# Patient Record
Sex: Female | Born: 1995 | Race: White | Hispanic: No | Smoking: Former smoker
Health system: Southern US, Community
[De-identification: ages and names within clinical notes are randomized; demographics above are authoritative.]

## PROBLEM LIST (undated history)

## (undated) ENCOUNTER — Inpatient Hospital Stay: Payer: Self-pay

## (undated) DIAGNOSIS — J309 Allergic rhinitis, unspecified: Secondary | ICD-10-CM

## (undated) DIAGNOSIS — F17210 Nicotine dependence, cigarettes, uncomplicated: Secondary | ICD-10-CM

## (undated) DIAGNOSIS — F329 Major depressive disorder, single episode, unspecified: Secondary | ICD-10-CM

## (undated) DIAGNOSIS — I10 Essential (primary) hypertension: Secondary | ICD-10-CM

## (undated) DIAGNOSIS — G4700 Insomnia, unspecified: Secondary | ICD-10-CM

## (undated) DIAGNOSIS — F419 Anxiety disorder, unspecified: Secondary | ICD-10-CM

## (undated) DIAGNOSIS — N939 Abnormal uterine and vaginal bleeding, unspecified: Secondary | ICD-10-CM

## (undated) DIAGNOSIS — Z720 Tobacco use: Secondary | ICD-10-CM

## (undated) DIAGNOSIS — D649 Anemia, unspecified: Secondary | ICD-10-CM

## (undated) DIAGNOSIS — F32A Depression, unspecified: Secondary | ICD-10-CM

## (undated) DIAGNOSIS — F4322 Adjustment disorder with anxiety: Secondary | ICD-10-CM

## (undated) HISTORY — DX: Insomnia, unspecified: G47.00

## (undated) HISTORY — DX: Adjustment disorder with anxiety: F43.22

## (undated) HISTORY — DX: Nicotine dependence, cigarettes, uncomplicated: F17.210

## (undated) HISTORY — DX: Abnormal uterine and vaginal bleeding, unspecified: N93.9

## (undated) HISTORY — DX: Depression, unspecified: F32.A

## (undated) HISTORY — DX: Major depressive disorder, single episode, unspecified: F32.9

## (undated) HISTORY — PX: NO PAST SURGERIES: SHX2092

## (undated) HISTORY — PX: WISDOM TOOTH EXTRACTION: SHX21

## (undated) HISTORY — DX: Allergic rhinitis, unspecified: J30.9

## (undated) HISTORY — DX: Tobacco use: Z72.0

## (undated) HISTORY — DX: Anxiety disorder, unspecified: F41.9

---
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
## 2015-01-27 ENCOUNTER — Telehealth: Payer: Self-pay | Admitting: Obstetrics and Gynecology

## 2015-01-27 NOTE — Telephone Encounter (Signed)
Please inform pt that she should have refills on her Depo, she just needs to call the pharmacy. I sent RX in on 10/06/2014 with 3 refills. Thanks

## 2015-01-27 NOTE — Telephone Encounter (Signed)
Patient called requesting [facility] dep[facility] [address].Thanks

## 2015-01-28 NOTE — Telephone Encounter (Signed)
Patient notified

## 2015-02-05 ENCOUNTER — Telehealth: Payer: Self-pay | Admitting: Obstetrics and Gynecology

## 2015-02-05 NOTE — Telephone Encounter (Signed)
PT CALLED AND SHE HAD BEEN ON DEPO FOR ABOUT TWO YEARS, AND ABOUT A MONTH AGO SHE STARTED SPOTTING AND IT STOPPED AND SHE STARTED BLEEDING REALLY HEAVY TODAY 02/05/15, AND SHE IS NOT SCHEDULE TO COME IN UNTIL 7/18, SHE TOOK PREGNANCY TEST AND ITS NEGATIVE, AND SHE IS JUST CONCERNED BECAUSE THIS HAS STARTED ALL OF A SUDDEN. PT WOULD LIKE A CALL BACK.

## 2015-02-11 NOTE — Telephone Encounter (Signed)
Called and left several messages. Pt has yet to return my call.

## 2015-02-16 ENCOUNTER — Ambulatory Visit (INDEPENDENT_AMBULATORY_CARE_PROVIDER_SITE_OTHER): Payer: Medicaid Other | Admitting: Obstetrics and Gynecology

## 2015-02-16 VITALS — BP 113/76 | HR 78 | Ht 61.5 in | Wt 124.3 lb

## 2015-02-16 DIAGNOSIS — F418 Other specified anxiety disorders: Secondary | ICD-10-CM | POA: Diagnosis not present

## 2015-02-16 DIAGNOSIS — Z3042 Encounter for surveillance of injectable contraceptive: Secondary | ICD-10-CM

## 2015-02-16 DIAGNOSIS — F329 Major depressive disorder, single episode, unspecified: Secondary | ICD-10-CM

## 2015-02-16 DIAGNOSIS — F32A Depression, unspecified: Secondary | ICD-10-CM | POA: Insufficient documentation

## 2015-02-16 DIAGNOSIS — F419 Anxiety disorder, unspecified: Secondary | ICD-10-CM

## 2015-02-16 MED ORDER — MEDROXYPROGESTERONE ACETATE 150 MG/ML IM SUSP
150.0000 mg | Freq: Once | INTRAMUSCULAR | Status: AC
  Administered 2015-02-16: 150 mg via INTRAMUSCULAR

## 2015-02-16 NOTE — Patient Instructions (Signed)
Pt to f/u in 3 months (October 3-17th)

## 2015-02-16 NOTE — Progress Notes (Cosign Needed)
Last Depo-Provera: 5/282016 Side Effects if any: Pt notes some spotting x 1 day, advised pt this was normal with Depo-Provera Serum HCG indicated? No, pt within allotted window Depo-Provera 150 mg IM given by: Debbe [place] Hildur Bayer, CMA (RT Buttocks) Next appointment due October 3- October 17 Pt tolerated injection well. Pt supplied.

## 2015-03-06 NOTE — Progress Notes (Signed)
I have reviewed the documentation and agree with assessment.

## 2015-03-19 ENCOUNTER — Encounter: Payer: Self-pay | Admitting: Emergency Medicine

## 2015-03-19 ENCOUNTER — Ambulatory Visit
Admission: EM | Admit: 2015-03-19 | Discharge: 2015-03-19 | Disposition: A | Discharge status: Home / Self Care | Payer: Medicaid Other | Attending: Family Medicine | Admitting: Family Medicine

## 2015-03-19 DIAGNOSIS — B084 Enteroviral vesicular stomatitis with exanthem: Secondary | ICD-10-CM | POA: Insufficient documentation

## 2015-03-19 DIAGNOSIS — J029 Acute pharyngitis, unspecified: Secondary | ICD-10-CM

## 2015-03-19 DIAGNOSIS — R21 Rash and other nonspecific skin eruption: Secondary | ICD-10-CM | POA: Diagnosis present

## 2015-03-19 LAB — RAPID STREP SCREEN (MED CTR MEBANE ONLY): STREPTOCOCCUS, GROUP A SCREEN (DIRECT): NEGATIVE

## 2015-03-19 NOTE — Discharge Instructions (Signed)

## 2015-03-19 NOTE — ED Notes (Signed)
Pt with blisters on hands and feet

## 2015-03-19 NOTE — ED Provider Notes (Signed)
CSN: [id number]     Arrival date & time 03/19/15  1116 History   First MD Initiated Contact with Patient 03/19/15 1144     Chief Complaint  Patient presents with  . Blister   Patient states being exposed to her significant other to young nephews who have had foot mouth disease recently. (Consider location/radiation/quality/duration/timing/severity/associated sxs/prior Treatment) Patient is a 19 y.o. female presenting with rash. The history is provided by the patient and a friend. No language interpreter was used.  Rash Location:  Foot, hand and mouth Mouth rash location:  Lower gingiva Hand rash location:  R palm Foot rash location:  Sole of L foot and sole of R foot Quality: blistering, painful, redness and swelling   Quality: not scaling   Pain details:    Quality:  Sharp   Onset quality:  Sudden   Duration:  3 days   Timing:  Constant   Progression:  Worsening Onset quality:  Gradual Duration:  3 days Timing:  Constant Progression:  Worsening Chronicity:  New Context: sick contacts   Context: not hot tub use and not plant contact   Relieved by:  Nothing Worsened by:  Nothing tried Associated symptoms: sore throat and throat swelling   Associated symptoms: no abdominal pain      Past Medical History  Diagnosis Date  . Anxiety   . Depression   . Cigarette smoker    Past Surgical History  Procedure Laterality Date  . Wisdom tooth extraction     Family History  Problem Relation Age of Onset  . Lupus Mother   . Diabetes Father   . Breast cancer Maternal Grandmother   . Diabetes Paternal Grandmother   . Diabetes Paternal Grandfather    Social History  Substance Use Topics  . Smoking status: Current Every Day Smoker -- 0.50 packs/day for 1 years    Types: Cigarettes  . Smokeless tobacco: Never Used  . Alcohol Use: None   OB History    Gravida Para Term Preterm AB TAB SAB Ectopic Multiple Living       Review of Systems  HENT: Positive  for sore throat.   Gastrointestinal: Negative for abdominal pain.  Skin: Positive for rash.  All other systems reviewed and are negative.  Patient denies any medical problems but does smoke and was warned that she needs to stop smoking. Allergies  Review of patient's allergies indicates no known allergies.  Home Medications   Prior to Admission medications   Medication Sig Start Date End Date Taking? Authorizing Provider  escitalopram (LEXAPRO) 20 MG tablet Take 20 mg by mouth daily.   Yes Historical Provider, MD  medroxyPROGESTERone (DEPO-PROVERA) 150 MG/ML injection Inject 150 mg into the muscle every 3 (three) months.    Historical Provider, MD   BP 134/94 mmHg  Pulse 87  Temp(Src) 98.4 F (36.9 C) (Tympanic)  Resp 18  Ht  (1.575 m)  Wt 121 lb (54.885 kg)  BMI 22.13 kg/m2  SpO2 99% Physical Exam  Constitutional: She is oriented to person, place, and time. She appears well-developed and well-nourished.  HENT:  Head: Normocephalic.  Eyes: Pupils are equal, round, and reactive to light.  Neck: Normal range of motion. Neck supple.  Lymphadenopathy:    She has cervical adenopathy.  Neurological: She is alert and oriented to person, place, and time.  Skin: Skin is warm. Rash noted.  Psychiatric: She has a normal mood and affect. Her  behavior is normal.  Vitals reviewed.   ED Course  Procedures (including critical care time) Labs Review Labs Reviewed  RAPID STREP SCREEN (NOT AT [facility])  CULTURE, GROUP A STREP ([facility] ONLY)    Imaging Review No results found.   MDM   1. Hand, foot and mouth disease   2. Pharyngitis      Hand-foot-and-mouth disease Pharyngitis No antibodies will be prescribed unless strep culture is positive. Work note will be given for the next few days as well.    Hassan Rowan, MD 03/20/15 1450

## 2015-03-23 LAB — CULTURE, GROUP A STREP (THRC)

## 2015-05-15 DIAGNOSIS — F4322 Adjustment disorder with anxiety: Secondary | ICD-10-CM | POA: Insufficient documentation

## 2015-05-15 DIAGNOSIS — J309 Allergic rhinitis, unspecified: Secondary | ICD-10-CM | POA: Insufficient documentation

## 2015-05-15 DIAGNOSIS — Z720 Tobacco use: Secondary | ICD-10-CM | POA: Insufficient documentation

## 2015-05-15 DIAGNOSIS — G4700 Insomnia, unspecified: Secondary | ICD-10-CM | POA: Insufficient documentation

## 2015-05-15 DIAGNOSIS — R5383 Other fatigue: Secondary | ICD-10-CM | POA: Insufficient documentation

## 2015-05-19 ENCOUNTER — Ambulatory Visit: Payer: Medicaid Other

## 2015-06-02 ENCOUNTER — Encounter: Payer: Self-pay | Admitting: Family Medicine

## 2015-06-02 ENCOUNTER — Ambulatory Visit (INDEPENDENT_AMBULATORY_CARE_PROVIDER_SITE_OTHER): Payer: Self-pay | Admitting: Obstetrics and Gynecology

## 2015-06-02 ENCOUNTER — Ambulatory Visit (INDEPENDENT_AMBULATORY_CARE_PROVIDER_SITE_OTHER): Payer: Medicaid Other | Admitting: Family Medicine

## 2015-06-02 ENCOUNTER — Encounter: Payer: Self-pay | Admitting: Obstetrics and Gynecology

## 2015-06-02 VITALS — BP 120/83 | HR 98 | Ht 62.0 in | Wt 129.4 lb

## 2015-06-02 VITALS — BP 116/86 | HR 96 | Temp 98.2°F | Ht 62.0 in | Wt 126.0 lb

## 2015-06-02 DIAGNOSIS — Z23 Encounter for immunization: Secondary | ICD-10-CM | POA: Diagnosis not present

## 2015-06-02 DIAGNOSIS — R1013 Epigastric pain: Secondary | ICD-10-CM | POA: Diagnosis not present

## 2015-06-02 DIAGNOSIS — Z0000 Encounter for general adult medical examination without abnormal findings: Secondary | ICD-10-CM

## 2015-06-02 DIAGNOSIS — Z720 Tobacco use: Secondary | ICD-10-CM

## 2015-06-02 DIAGNOSIS — Z840 Family history of diseases of the skin and subcutaneous tissue: Secondary | ICD-10-CM

## 2015-06-02 DIAGNOSIS — Z828 Family history of other disabilities and chronic diseases leading to disablement, not elsewhere classified: Secondary | ICD-10-CM | POA: Diagnosis not present

## 2015-06-02 DIAGNOSIS — Z113 Encounter for screening for infections with a predominantly sexual mode of transmission: Secondary | ICD-10-CM

## 2015-06-02 DIAGNOSIS — Z3042 Encounter for surveillance of injectable contraceptive: Secondary | ICD-10-CM

## 2015-06-02 LAB — POCT URINE PREGNANCY: PREG TEST UR: NEGATIVE

## 2015-06-02 MED ORDER — MEDROXYPROGESTERONE ACETATE 150 MG/ML IM SUSP
150.0000 mg | Freq: Once | INTRAMUSCULAR | Status: AC
  Administered 2015-06-02: 150 mg via INTRAMUSCULAR

## 2015-06-02 MED ORDER — OMEPRAZOLE 20 MG PO CPDR: 20.0000 mg | DELAYED_RELEASE_CAPSULE | Freq: Every day | ORAL | Status: DC

## 2015-06-02 NOTE — Assessment & Plan Note (Signed)
Advised her to work on [employer] diet; avoid triggers; use PPI for just two weeks

## 2015-06-02 NOTE — Patient Instructions (Addendum)
DASH Eating Plan DASH stands for "Dietary Approaches to Stop Hypertension." The DASH eating plan is a healthy eating plan that has been shown to reduce high blood pressure (hypertension). Additional health benefits may include reducing the risk of type 2 diabetes mellitus, heart disease, and stroke. The DASH eating plan may also help with weight loss. WHAT DO I NEED TO KNOW ABOUT THE DASH EATING PLAN? For the DASH eating plan, you will follow these general guidelines:  Choose foods with a percent daily value for sodium of less than 5% (as listed on the food label).  Use salt-free seasonings or herbs instead of table salt or sea salt.  Check with your health care provider or pharmacist before using salt substitutes.  Eat lower-sodium products, often labeled as "lower sodium" or "no salt added."  Eat fresh foods.  Eat more vegetables, fruits, and low-fat dairy products.  Choose whole grains. Look for the word "whole" as the first word in the ingredient list.  Choose fish and skinless chicken or [place] more often than red meat. Limit fish, poultry, and meat to 6 oz (170 g) each day.  Limit sweets, desserts, sugars, and sugary drinks.  Choose heart-healthy fats.  Limit cheese to 1 oz (28 g) per day.  Eat more home-cooked food and less restaurant, buffet, and fast food.  Limit fried foods.  Cook foods using methods other than frying.  Limit canned vegetables. If you do use them, rinse them well to decrease the sodium.  When eating at a restaurant, ask that your food be prepared with less salt, or no salt if possible. WHAT FOODS CAN I EAT? Seek help from a dietitian for individual calorie needs. Grains Whole grain or whole wheat bread. Brown rice. Whole grain or whole wheat pasta. Quinoa, bulgur, and whole grain cereals. Low-sodium cereals. Corn or whole wheat flour tortillas. Whole grain cornbread. Whole grain crackers. Low-sodium crackers. Vegetables Fresh or frozen vegetables  (raw, steamed, roasted, or grilled). Low-sodium or reduced-sodium tomato and vegetable juices. Low-sodium or reduced-sodium tomato sauce and paste. Low-sodium or reduced-sodium canned vegetables.  Fruits All fresh, canned (in natural juice), or frozen fruits. Meat and Other Protein Products Ground beef (85% or leaner), grass-fed beef, or beef trimmed of fat. Skinless chicken or [place]. Ground chicken or [place]. Pork trimmed of fat. All fish and seafood. Eggs. Dried beans, peas, or lentils. Unsalted nuts and seeds. Unsalted canned beans. Dairy Low-fat dairy products, such as skim or 1% milk, 2% or reduced-fat cheeses, low-fat ricotta or cottage cheese, or plain low-fat yogurt. Low-sodium or reduced-sodium cheeses. Fats and Oils Tub margarines without trans fats. Light or reduced-fat mayonnaise and salad dressings (reduced sodium). Avocado. Safflower, olive, or canola oils. Natural peanut or almond butter. Other Unsalted popcorn and pretzels. The items listed above may not be a complete list of recommended foods or beverages. Contact your dietitian for more options. WHAT FOODS ARE NOT RECOMMENDED? Grains White bread. White pasta. White rice. Refined cornbread. Bagels and croissants. Crackers that contain trans fat. Vegetables Creamed or fried vegetables. Vegetables in a cheese sauce. Regular canned vegetables. Regular canned tomato sauce and paste. Regular tomato and vegetable juices. Fruits Dried fruits. Canned fruit in light or heavy syrup. Fruit juice. Meat and Other Protein Products Fatty cuts of meat. Ribs, chicken wings, bacon, sausage, bologna, salami, chitterlings, fatback, hot dogs, bratwurst, and packaged luncheon meats. Salted nuts and seeds. Canned beans with salt. Dairy Whole or 2% milk, cream, half-and-half, and cream cheese. Whole-fat or sweetened yogurt. Full-fat  cheeses or blue cheese. Nondairy creamers and whipped toppings. Processed cheese, cheese spreads, or cheese  curds. Condiments Onion and garlic salt, seasoned salt, table salt, and sea salt. Canned and packaged gravies. Worcestershire sauce. Tartar sauce. Barbecue sauce. Teriyaki sauce. Soy sauce, including reduced sodium. Steak sauce. Fish sauce. Oyster sauce. Cocktail sauce. Horseradish. Ketchup and mustard. Meat flavorings and tenderizers. Bouillon cubes. Hot sauce. Tabasco sauce. Marinades. Taco seasonings. Relishes. Fats and Oils Butter, stick margarine, lard, shortening, ghee, and bacon fat. Coconut, palm kernel, or palm oils. Regular salad dressings. Other Pickles and olives. Salted popcorn and pretzels. The items listed above may not be a complete list of foods and beverages to avoid. Contact your dietitian for more information. WHERE CAN I FIND MORE INFORMATION? National Heart, Lung, and [employer] Institute: www.nhlbi.nih.gov/health/health-topics/topics/dash/   This information is not intended to replace advice given to you by your health care provider. Make sure you discuss any questions you have with your health care provider.   Document Released: 07/07/2011 Document Revised: 08/08/2014 Document Reviewed: 05/22/2013 Elsevier Interactive Patient Education 2016 [employer] Health Maintenance, Female Adopting a healthy lifestyle and getting preventive care can go a long way to promote health and wellness. Talk with your health care provider about what schedule of regular examinations is right for you. This is a good chance for you to check in with your provider about disease prevention and staying healthy. In between checkups, there are plenty of things you can do on your own. Experts have done a lot of research about which lifestyle changes and preventive measures are most likely to keep you healthy. Ask your health care provider for more information. WEIGHT AND DIET  Eat a healthy diet  Be sure to include plenty of vegetables, fruits, low-fat dairy products, and lean protein.  Do not eat a  lot of foods high in solid fats, added sugars, or salt.  Get regular exercise. This is one of the most important things you can do for your health.  Most adults should exercise for at least 150 minutes each week. The exercise should increase your heart rate and make you sweat (moderate-intensity exercise).  Most adults should also do strengthening exercises at least twice a week. This is in addition to the moderate-intensity exercise.  Maintain a healthy weight  Body mass index (BMI) is a measurement that can be used to identify possible weight problems. It estimates body fat based on height and weight. Your health care provider can help determine your BMI and help you achieve or maintain a healthy weight.  For females 20 years of age and older:   A BMI below 18.5 is considered underweight.  A BMI of 18.5 to 24.9 is normal.  A BMI of 25 to 29.9 is considered overweight.  A BMI of 30 and above is considered obese.  Watch levels of cholesterol and blood lipids  You should start having your blood tested for lipids and cholesterol at 20 years of age, then have this test every 5 years.  You may need to have your cholesterol levels checked more often if:  Your lipid or cholesterol levels are high.  You are older than 19 years of age.  You are at high risk for heart disease.  CANCER SCREENING   Lung Cancer  Lung cancer screening is recommended for adults 55-80 years old who are at high risk for lung cancer because of a history of smoking.  A yearly low-dose CT scan of the lungs is recommended for people   who:  Currently smoke.  Have quit within the past 15 years.  Have at least a 30-pack-year history of smoking. A pack year is smoking an average of one pack of cigarettes a day for 1 year.  Yearly screening should continue until it has been 15 years since you quit.  Yearly screening should stop if you develop a health problem that would prevent you from having lung cancer  treatment.  Breast Cancer  Practice breast self-awareness. This means understanding how your breasts normally appear and feel.  It also means doing regular breast self-exams. Let your health care provider know about any changes, no matter how small.  If you are in your 20s or 30s, you should have a clinical breast exam (CBE) by a health care provider every 1-3 years as part of a regular health exam.  If you are 67 or older, have a CBE every year. Also consider having a breast X-ray (mammogram) every year.  If you have a family history of breast cancer, talk to your health care provider about genetic screening.  If you are at high risk for breast cancer, talk to your health care provider about having an MRI and a mammogram every year.  Breast cancer gene (BRCA) assessment is recommended for women who have family members with BRCA-related cancers. BRCA-related cancers include:  Breast.  Ovarian.  Tubal.  Peritoneal cancers.  Results of the assessment will determine the need for genetic counseling and BRCA1 and BRCA2 testing. Cervical Cancer Your health care provider may recommend that you be screened regularly for cancer of the pelvic organs (ovaries, uterus, and vagina). This screening involves a pelvic examination, including checking for microscopic changes to the surface of your cervix (Pap test). You may be encouraged to have this screening done every 3 years, beginning at age 19.  For women ages 19-65, health care providers may recommend pelvic exams and Pap testing every 3 years, or they may recommend the Pap and pelvic exam, combined with testing for human papilloma virus (HPV), every 5 years. Some types of HPV increase your risk of cervical cancer. Testing for HPV may also be done on women of any age with unclear Pap test results.  Other health care providers may not recommend any screening for nonpregnant women who are considered low risk for pelvic cancer and who do not have  symptoms. Ask your health care provider if a screening pelvic exam is right for you.  If you have had past treatment for cervical cancer or a condition that could lead to cancer, you need Pap tests and screening for cancer for at least 20 years after your treatment. If Pap tests have been discontinued, your risk factors (such as having a new sexual partner) need to be reassessed to determine if screening should resume. Some women have medical problems that increase the chance of getting cervical cancer. In these cases, your health care provider may recommend more frequent screening and Pap tests. Colorectal Cancer  This type of cancer can be detected and often prevented.  Routine colorectal cancer screening usually begins at 19 years of age and continues through 19 years of age.  Your health care provider may recommend screening at an earlier age if you have risk factors for colon cancer.  Your health care provider may also recommend using home test kits to check for hidden blood in the stool.  A small camera at the end of a tube can be used to examine your colon directly (sigmoidoscopy or colonoscopy).  This is done to check for the earliest forms of colorectal cancer.  Routine screening usually begins at age 2.  Direct examination of the colon should be repeated every 5-10 years through 19 years of age. However, you may need to be screened more often if early forms of precancerous polyps or small growths are found. Skin Cancer  Check your skin from head to toe regularly.  Tell your health care provider about any new moles or changes in moles, especially if there is a change in a mole's shape or color.  Also tell your health care provider if you have a mole that is larger than the size of a pencil eraser.  Always use sunscreen. Apply sunscreen liberally and repeatedly throughout the day.  Protect yourself by wearing long sleeves, pants, a wide-brimmed hat, and sunglasses whenever you are  outside. HEART DISEASE, DIABETES, AND HIGH BLOOD PRESSURE   High blood pressure causes heart disease and increases the risk of stroke. High blood pressure is more likely to develop in:  People who have blood pressure in the high end of the normal range (130-139/85-89 mm Hg).  People who are overweight or obese.  People who are African American.  If you are 64-51 years of age, have your blood pressure checked every 3-5 years. If you are 76 years of age or older, have your blood pressure checked every year. You should have your blood pressure measured twice--once when you are at a hospital or clinic, and once when you are not at a hospital or clinic. Record the average of the two measurements. To check your blood pressure when you are not at a hospital or clinic, you can use:  An automated blood pressure machine at a pharmacy.  A home blood pressure monitor.  If you are between 42 years and 25 years old, ask your health care provider if you should take aspirin to prevent strokes.  Have regular diabetes screenings. This involves taking a blood sample to check your fasting blood sugar level.  If you are at a normal weight and have a low risk for diabetes, have this test once every three years after 19 years of age.  If you are overweight and have a high risk for diabetes, consider being tested at a younger age or more often. PREVENTING INFECTION  Hepatitis B  If you have a higher risk for hepatitis B, you should be screened for this virus. You are considered at high risk for hepatitis B if:  You were born in a country where hepatitis B is common. Ask your health care provider which countries are considered high risk.  Your parents were born in a high-risk country, and you have not been immunized against hepatitis B (hepatitis B vaccine).  You have HIV or AIDS.  You use needles to inject street drugs.  You live with someone who has hepatitis B.  You have had sex with someone who has  hepatitis B.  You get hemodialysis treatment.  You take certain medicines for conditions, including cancer, organ transplantation, and autoimmune conditions. Hepatitis C  Blood testing is recommended for:  Everyone born from [birth year] through 1965.  Anyone with known risk factors for hepatitis C. Sexually transmitted infections (STIs)  You should be screened for sexually transmitted infections (STIs) including gonorrhea and chlamydia if:  You are sexually active and are younger than 19 years of age.  You are older than 19 years of age and your health care provider tells you that you are  at risk for this type of infection.  Your sexual activity has changed since you were last screened and you are at an increased risk for chlamydia or gonorrhea. Ask your health care provider if you are at risk.  If you do not have HIV, but are at risk, it may be recommended that you take a prescription medicine daily to prevent HIV infection. This is called pre-exposure prophylaxis (PrEP). You are considered at risk if:  You are sexually active and do not regularly use condoms or know the HIV status of your partner(s).  You take drugs by injection.  You are sexually active with a partner who has HIV. Talk with your health care provider about whether you are at high risk of being infected with HIV. If you choose to begin PrEP, you should first be tested for HIV. You should then be tested every 3 months for as long as you are taking PrEP.  PREGNANCY   If you are premenopausal and you may become pregnant, ask your health care provider about preconception counseling.  If you may become pregnant, take 400 to 800 micrograms (mcg) of folic acid every day.  If you want to prevent pregnancy, talk to your health care provider about birth control (contraception). OSTEOPOROSIS AND MENOPAUSE   Osteoporosis is a disease in which the bones lose minerals and strength with aging. This can result in serious bone  fractures. Your risk for osteoporosis can be identified using a bone density scan.  If you are 40 years of age or older, or if you are at risk for osteoporosis and fractures, ask your health care provider if you should be screened.  Ask your health care provider whether you should take a calcium or vitamin D supplement to lower your risk for osteoporosis.  Menopause may have certain physical symptoms and risks.  Hormone replacement therapy may reduce some of these symptoms and risks. Talk to your health care provider about whether hormone replacement therapy is right for you.  HOME CARE INSTRUCTIONS   Schedule regular health, dental, and eye exams.  Stay current with your immunizations.   Do not use any tobacco products including cigarettes, chewing tobacco, or electronic cigarettes.  If you are pregnant, do not drink alcohol.  If you are breastfeeding, limit how much and how often you drink alcohol.  Limit alcohol intake to no more than 1 drink per day for nonpregnant women. One drink equals 12 ounces of beer, 5 ounces of wine, or 1 ounces of hard liquor.  Do not use street drugs.  Do not share needles.  Ask your health care provider for help if you need support or information about quitting drugs.  Tell your health care provider if you often feel depressed.  Tell your health care provider if you have ever been abused or do not feel safe at home.   This information is not intended to replace advice given to you by your health care provider. Make sure you discuss any questions you have with your health care provider.   Document Released: 01/31/2011 Document Revised: 08/08/2014 Document Reviewed: 06/19/2013 Elsevier Interactive Patient Education [employer].  I do recommend Gardisil, the vaccine series to help prevent cervical cancer; you have until age 59 Please do improve your diet You received the flu shot today; it should protect you against the flu virus over the  coming months; it will take about two weeks for antibodies to develop; do try to stay away from hospitals, nursing homes, and  daycares during peak flu season; taking 1000 mg of vitamin C daily during flu season may help you avoid getting sick Return in 12+ months for next physical Use the new medicine for two weeks as you work on improving your diet to help with spasms and dyspepsia Please do STOP smoking   Smoking Cessation, Tips for Success If you are ready to quit smoking, congratulations! You have chosen to help yourself be healthier. Cigarettes bring nicotine, tar, carbon monoxide, and other irritants into your body. Your lungs, heart, and blood vessels will be able to work better without these poisons. There are many different ways to quit smoking. Nicotine gum, nicotine patches, a nicotine inhaler, or nicotine nasal spray can help with physical craving. Hypnosis, support groups, and medicines help break the habit of smoking. WHAT THINGS CAN I DO TO MAKE QUITTING EASIER?  Here are some tips to help you quit for good:  Pick a date when you will quit smoking completely. Tell all of your friends and family about your plan to quit on that date.  Do not try to slowly cut down on the number of cigarettes you are smoking. Pick a quit date and quit smoking completely starting on that day.  Throw away all cigarettes.   Clean and remove all ashtrays from your home, work, and car.  On a card, write down your reasons for quitting. Carry the card with you and read it when you get the urge to smoke.  Cleanse your body of nicotine. Drink enough water and fluids to keep your urine clear or pale yellow. Do this after quitting to flush the nicotine from your body.  Learn to predict your moods. Do not let a bad situation be your excuse to have a cigarette. Some situations in your life might tempt you into wanting a cigarette.  Never have "just one" cigarette. It leads to wanting another and another.  Remind yourself of your decision to quit.  Change habits associated with smoking. If you smoked while driving or when feeling stressed, try other activities to replace smoking. Stand up when drinking your coffee. Brush your teeth after eating. Sit in a different chair when you read the paper. Avoid alcohol while trying to quit, and try to drink fewer caffeinated beverages. Alcohol and caffeine may urge you to smoke.  Avoid foods and drinks that can trigger a desire to smoke, such as sugary or spicy foods and alcohol.  Ask people who smoke not to smoke around you.  Have something planned to do right after eating or having a cup of coffee. For example, plan to take a walk or exercise.  Try a relaxation exercise to calm you down and decrease your stress. Remember, you may be tense and nervous for the first 2 weeks after you quit, but this will pass.  Find new activities to keep your hands busy. Play with a pen, coin, or rubber band. Doodle or draw things on paper.  Brush your teeth right after eating. This will help cut down on the craving for the taste of tobacco after meals. You can also try mouthwash.   Use oral substitutes in place of cigarettes. Try using lemon drops, carrots, cinnamon sticks, or chewing gum. Keep them handy so they are available when you have the urge to smoke.  When you have the urge to smoke, try deep breathing.  Designate your home as a nonsmoking area.  If you are a heavy smoker, ask your health care provider about a  prescription for nicotine chewing gum. It can ease your withdrawal from nicotine.  Reward yourself. Set aside the cigarette money you save and buy yourself something nice.  Look for support from others. Join a support group or smoking cessation program. Ask someone at home or at work to help you with your plan to quit smoking.  Always ask yourself, "Do I need this cigarette or is this just a reflex?" Tell yourself, "Today, I choose not to smoke," or  "I do not want to smoke." You are reminding yourself of your decision to quit.  Do not replace cigarette smoking with electronic cigarettes (commonly called e-cigarettes). The safety of e-cigarettes is unknown, and some may contain harmful chemicals.  If you relapse, do not give up! Plan ahead and think about what you will do the next time you get the urge to smoke. HOW WILL I FEEL WHEN I QUIT SMOKING? You may have symptoms of withdrawal because your body is used to nicotine (the addictive substance in cigarettes). You may crave cigarettes, be irritable, feel very hungry, cough often, get headaches, or have difficulty concentrating. The withdrawal symptoms are only temporary. They are strongest when you first quit but will go away within 10-14 days. When withdrawal symptoms occur, stay in control. Think about your reasons for quitting. Remind yourself that these are signs that your body is healing and getting used to being without cigarettes. Remember that withdrawal symptoms are easier to treat than the major diseases that smoking can cause.  Even after the withdrawal is over, expect periodic urges to smoke. However, these cravings are generally short lived and will go away whether you smoke or not. Do not smoke! WHAT RESOURCES ARE AVAILABLE TO HELP ME QUIT SMOKING? Your health care provider can direct you to community resources or hospitals for support, which may include:  Group support.  Education.  Hypnosis.  Therapy.   This information is not intended to replace advice given to you by your health care provider. Make sure you discuss any questions you have with your health care provider.   Document Released: 04/15/2004 Document Revised: 08/08/2014 Document Reviewed: 01/03/2013 Elsevier Interactive Patient Education 2016 [employer].  Caution: prolonged use of proton pump inhibitors like omeprazole (Prilosec), pantoprazole (Protonix), esomeprazole (Nexium), and others like Dexilant and  Aciphex may increase your risk of pneumonia, Clostridium difficile colitis, osteoporosis, anemia and other health complications Try to limit or avoid triggers like coffee, caffeinated beverages, onions, chocolate, spicy foods, peppermint, acid foods like pizza, spaghetti sauce, and orange juice Lose weight if you are overweight or obese Try elevating the head of your bed by placing a small wedge between your mattress and box springs to keep acid in the stomach at night instead of coming up into your esophagus

## 2015-06-02 NOTE — Assessment & Plan Note (Addendum)
Encouraged her to quit smoking; cited leukemia nad cervica cancer and wrinkles in addition to other health problems like emphysema and heart disease and lung cancer; see AVS

## 2015-06-02 NOTE — Assessment & Plan Note (Signed)
USPSTF grade A and B recommendations reviewed with patient; age-appropriate recommendations, preventive care, screening tests, etc discussed and encouraged; healthy living encouraged; see AVS for patient education given to patient; she gets GYN care through Dr. Oretha Milchherry's office; I did not check STDs today because patient says those are checked through GYN office; encoruaged her to consider HPV vaccine; flu vaccine given today; quit smoking; better diet, activity

## 2015-06-02 NOTE — Progress Notes (Signed)
Patient ID: Tamara Bishop, female   DOB: 1996-03-23, 19 y.o.   MRN: [id number]   Subjective:   Tamara Bishop is a 19 y.o. female here for a complete physical exam  Interim issues since last visit: did have hand foot mouth disease a month ago She has been meaning to call and ask; feels like muscle twitches in the upper abdomen; she has it off and on; going on for a month or so; no twitches in legs or eyelids; she can feel it in her stomach; having some stomach acid and heartburn; just happens all the time; she uses Pepto-Bismol Her mother died of lupus in her 53s (patient was only 10) and her sister now has lupus at age 45; sister lives in [place]  USPSTF grade A and B recommendations Alcohol: no Depression: Depression screen [facility] 2/9 06/02/2015  Decreased Interest 0  Down, Depressed, Hopeless 0  PHQ - 2 Score 0   Hypertension: diastolic 86 today; ate something salty yesterday Obesity: no Tobacco use: smokes; not interested in quitting right now HIV, hep B, hep C: offered, gyn did already STD testing and prevention (chl/gon/syphilis): gyn tested that too Lipids: today Glucose: today Colorectal cancer: no fam hx Breast cancer: sees gyn; no lumps BRCA gene screening: grandmother had breast cancer in her late 41s; no ovarian cancer Intimate partner violence: no Cervical cancer screening: done by gyn Diet: eats "a lot of crap"; eats fast food Exercise: does not sit at all; no regular exercise Skin cancer: used to use tanning beds; does not wear sun screen  Past Medical History  Diagnosis Date  . Anxiety   . Depression   . Cigarette smoker   . Adjustment disorder with anxiety   . Allergic rhinitis   . Insomnia   . Tobacco abuse    Past Surgical History  Procedure Laterality Date  . Wisdom tooth extraction     Family History  Problem Relation Age of Onset  . Lupus Mother   . Diabetes Father   . Breast cancer Maternal Grandmother   . Cancer Maternal Grandmother       breast  . Diabetes Paternal Grandmother   . Diabetes Paternal Grandfather   . Lupus Sister   . Heart disease Maternal Grandfather    Social History  Substance Use Topics  . Smoking status: Current Every Day Smoker -- 0.50 packs/day for 1 years    Types: Cigarettes  . Smokeless tobacco: Never Used  . Alcohol Use: No   Review of Systems  Constitutional: Negative for fever and unexpected weight change.  HENT: Positive for ear pain (behind the left ear 2-3 weeks). Negative for ear discharge, sinus pressure and sore throat.   Eyes: Positive for visual disturbance (she should get glasses but medicaid doesn't cover she thinks, but she will call and ask).  Respiratory: Negative for shortness of breath and wheezing.   Cardiovascular: Negative for chest pain, palpitations and leg swelling.  Gastrointestinal: Negative for abdominal pain.  Endocrine: Negative for polydipsia and polyuria.  Genitourinary: Negative for vaginal bleeding.  Musculoskeletal: Positive for back pain (because she stands all day). Negative for arthralgias.  Skin:       No worrisome moles  Allergic/Immunologic: Negative for food allergies.  Neurological: Negative for tremors.  Hematological: Negative for adenopathy.  Psychiatric/Behavioral: Negative for dysphoric mood.    Objective:   Filed Vitals:   06/02/15 0906  BP: 116/86  Pulse: 96  Temp: 98.2 F (36.8 C)  Height: $Remove'5\' 2"'ufnODtg$  (  1.575 m)  Weight: 126 lb (57.153 kg)  SpO2: 99%   Body mass index is 23.04 kg/(m^2). Wt Readings from Last 3 Encounters:  06/02/15 126 lb (57.153 kg) (48 %*, Z = -0.05)  09/03/14 126 lb (57.153 kg) (52 %*, Z = 0.04)  03/19/15 121 lb (54.885 kg) (39 %*, Z = -0.28)   * Growth percentiles are based on CDC 2-20 Years data.   Physical Exam  Constitutional: She appears well-developed and well-nourished.  HENT:  Head: Normocephalic and atraumatic.  Right Ear: Hearing, tympanic membrane, external ear and ear canal normal.  Left Ear:  Hearing, tympanic membrane, external ear and ear canal normal.  Eyes: Conjunctivae and EOM are normal. Right eye exhibits no hordeolum. Left eye exhibits no hordeolum. No scleral icterus.  Neck: Carotid bruit is not present. No thyromegaly present.  Cardiovascular: Normal rate, regular rhythm, S1 normal, S2 normal and normal heart sounds.   No extrasystoles are present.  Pulmonary/Chest: Effort normal and breath sounds normal. No respiratory distress.  Abdominal: Soft. Normal appearance and bowel sounds are normal. She exhibits no distension, no abdominal bruit, no pulsatile midline mass and no mass. There is no hepatosplenomegaly. There is no tenderness. No hernia.  Musculoskeletal: Normal range of motion. She exhibits no edema.  Lymphadenopathy:       Head (right side): No submandibular adenopathy present.       Head (left side): No submandibular adenopathy present.    She has no cervical adenopathy.    She has no axillary adenopathy.  Neurological: She is alert. She displays no tremor. No cranial nerve deficit. She exhibits normal muscle tone. Gait normal.  Reflex Scores:      Patellar reflexes are 2+ on the right side and 2+ on the left side. Skin: Skin is warm and dry. No bruising and no ecchymosis noted. No cyanosis. No pallor.  Psychiatric: Her speech is normal and behavior is normal. Thought content normal. Her mood appears not anxious. She does not exhibit a depressed mood.   Assessment/Plan:   Problem List Items Addressed This Visit      Other   Tobacco abuse    Encouraged her to quit smoking; cited leukemia nad cervica cancer and wrinkles in addition to other health problems like emphysema and heart disease and lung cancer; see AVS      Preventative health care - Primary    USPSTF grade A and B recommendations reviewed with patient; age-appropriate recommendations, preventive care, screening tests, etc discussed and encouraged; healthy living encouraged; see AVS for patient  education given to patient; she gets GYN care through Dr. Andreas Blower office; I did not check STDs today because patient says those are checked through GYN office; encoruaged her to consider HPV vaccine; flu vaccine given today; quit smoking; better diet, activity      Relevant Orders   CBC with Differential/Platelet   Lipid Panel w/o Chol/HDL Ratio   Comprehensive metabolic panel   Dyspepsia    Advised her to work on healthier diet; avoid triggers; use PPI for just two weeks      Family history of lupus erythematosus    Patient very worried about lupus; wants blood test; will check ANA today      Relevant Orders   ANA w/Reflex if Positive    Other Visit Diagnoses    Needs flu shot        flu vaccine given today    Encounter for immunization  Meds ordered this encounter  Medications  . omeprazole (PRILOSEC) 20 MG capsule    Sig: Take 1 capsule (20 mg total) by mouth daily.    Dispense:  14 capsule    Refill:  0   Orders Placed This Encounter  Procedures  . Flu Vaccine QUAD 36+ mos IM  . CBC with Differential/Platelet  . Lipid Panel w/o Chol/HDL Ratio    Order Specific Question:  Has the patient fasted?    Answer:  Yes  . Comprehensive metabolic panel    Order Specific Question:  Has the patient fasted?    Answer:  Yes  . ANA w/Reflex if Positive    Follow up plan: Return in about 1 year (around 06/01/2016) for complete physical.  An after-visit summary was printed and given to the patient at [facility].  Please see the patient instructions which may contain other information and recommendations beyond what is mentioned above in the assessment and plan.  Orders Placed This Encounter  Procedures  . Flu Vaccine QUAD 36+ mos IM  . CBC with Differential/Platelet  . Lipid Panel w/o Chol/HDL Ratio  . Comprehensive metabolic panel  . ANA w/Reflex if Positive   Meds ordered this encounter  Medications  . omeprazole (PRILOSEC) 20 MG capsule    Sig: Take 1  capsule (20 mg total) by mouth daily.    Dispense:  14 capsule    Refill:  0

## 2015-06-02 NOTE — Assessment & Plan Note (Signed)
Patient very worried about lupus; wants blood test; will check ANA today

## 2015-06-03 LAB — CBC WITH DIFFERENTIAL/PLATELET
BASOS ABS: 0 10*3/uL (ref 0.0–0.2)
Basos: 0 %
EOS (ABSOLUTE): 0.2 10*3/uL (ref 0.0–0.4)
EOS: 3 %
HEMATOCRIT: 40 % (ref 34.0–46.6)
HEMOGLOBIN: 13.8 g/dL (ref 11.1–15.9)
IMMATURE GRANS (ABS): 0 10*3/uL (ref 0.0–0.1)
Immature Granulocytes: 0 %
LYMPHS: 26 %
Lymphocytes Absolute: 1.9 10*3/uL (ref 0.7–3.1)
MCH: 29.4 pg (ref 26.6–33.0)
MCHC: 34.5 g/dL (ref 31.5–35.7)
MCV: 85 fL (ref 79–97)
MONOCYTES: 8 %
Monocytes Absolute: 0.6 10*3/uL (ref 0.1–0.9)
NEUTROS ABS: 4.6 10*3/uL (ref 1.4–7.0)
Neutrophils: 63 %
Platelets: 237 10*3/uL (ref 150–379)
RBC: 4.69 x10E6/uL (ref 3.77–5.28)
RDW: 12.1 % — ABNORMAL LOW (ref 12.3–15.4)
WBC: 7.4 10*3/uL (ref 3.4–10.8)

## 2015-06-03 LAB — COMPREHENSIVE METABOLIC PANEL
A/G RATIO: 1.8 (ref 1.1–2.5)
ALT: 32 IU/L (ref 0–32)
AST: 24 IU/L (ref 0–40)
Albumin: 4.3 g/dL (ref 3.5–5.5)
Alkaline Phosphatase: 61 IU/L (ref 39–117)
BILIRUBIN TOTAL: 0.5 mg/dL (ref 0.0–1.2)
BUN/Creatinine Ratio: 15 (ref 8–20)
BUN: 15 mg/dL (ref 6–20)
CHLORIDE: 103 mmol/L (ref 97–106)
CO2: 20 mmol/L (ref 18–29)
Calcium: 9.5 mg/dL (ref 8.7–10.2)
Creatinine, Ser: 0.97 mg/dL (ref 0.57–1.00)
GFR calc Af Amer: 98 mL/min/{1.73_m2} (ref >59)
GFR calc non Af Amer: 85 mL/min/{1.73_m2} (ref >59)
GLUCOSE: 84 mg/dL (ref 65–99)
Globulin, Total: 2.4 g/dL (ref 1.5–4.5)
POTASSIUM: 4.6 mmol/L (ref 3.5–5.2)
Sodium: 140 mmol/L (ref 136–144)
Total Protein: 6.7 g/dL (ref 6.0–8.5)

## 2015-06-03 LAB — LIPID PANEL W/O CHOL/HDL RATIO
CHOLESTEROL TOTAL: 137 mg/dL (ref 100–169)
HDL: 46 mg/dL (ref >39)
LDL CALC: 78 mg/dL (ref 0–109)
TRIGLYCERIDES: 65 mg/dL (ref 0–89)
VLDL CHOLESTEROL CAL: 13 mg/dL (ref 5–40)

## 2015-06-03 LAB — ANA W/REFLEX IF POSITIVE: ANA: NEGATIVE

## 2015-06-04 LAB — STD SCREEN (6)
HCV Ab: <0.1 s/co ratio (ref 0.0–0.9)
HIV SCREEN 4TH GENERATION: NONREACTIVE
HSV 1 GLYCOPROTEIN G AB, IGG: 45.5 {index} — AB (ref 0.00–0.90)
Hepatitis B Surface Ag: NEGATIVE
RPR: NONREACTIVE

## 2015-06-04 LAB — HCV COMMENT:

## 2015-06-04 NOTE — Progress Notes (Signed)
GYNECOLOGY PROGRESS NOTE  Subjective:    Patient ID: Dorinda HillClarissa J Alvillar, female    DOB: [birth year], 19 y.o.   MRN: [id number]  HPI  Patient is a 19 y.o. G0P0 female who initially presented for annual exam.  However, on further discussion, patient has had an annual exam with PCP earlier today (except for pelvic exam), and desires to have STI testing, discuss Gardisil injection, and receive Depo Provera injection.  Patient missed last injection (due 05/19/2015).  Had unprotected intercourse ~ 2 weeks ago.   The following portions of the patient's history were reviewed and updated as appropriate: allergies, current medications, past family history, past medical history, past social history, past surgical history and problem list.  Review of Systems A comprehensive review of systems was negative except for: Eyes: positive for visual disturbance (blurred vision...needs glasses but has not looked into it) Ears, nose, mouth, throat, and face: positive for ear pain (left x 2 weeks) Musculoskeletal: positive for back pain   Objective:   Blood pressure 120/83, pulse 98, height 5\' 2"  (1.575 m), weight 129 lb 6.4 oz (58.695 kg). LMP unknown (was receiving injections)  General appearance: alert and no distress Abdomen: soft, non-tender; bowel sounds normal; no masses,  no organomegaly Pelvic:    External female genitalia: normal, without lesions    Bladder no bladder distension noted    Urethra: normal appearing urethra with no masses, tenderness or lesions    Vulva: normal appearing vulva with no masses, tenderness or lesions    Vagina: normal appearing vagina with normal color and discharge, no lesions    Cervix: normal appearing cervix without discharge or lesions    Uterus: uterus is normal size, shape, consistency and nontender    Adnexa: normal adnexa in size, nontender and no masses    Rectal: not indicated and normal external sphincter, no lesions.  Internal exam not performed.  Extremities:  extremities normal, atraumatic, no cyanosis or edema Neurologic: Grossly normal    Labs:  Results for orders placed or performed in visit on 06/02/15  POCT urine pregnancy  Result Value Ref Range   Preg Test, Ur Negative Negative    Assessment:   Healthy female Contraception management Preventative care   Plan:   UPT negative today. LMP unknown as patient has been previously receiving Depo Provera injections.  Will administer today.  Advised on small possibility of undetected pregnancy due to recent h/o unprotected intercourse.  Counseled on safe sex practices.  STD screening performed at patient's request.  Desires to have Gardasil injection.  Discussion had regarding patient's type of insurance, would not cover, patient not willing to pay out of pocket.  [facility]    Hildred LaserAnika Keyonni Percival, MD [facility]

## 2015-06-06 LAB — GC/CHLAMYDIA PROBE AMP
Chlamydia trachomatis, NAA: POSITIVE — AB
NEISSERIA GONORRHOEAE BY PCR: NEGATIVE

## 2015-06-07 ENCOUNTER — Encounter: Payer: Self-pay | Admitting: Family Medicine

## 2015-06-12 ENCOUNTER — Telehealth: Payer: Self-pay

## 2015-06-12 DIAGNOSIS — A749 Chlamydial infection, unspecified: Secondary | ICD-10-CM

## 2015-06-12 MED ORDER — AZITHROMYCIN 500 MG PO TABS: ORAL_TABLET | ORAL | Status: DC

## 2015-06-12 NOTE — Telephone Encounter (Signed)
-----   Message from Hildred LaserAnika Cherry, MD sent at 06/08/2015 10:11 AM EST ----- Please inform patient that Chlamydia test is positive.  Recommend treatment with Azithromycin 1 gram (PO x 1 dose), partner testing/treatment, and avoid intercourse until 1 week after both partners treated.

## 2015-06-12 NOTE — Telephone Encounter (Signed)
Pt informed of + Chlamydia testing and the need for treatment. RX sent in. Pt advised to have partner treated and the need to refrain from sex.

## 2015-06-13 ENCOUNTER — Encounter: Payer: Self-pay | Admitting: Emergency Medicine

## 2015-06-13 ENCOUNTER — Emergency Department
Admission: EM | Admit: 2015-06-13 | Discharge: 2015-06-13 | Disposition: A | Discharge status: Home / Self Care | Payer: Medicaid Other | Attending: Emergency Medicine | Admitting: Emergency Medicine

## 2015-06-13 DIAGNOSIS — R109 Unspecified abdominal pain: Secondary | ICD-10-CM | POA: Insufficient documentation

## 2015-06-13 DIAGNOSIS — Z792 Long term (current) use of antibiotics: Secondary | ICD-10-CM | POA: Insufficient documentation

## 2015-06-13 DIAGNOSIS — Z79899 Other long term (current) drug therapy: Secondary | ICD-10-CM | POA: Insufficient documentation

## 2015-06-13 DIAGNOSIS — Z3202 Encounter for pregnancy test, result negative: Secondary | ICD-10-CM | POA: Insufficient documentation

## 2015-06-13 DIAGNOSIS — Z720 Tobacco use: Secondary | ICD-10-CM | POA: Insufficient documentation

## 2015-06-13 LAB — POCT PREGNANCY, URINE: Preg Test, Ur: NEGATIVE

## 2015-06-13 LAB — URINALYSIS COMPLETE WITH MICROSCOPIC (ARMC ONLY)
BILIRUBIN URINE: NEGATIVE
Bacteria, UA: NONE SEEN
Glucose, UA: NEGATIVE mg/dL
Hgb urine dipstick: NEGATIVE
KETONES UR: NEGATIVE mg/dL
NITRITE: NEGATIVE
PH: 6 (ref 5.0–8.0)
Protein, ur: NEGATIVE mg/dL
SPECIFIC GRAVITY, URINE: 1.019 (ref 1.005–1.030)

## 2015-06-13 MED ORDER — DICYCLOMINE HCL 10 MG PO CAPS: 10.0000 mg | ORAL_CAPSULE | Freq: Four times a day (QID) | ORAL | Status: DC

## 2015-06-13 NOTE — Discharge Instructions (Signed)
Bentyl as needed for abdominal cramping            this is from the medication that he took and should subside without any difficulty.

## 2015-06-13 NOTE — ED Notes (Signed)
Pregnancy test complete - negative and documented

## 2015-06-13 NOTE — ED Notes (Addendum)
Took 1 gram zithro with food and after 45 mins started with abd cramps. Pain is mid epigastric cramping

## 2015-06-13 NOTE — ED Provider Notes (Signed)
[facility] Emergency Department Provider Note  ____________________________________________  Time seen: Approximately 2:15 PM  I have reviewed the triage vital signs and the nursing notes.   HISTORY  Chief Complaint Abdominal Pain  HPI Tamara Bishop is a 19 y.o. female is here with complaint of abdominal cramps after taking 1 g of Zithromax today. Patient states she was treated for chlamydia and was told to take all tablets at one time adding up to 1 g. She states that she has had abdominal cramping since taking the medication. She is not taking any over-the-counter medication for this. She denies any worsening of her vaginal symptoms. There is been no nausea vomiting and she was able to retain the medication.She rates her pain as a 2 out of 10.   Past Medical History  Diagnosis Date  . Anxiety   . Depression   . Cigarette smoker   . Adjustment disorder with anxiety   . Allergic rhinitis   . Insomnia   . Tobacco abuse     Patient Active Problem List   Diagnosis Date Noted  . Preventative health care 06/02/2015  . Dyspepsia 06/02/2015  . Family history of lupus erythematosus 06/02/2015  . Adjustment disorder with anxiety   . Allergic rhinitis   . Insomnia   . Tobacco abuse   . Anxiety and depression 02/16/2015    Past Surgical History  Procedure Laterality Date  . Wisdom tooth extraction      Current Outpatient Rx  Name  Route  Sig  Dispense  Refill  . azithromycin (ZITHROMAX) 500 MG tablet      Take 2 tablets by mouth as a one time dose   2 tablet   0   . dicyclomine (BENTYL) 10 MG capsule   Oral   Take 1 capsule (10 mg total) by mouth 4 (four) times daily.   10 capsule   0   . medroxyPROGESTERone (DEPO-PROVERA) 150 MG/ML injection   Intramuscular   Inject 150 mg into the muscle every 3 (three) months.         Marland Kitchen. omeprazole (PRILOSEC) 20 MG capsule   Oral   Take 1 capsule (20 mg total) by mouth daily.   14 capsule   0     Allergies Review of patient's allergies indicates no known allergies.  Family History  Problem Relation Age of Onset  . Lupus Mother   . Diabetes Father   . Breast cancer Maternal Grandmother   . Cancer Maternal Grandmother      breast  . Diabetes Paternal Grandmother   . Diabetes Paternal Grandfather   . Lupus Sister   . Heart disease Maternal Grandfather     Social History Social History  Substance Use Topics  . Smoking status: Current Every Day Smoker -- 0.50 packs/day for 1 years    Types: Cigarettes  . Smokeless tobacco: Never Used  . Alcohol Use: No    Review of Systems Constitutional: No fever/chills ENT: No sore throat. Cardiovascular: Denies chest pain. Respiratory: Denies shortness of breath. Gastrointestinal: Positive abdominal pain.  No nausea, no vomiting.  No diarrhea.   Genitourinary: Negative for dysuria. Musculoskeletal: Negative for back pain. Skin: Negative for rash. Neurological: Negative for headaches  10-point ROS otherwise negative.  ____________________________________________   PHYSICAL EXAM:  VITAL SIGNS: ED Triage Vitals  Enc Vitals Group     BP 06/13/15 1318 146/92 mmHg     Pulse Rate 06/13/15 1318 118     Resp 06/13/15 1318 16  Temp 06/13/15 1318 99 F (37.2 C)     Temp Source 06/13/15 1318 Oral     SpO2 06/13/15 1318 100 %     Weight 06/13/15 1318 136 lb (61.689 kg)     Height 06/13/15 1318  (1.549 m)     Head Cir --      Peak Flow --      Pain Score 06/13/15 1320 2     Pain Loc --      Pain Edu? --      Excl. in GC? --     Constitutional: Alert and oriented. Well appearing and in no acute distress. Eyes: Conjunctivae are normal. PERRL. EOMI. Head: Atraumatic. Nose: No congestion/rhinnorhea. Neck: No stridor.   Cardiovascular: Normal rate, regular rhythm. Grossly normal heart sounds.  Good peripheral circulation. Respiratory: Normal respiratory effort.  No retractions. Lungs CTAB. Gastrointestinal: Soft  and nontender. No distention. Bowel sounds 4 quadrants within normal limits. Musculoskeletal: No lower extremity tenderness nor edema.  No joint effusions. Neurologic:  Normal speech and language. No gross focal neurologic deficits are appreciated. No gait instability. Skin:  Skin is warm, dry and intact. No rash noted. Psychiatric: Mood and affect are normal. Speech and behavior are normal.  ____________________________________________   LABS (all labs ordered are listed, but only abnormal results are displayed)  Labs Reviewed  URINALYSIS COMPLETEWITH MICROSCOPIC ([facility] ONLY) - Abnormal; Notable for the following:    Color, Urine YELLOW (*)    APPearance CLOUDY (*)    Leukocytes, UA 3+ (*)    Squamous Epithelial / LPF TOO NUMEROUS TO COUNT (*)    All other components within normal limits  POC URINE PREG, ED  POCT PREGNANCY, URINE    PROCEDURES  Procedure(s) performed: None  Critical Care performed: No  ____________________________________________   INITIAL IMPRESSION / ASSESSMENT AND PLAN / ED COURSE  Pertinent labs & imaging results that were available during my care of the patient were reviewed by me and considered in my medical decision making (see chart for details).  Patient was reassured that most likely is the Zithromax is causing her abdominal cramping. She is given a prescription for Bentyl 10 mg one every 6 hours as needed for cramping #10 no refill. Patient is to return to the emergency room if any severe worsening of her symptoms. ____________________________________________   FINAL CLINICAL IMPRESSION(S) / ED DIAGNOSES  Final diagnoses:  Abdominal cramps  due to medication    Tommi Rumps, PA-C 06/13/15 1536  Darien Ramus, MD 06/14/15 [phone]

## 2015-07-13 ENCOUNTER — Ambulatory Visit (INDEPENDENT_AMBULATORY_CARE_PROVIDER_SITE_OTHER): Payer: Self-pay | Admitting: Family Medicine

## 2015-07-13 ENCOUNTER — Encounter: Payer: Self-pay | Admitting: Family Medicine

## 2015-07-13 VITALS — BP 129/89 | HR 90 | Temp 98.6°F

## 2015-07-13 DIAGNOSIS — Z3042 Encounter for surveillance of injectable contraceptive: Secondary | ICD-10-CM

## 2015-07-13 DIAGNOSIS — J029 Acute pharyngitis, unspecified: Secondary | ICD-10-CM | POA: Insufficient documentation

## 2015-07-13 DIAGNOSIS — R52 Pain, unspecified: Secondary | ICD-10-CM

## 2015-07-13 DIAGNOSIS — B349 Viral infection, unspecified: Secondary | ICD-10-CM

## 2015-07-13 LAB — PLEASE NOTE:

## 2015-07-13 LAB — INFLUENZA A AND B
INFLUENZA A AG, EIA: NEGATIVE
Influenza B Ag, EIA: NEGATIVE

## 2015-07-13 NOTE — Progress Notes (Signed)
BP 129/89 mmHg  Pulse 90  Temp(Src) 98.6 F (37 C)  SpO2 100%   Subjective:    Patient ID: Tamara Bishop, female    DOB: 11/06/1995, 19 y.o.   MRN: [id number]  HPI: Tamara HillClarissa J Mayse is a 19 y.o. female  Chief Complaint  Patient presents with  . URI    Sore throat, face feels swollen, headache, ear ache, runny nose, chills, body aches since Friday. She also needs a work note for yesterday and today.   She got sick on Friday; runny nose, headache, body aches; chills; has not checked her temperature; broke a sweat in her sleep, just drenched She has been taking dayquil and nyquil, drinking theraflu; they nyquil did not make her tired; took acetaminophen pm several hours later and it still didn't help her sleep No travel Somebody at work called out Saturday morning with similar symptoms  She gets her Depo shots from Dr. Valentino Saxonherry at [facility]  Relevant past medical, surgical, family and social history reviewed and updated as indicated. Interim medical history since our last visit reviewed. Allergies and medications reviewed and updated.  Review of Systems  Per HPI unless specifically indicated above     Objective:    BP 129/89 mmHg  Pulse 90  Temp(Src) 98.6 F (37 C)  SpO2 100%  Wt Readings from Last 3 Encounters:  06/13/15 136 lb (61.689 kg) (65 %*, Z = 0.39)  06/02/15 129 lb 6.4 oz (58.695 kg) (55 %*, Z = 0.11)  06/02/15 126 lb (57.153 kg) (48 %*, Z = -0.05)   * Growth percentiles are based on CDC 2-20 Years data.    Physical Exam  Constitutional:  Non-toxic appearance. She appears ill. No distress.  HENT:  Head: Normocephalic and atraumatic.  Right Ear: Hearing, tympanic membrane, external ear and ear canal normal. Tympanic membrane is not erythematous.  Left Ear: Hearing, tympanic membrane, external ear and ear canal normal. Tympanic membrane is not erythematous.  Nose: Rhinorrhea present.  Mouth/Throat: Mucous membranes are normal. Mucous membranes are not  dry. Posterior oropharyngeal erythema present. No oropharyngeal exudate or posterior oropharyngeal edema.  Eyes: Right eye exhibits no discharge. Left eye exhibits no discharge. Right conjunctiva is injected. Left conjunctiva is injected.  Neck: Neck supple.  Cardiovascular: Normal rate and regular rhythm.   Pulmonary/Chest: Effort normal and breath sounds normal. She has no decreased breath sounds. She has no wheezes. She has no rhonchi. She has no rales.  Neurological: She is alert. She displays no tremor.  Skin: Skin is warm and dry. No rash noted. She is not diaphoretic. No pallor.  Psychiatric:  Cooperative, though not feeling well    Results for orders placed or performed in visit on 07/13/15  Rapid strep screen (not at [facility])  Result Value Ref Range   Strep A Culture Comment (A)   Influenza a and b  Result Value Ref Range   Influenza A Ag, EIA Negative Negative   Influenza B Ag, EIA Negative Negative   Influenza Comment See note   Please note:  Result Value Ref Range   Please note: Comment       Assessment & Plan:   Problem List Items Addressed This Visit      Respiratory   Acute pharyngitis    Throat culture pending, rapid strep negative; supportive, symptomatic care      Relevant Orders   Rapid strep screen (not at [facility]) (Completed)    Other Visit Diagnoses    Acute viral syndrome    -  Primary    flu-like illness; influenza A and B negative; more than 48 hours out since onset of symptoms; no s/s of pneumonia; reasons to call reviewed; see AVS    Body aches        likely viral syndrome; flu tests negative; hydration, rest, supportive care    Relevant Orders    Influenza A+B Ag, EIA       Follow up plan: No Follow-up on file.  An after-visit summary was printed and given to the patient at check-out.  Please see the patient instructions which may contain other information and recommendations beyond what is mentioned above in the assessment and plan.  Orders  Placed This Encounter  Procedures  . Rapid strep screen (not at [facility])  . Influenza a and b  . Influenza A+B Ag, EIA  . Please note:

## 2015-07-13 NOTE — Patient Instructions (Addendum)
Be careful to not mix over-the-counter medicines Try vitamin C (orange juice if not diabetic or vitamin C tablets) and drink green tea to help your immune system during your illness Get plenty of rest and hydration GINA--> please write patient TWO work notes, out of work yesterday, today, and Doctor, hospitaltomorrow Watch for signs/symptoms of secondary bacterial infection and call or go to urgent care if getting worse You are contagious so stay away from others until symptoms have resolved  Upper Respiratory Infection, Adult Most upper respiratory infections (URIs) are a viral infection of the air passages leading to the lungs. A URI affects the nose, throat, and upper air passages. The most common type of URI is nasopharyngitis and is typically referred to as "the common cold." URIs run their course and usually go away on their own. Most of the time, a URI does not require medical attention, but sometimes a bacterial infection in the upper airways can follow a viral infection. This is called a secondary infection. Sinus and middle ear infections are common types of secondary upper respiratory infections. Bacterial pneumonia can also complicate a URI. A URI can worsen asthma and chronic obstructive pulmonary disease (COPD). Sometimes, these complications can require emergency medical care and may be life threatening.  CAUSES Almost all URIs are caused by viruses. A virus is a type of germ and can spread from one person to another.  RISKS FACTORS You may be at risk for a URI if:   You smoke.   You have chronic heart or lung disease.  You have a weakened defense (immune) system.   You are very young or very old.   You have nasal allergies or asthma.  You work in crowded or poorly ventilated areas.  You work in health care facilities or schools. SIGNS AND SYMPTOMS  Symptoms typically develop 2-3 days after you come in contact with a cold virus. Most viral URIs last 7-10 days. However, viral URIs from  the influenza virus (flu virus) can last 14-18 days and are typically more severe. Symptoms may include:   Runny or stuffy (congested) nose.   Sneezing.   Cough.   Sore throat.   Headache.   Fatigue.   Fever.   Loss of appetite.   Pain in your forehead, behind your eyes, and over your cheekbones (sinus pain).  Muscle aches.  DIAGNOSIS  Your health care provider may diagnose a URI by:  Physical exam.  Tests to check that your symptoms are not due to another condition such as:  Strep throat.  Sinusitis.  Pneumonia.  Asthma. TREATMENT  A URI goes away on its own with time. It cannot be cured with medicines, but medicines may be prescribed or recommended to relieve symptoms. Medicines may help:  Reduce your fever.  Reduce your cough.  Relieve nasal congestion. HOME CARE INSTRUCTIONS   Take medicines only as directed by your health care provider.   Gargle warm saltwater or take cough drops to comfort your throat as directed by your health care provider.  Use a warm mist humidifier or inhale steam from a shower to increase air moisture. This may make it easier to breathe.  Drink enough fluid to keep your urine clear or pale yellow.   Eat soups and other clear broths and maintain good nutrition.   Rest as needed.   Return to work when your temperature has returned to normal or as your health care provider advises. You may need to stay home longer to avoid infecting others.  You can also use a face mask and careful hand washing to prevent spread of the virus.  Increase the usage of your inhaler if you have asthma.   Do not use any tobacco products, including cigarettes, chewing tobacco, or electronic cigarettes. If you need help quitting, ask your health care provider. PREVENTION  The best way to protect yourself from getting a cold is to practice good hygiene.   Avoid oral or hand contact with people with cold symptoms.   Wash your hands often  if contact occurs.  There is no clear evidence that vitamin C, vitamin E, echinacea, or exercise reduces the chance of developing a cold. However, it is always recommended to get plenty of rest, exercise, and practice good nutrition.  SEEK MEDICAL CARE IF:   You are getting worse rather than better.   Your symptoms are not controlled by medicine.   You have chills.  You have worsening shortness of breath.  You have brown or red mucus.  You have yellow or brown nasal discharge.  You have pain in your face, especially when you bend forward.  You have a fever.  You have swollen neck glands.  You have pain while swallowing.  You have white areas in the back of your throat. SEEK IMMEDIATE MEDICAL CARE IF:   You have severe or persistent:  Headache.  Ear pain.  Sinus pain.  Chest pain.  You have chronic lung disease and any of the following:  Wheezing.  Prolonged cough.  Coughing up blood.  A change in your usual mucus.  You have a stiff neck.  You have changes in your:  Vision.  Hearing.  Thinking.  Mood. MAKE SURE YOU:   Understand these instructions.  Will watch your condition.  Will get help right away if you are not doing well or get worse.   This information is not intended to replace advice given to you by your health care provider. Make sure you discuss any questions you have with your health care provider.   Document Released: 01/11/2001 Document Revised: 12/02/2014 Document Reviewed: 10/23/2013 Elsevier Interactive Patient Education [employer]

## 2015-07-15 ENCOUNTER — Ambulatory Visit: Payer: Medicaid Other | Admitting: Family Medicine

## 2015-07-15 LAB — RAPID STREP SCREEN (MED CTR MEBANE ONLY)

## 2015-07-18 DIAGNOSIS — Z3042 Encounter for surveillance of injectable contraceptive: Secondary | ICD-10-CM | POA: Insufficient documentation

## 2015-07-18 NOTE — Assessment & Plan Note (Signed)
Throat culture pending, rapid strep negative; supportive, symptomatic care

## 2015-08-18 ENCOUNTER — Ambulatory Visit: Payer: Medicaid Other

## 2015-08-18 ENCOUNTER — Telehealth: Payer: Self-pay | Admitting: Obstetrics and Gynecology

## 2015-08-18 DIAGNOSIS — Z3041 Encounter for surveillance of contraceptive pills: Secondary | ICD-10-CM

## 2015-08-18 NOTE — Telephone Encounter (Signed)
Ok.  She can begin something like Junel .  Please inform patient that she may notice an irregularity (delayed menses, irregular bleeding) in  her next cycle as she is changing to a different hormonal method.

## 2015-08-18 NOTE — Telephone Encounter (Signed)
Pt calls and states that she no longer wants to do Depo-Provera for contraception. Pt states she is interested in OCPs, pt just recently had annual exam. Pt is a smoker, please advise on what if any OCP I can send in for pt.

## 2015-08-18 NOTE — Telephone Encounter (Signed)
Pt is currently on depo inj for [facility]. She is due to have an in j this Thursday but wants to see if she can change to [facility] pilss. She asked for you to call her.

## 2015-08-19 MED ORDER — NORETHIN ACE-ETH ESTRAD-FE 1-20 MG-MCG PO TABS: 1.0000 | ORAL_TABLET | Freq: Every day | ORAL | Status: DC

## 2015-08-19 NOTE — Telephone Encounter (Signed)
RX for [employer] [facility] sent in to pt's pharmacy.

## 2015-08-20 ENCOUNTER — Ambulatory Visit: Payer: Medicaid Other

## 2015-10-14 ENCOUNTER — Emergency Department
Admission: EM | Admit: 2015-10-14 | Discharge: 2015-10-14 | Disposition: A | Discharge status: Home / Self Care | Payer: Medicaid Other | Attending: Emergency Medicine | Admitting: Emergency Medicine

## 2015-10-14 ENCOUNTER — Other Ambulatory Visit: Payer: Self-pay | Admitting: Family Medicine

## 2015-10-14 ENCOUNTER — Encounter: Payer: Self-pay | Admitting: *Deleted

## 2015-10-14 ENCOUNTER — Ambulatory Visit: Payer: Self-pay | Admitting: Family Medicine

## 2015-10-14 DIAGNOSIS — H6692 Otitis media, unspecified, left ear: Secondary | ICD-10-CM | POA: Insufficient documentation

## 2015-10-14 DIAGNOSIS — Z881 Allergy status to other antibiotic agents status: Secondary | ICD-10-CM | POA: Insufficient documentation

## 2015-10-14 DIAGNOSIS — R52 Pain, unspecified: Secondary | ICD-10-CM

## 2015-10-14 DIAGNOSIS — J019 Acute sinusitis, unspecified: Secondary | ICD-10-CM | POA: Insufficient documentation

## 2015-10-14 DIAGNOSIS — Z79899 Other long term (current) drug therapy: Secondary | ICD-10-CM | POA: Insufficient documentation

## 2015-10-14 DIAGNOSIS — F17210 Nicotine dependence, cigarettes, uncomplicated: Secondary | ICD-10-CM | POA: Insufficient documentation

## 2015-10-14 DIAGNOSIS — F329 Major depressive disorder, single episode, unspecified: Secondary | ICD-10-CM | POA: Insufficient documentation

## 2015-10-14 LAB — POCT RAPID STREP A: Streptococcus, Group A Screen (Direct): NEGATIVE

## 2015-10-14 MED ORDER — PSEUDOEPH-BROMPHEN-DM 30-2-10 MG/5ML PO SYRP: 10.0000 mL | ORAL_SOLUTION | Freq: Four times a day (QID) | ORAL | Status: DC | PRN

## 2015-10-14 MED ORDER — FLUTICASONE PROPIONATE 50 MCG/ACT NA SUSP: 2.0000 | Freq: Every day | NASAL | Status: DC

## 2015-10-14 MED ORDER — AMOXICILLIN 875 MG PO TABS: 875.0000 mg | ORAL_TABLET | Freq: Two times a day (BID) | ORAL | Status: DC

## 2015-10-14 NOTE — ED Notes (Signed)
Throat red with white patches, sore. sinus congestion. Lung sounds clear.

## 2015-10-14 NOTE — Discharge Instructions (Signed)
Otitis Media, Adult Otitis media is redness, soreness, and puffiness (swelling) in the space just behind your eardrum (middle ear). It may be caused by allergies or infection. It often happens along with a cold. HOME CARE  Take your medicine as told. Finish it even if you start to feel better.  Only take over-the-counter or prescription medicines for pain, discomfort, or fever as told by your doctor.  Follow up with your doctor as told. GET HELP IF:  You have otitis media only in one ear, or bleeding from your nose, or both.  You notice a lump on your neck.  You are not getting better in 3-5 days.  You feel worse instead of better. GET HELP RIGHT AWAY IF:   You have pain that is not helped with medicine.  You have puffiness, redness, or pain around your ear.  You get a stiff neck.  You cannot move part of your face (paralysis).  You notice that the bone behind your ear hurts when you touch it. MAKE SURE YOU:   Understand these instructions.  Will watch your condition.  Will get help right away if you are not doing well or get worse.   This information is not intended to replace advice given to you by your health care provider. Make sure you discuss any questions you have with your health care provider.   Document Released: 01/04/2008 Document Revised: 08/08/2014 Document Reviewed: 02/12/2013 Elsevier Interactive Patient Education 2016 Else[employer] Inc[employer]  Sinusitis, Adult Sinusitis is redness, soreness, and inflammation of the paranasal sinuses. Paranasal sinuses are air pockets within the bones of your face. They are located beneath your eyes, in the middle of your forehead, and above your eyes. In healthy paranasal sinuses, mucus is able to drain out, and air is able to circulate through them by way of your nose. However, when your paranasal sinuses are inflamed, mucus and air can become trapped. This can allow bacteria and other germs to grow and cause infection. Sinusitis  can develop quickly and last only a short time (acute) or continue over a long period (chronic). Sinusitis that lasts for more than 12 weeks is considered chronic. CAUSES Causes of sinusitis include:  Allergies.  Structural abnormalities, such as displacement of the cartilage that separates your nostrils (deviated septum), which can decrease the air flow through your nose and sinuses and affect sinus drainage.  Functional abnormalities, such as when the small hairs (cilia) that line your sinuses and help remove mucus do not work properly or are not present. SIGNS AND SYMPTOMS Symptoms of acute and chronic sinusitis are the same. The primary symptoms are pain and pressure around the affected sinuses. Other symptoms include:  Upper toothache.  Earache.  Headache.  Bad breath.  Decreased sense of smell and taste.  A cough, which worsens when you are lying flat.  Fatigue.  Fever.  Thick drainage from your nose, which often is green and may contain pus (purulent).  Swelling and warmth over the affected sinuses. DIAGNOSIS Your health care provider will perform a physical exam. During your exam, your health care provider may perform any of the following to help determine if you have acute sinusitis or chronic sinusitis:  Look in your nose for signs of abnormal growths in your nostrils (nasal polyps).  Tap over the affected sinus to check for signs of infection.  View the inside of your sinuses using an imaging device that has a light attached (endoscope). If your health care provider suspects that you have  chronic sinusitis, one or more of the following tests may be recommended:  Allergy tests.  Nasal culture. A sample of mucus is taken from your nose, sent to a lab, and screened for bacteria.  Nasal cytology. A sample of mucus is taken from your nose and examined by your health care provider to determine if your sinusitis is related to an allergy. TREATMENT Most cases of  acute sinusitis are related to a viral infection and will resolve on their own within 10 days. Sometimes, medicines are prescribed to help relieve symptoms of both acute and chronic sinusitis. These may include pain medicines, decongestants, nasal steroid sprays, or saline sprays. However, for sinusitis related to a bacterial infection, your health care provider will prescribe antibiotic medicines. These are medicines that will help kill the bacteria causing the infection. Rarely, sinusitis is caused by a fungal infection. In these cases, your health care provider will prescribe antifungal medicine. For some cases of chronic sinusitis, surgery is needed. Generally, these are cases in which sinusitis recurs more than 3 times per year, despite other treatments. HOME CARE INSTRUCTIONS  Drink plenty of water. Water helps thin the mucus so your sinuses can drain more easily.  Use a humidifier.  Inhale steam 3-4 times a day (for example, sit in the bathroom with the shower running).  Apply a warm, moist washcloth to your face 3-4 times a day, or as directed by your health care provider.  Use saline nasal sprays to help moisten and clean your sinuses.  Take medicines only as directed by your health care provider.  If you were prescribed either an antibiotic or antifungal medicine, finish it all even if you start to feel better. SEEK IMMEDIATE MEDICAL CARE IF:  You have increasing pain or severe headaches.  You have nausea, vomiting, or drowsiness.  You have swelling around your face.  You have vision problems.  You have a stiff neck.  You have difficulty breathing.   This information is not intended to replace advice given to you by your health care provider. Make sure you discuss any questions you have with your health care provider.   Document Released: 07/18/2005 Document Revised: 08/08/2014 Document Reviewed: 08/02/2011 Elsevier Interactive Patient Education 2016 Elsevier  Inc[employer]  Sinus Rinse WHAT IS A SINUS RINSE? A sinus rinse is a home treatment. It rinses your sinuses with a mixture of salt and water (saline solution). Sinuses are air-filled spaces in your skull behind the bones of your face and forehead. They open into your nasal cavity. To do a sinus rinse, you will need:  Saline solution.  Neti pot or spray bottle. This releases the saline solution into your nose and through your sinuses. You can buy neti pots and spray bottles at:  Your local pharmacy.  A health food store.  Online. WHEN WOULD I DO A SINUS RINSE?  A sinus rinse can help to clear your nasal cavity. It can clear:   Mucus.  Dirt.  Dust.  Pollen. You may do a sinus rinse when you have:  A cold.  A virus.  Allergies.  A sinus infection.  A stuffy nose. If you are considering a sinus rinse:  Ask your child's doctor before doing a sinus rinse on your child.  Do not do a sinus rinse if you have had:  Ear or nasal surgery.  An ear infection.  Blocked ears. HOW DO I DO A SINUS RINSE?   Wash your hands.  Disinfect your device using the directions that  came with the device.  Dry your device.  Use the solution that comes with your device or one that is sold separately in stores. Follow the mixing directions on the package.  Fill your device with the amount of saline solution as stated in the device instructions.  Stand over a sink and tilt your head sideways over the sink.  Place the spout of the device in your upper nostril (the one closer to the ceiling).  Gently pour or squeeze the saline solution into the nasal cavity. The liquid should drain to the lower nostril if you are not too congested.  Gently blow your nose. Blowing too hard may cause ear pain.  Repeat in the other nostril.  Clean and rinse your device with clean water.  Air-dry your device. ARE THERE RISKS OF A SINUS RINSE?  Sinus rinse is normally very safe and helpful. However, there are  a few risks, which include:   A burning feeling in the sinuses. This may happen if you do not make the saline solution as instructed. Make sure to follow all directions when making the saline solution.  Infection from unclean water. This is rare, but possible.  Nasal irritation.   This information is not intended to replace advice given to you by your health care provider. Make sure you discuss any questions you have with your health care provider.   Document Released: 02/12/2014 Document Reviewed: 02/12/2014 Elsevier Interactive Patient Education [employer]

## 2015-10-14 NOTE — ED Notes (Signed)
Pt complains of fever, chills and congestion

## 2015-10-14 NOTE — ED Notes (Signed)
Unable to obtain e-signature.  Topaz not working.  Paperwork given to patient.

## 2015-10-14 NOTE — ED Provider Notes (Signed)
[facility] Emergency Department Provider Note  ____________________________________________  Time seen: Approximately 1:55 PM  I have reviewed the triage vital signs and the nursing notes.   HISTORY  Chief Complaint Fever and Chills    HPI Tamara Bishop is a 20 y.o. female , NAD, presents to the emergency department with 2 day history of sore throat, sinus congestion, left ear pain, cough and chest congestion. Has had fever, chills off and on over the last 2 days. Has a history of strep pharyngitis but has not been sick with such in the last 6 months. Has taken TheraFlu over-the-counter with no significant relief in her symptoms. Denies abdominal pain, nausea, vomiting, diarrhea. Has had no headaches, chest pain, back pain.   Past Medical History  Diagnosis Date  . Anxiety   . Depression   . Cigarette smoker   . Adjustment disorder with anxiety   . Allergic rhinitis   . Insomnia   . Tobacco abuse     Patient Active Problem List   Diagnosis Date Noted  . Depo-Provera contraceptive status 07/18/2015  . Acute pharyngitis 07/13/2015  . Preventative health care 06/02/2015  . Dyspepsia 06/02/2015  . Family history of lupus erythematosus 06/02/2015  . Adjustment disorder with anxiety   . Allergic rhinitis   . Insomnia   . Tobacco abuse   . Anxiety and depression 02/16/2015    Past Surgical History  Procedure Laterality Date  . Wisdom tooth extraction      Current Outpatient Rx  Name  Route  Sig  Dispense  Refill  . amoxicillin (AMOXIL) 875 MG tablet   Oral   Take 1 tablet (875 mg total) by mouth 2 (two) times daily.   20 tablet   0   . brompheniramine-pseudoephedrine-DM 30-2-10 MG/5ML syrup   Oral   Take 10 mLs by mouth 4 (four) times daily as needed.   200 mL   0   . fluticasone (FLONASE) 50 MCG/ACT nasal spray   Each Nare   Place 2 sprays into both nostrils daily.   16 g   0   . medroxyPROGESTERone (DEPO-PROVERA) 150 MG/ML  injection   Intramuscular   Inject 150 mg into the muscle every 3 (three) months.         . norethindrone-ethinyl estradiol (JUNEL FE 1/20) 1-20 MG-MCG tablet   Oral   Take 1 tablet by mouth daily.   1 Package   11     Allergies Azithromycin  Family History  Problem Relation Age of Onset  . Lupus Mother   . Diabetes Father   . Breast cancer Maternal Grandmother   . Cancer Maternal Grandmother      breast  . Diabetes Paternal Grandmother   . Diabetes Paternal Grandfather   . Lupus Sister   . Heart disease Maternal Grandfather     Social History Social History  Substance Use Topics  . Smoking status: Current Every Day Smoker -- 0.50 packs/day for 1 years    Types: Cigarettes  . Smokeless tobacco: Never Used  . Alcohol Use: No     Review of Systems  Constitutional: Positive fever/chills Eyes: No visual changes. No discharge ENT: Positive nasal congestion, runny nose, left ear pain, sore throat. Cardiovascular: No chest pain. Respiratory: Positive chest congestion, productive cough. No shortness of breath. No wheezing.  Gastrointestinal: No abdominal pain.  No nausea, vomiting.  No diarrhea.   Musculoskeletal: Positive for general myalgias.  Skin: Negative for rash. Neurological: Negative for headaches, focal  weakness or numbness. 10-point ROS otherwise negative.  ____________________________________________   PHYSICAL EXAM:  VITAL SIGNS: ED Triage Vitals  Enc Vitals Group     BP 10/14/15 1305 127/89 mmHg     Pulse Rate 10/14/15 1305 113     Resp 10/14/15 1305 20     Temp 10/14/15 1305 99.4 F (37.4 C)     Temp Source 10/14/15 1305 Oral     SpO2 10/14/15 1305 100 %     Weight 10/14/15 1305 133 lb (60.328 kg)     Height 10/14/15 1305  (1.549 m)     Head Cir --      Peak Flow --      Pain Score --      Pain Loc --      Pain Edu? --      Excl. in GC? --     Constitutional: Alert and oriented. Well appearing and in no acute distress. Eyes:  Conjunctivae are normal. PERRL. EOMI without pain.  Head: Atraumatic. ENT:      Ears: Right TM visualized with mild serous effusion but no erythema, bulging, perforation. Left TM visualized with mild erythema, moderate serous effusion and mild bulging but no perforation.      Nose: Moderate congestion with profuse clear rhinnorhea.      Mouth/Throat: Mucous membranes are moist. Bilateral tonsils with mild swelling and injection but no overt erythema. Some white exudate noted. Neck: Supple with full range of motion. Hematological/Lymphatic/Immunilogical: Positive bilateral anterior shotty cervical lymphadenopathy without tenderness to palpation and all are mobile. Cardiovascular: Normal rate, regular rhythm. Normal S1 and S2.  Good peripheral circulation. Respiratory: Normal respiratory effort without tachypnea or retractions. Lungs CTAB. Neurologic:  Normal speech and language. No gross focal neurologic deficits are appreciated.  Skin:  Skin is warm, dry and intact. No rash noted. Psychiatric: Mood and affect are normal. Speech and behavior are normal. Patient exhibits appropriate insight and judgement.   ____________________________________________   LABS (all labs ordered are listed, but only abnormal results are displayed)  Labs Reviewed  POCT RAPID STREP A   ____________________________________________  EKG  None ____________________________________________  RADIOLOGY  None ____________________________________________    PROCEDURES  Procedure(s) performed: None    Medications - No data to display   ____________________________________________   INITIAL IMPRESSION / ASSESSMENT AND PLAN / ED COURSE  Pertinent lab results that were available during my care of the patient were reviewed by me and considered in my medical decision making (see chart for details).  Patient's diagnosis is consistent with left otitis media and acute sinusitis. Patient will be  discharged home with prescriptions for amoxicillin, Flonase, Bromfed-DM syrup to take as directed. May continue Tylenol or ibuprofen as needed for fever or aches. Patient given a work note to excuse today and tomorrow. Patient is to follow up with primary care provider or [facility] if symptoms persist past this treatment course. Patient is given ED precautions to return to the ED for any worsening or new symptoms.    ____________________________________________  FINAL CLINICAL IMPRESSION(S) / ED DIAGNOSES  Final diagnoses:  Acute left otitis media, recurrence not specified, unspecified otitis media type  Acute sinusitis, recurrence not specified, unspecified location      NEW MEDICATIONS STARTED DURING THIS VISIT:  New Prescriptions   AMOXICILLIN (AMOXIL) 875 MG TABLET    Take 1 tablet (875 mg total) by mouth 2 (two) times daily.   BROMPHENIRAMINE-PSEUDOEPHEDRINE-DM 30-2-10 MG/5ML SYRUP    Take 10 mLs by mouth  4 (four) times daily as needed.   FLUTICASONE (FLONASE) 50 MCG/ACT NASAL SPRAY    Place 2 sprays into both nostrils daily.         Hope Pigeon, PA-C 10/14/15 1400  Jennye Moccasin, MD 10/14/15 [phone]

## 2016-01-03 IMAGING — CT CT ABD-PELV W/ CM
2 of 4 series · 16 of 46 positions shown, 18 images · IV contrast (isovue)
Comparison: None.

CLINICAL DATA: Acute onset vomiting 1 hour ago, possible fever,
possibly pre syncopal. Recent headache.

EXAM:
CT ABDOMEN AND PELVIS WITH CONTRAST
TECHNIQUE: Multidetector CT imaging of the abdomen and pelvis was performed
using the standard protocol following bolus administration of
intravenous contrast.
CONTRAST:  80 cc Isovue 300

[Series 2: routine abd pel with · axial · 0.70mm/px · z∈[-466,-26]mm · 13 of 96 slices shown, 15 images]
[im 4/96  soft-tissue]
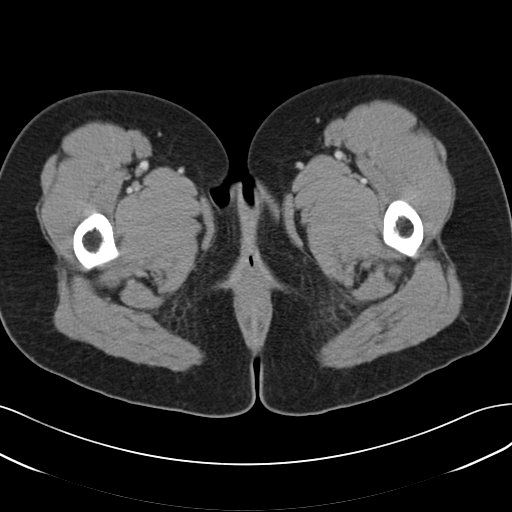
[im 4/96  bone]
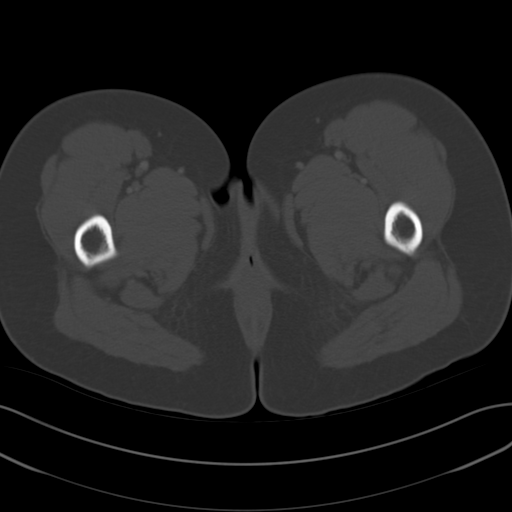
[im 12/96  soft-tissue]
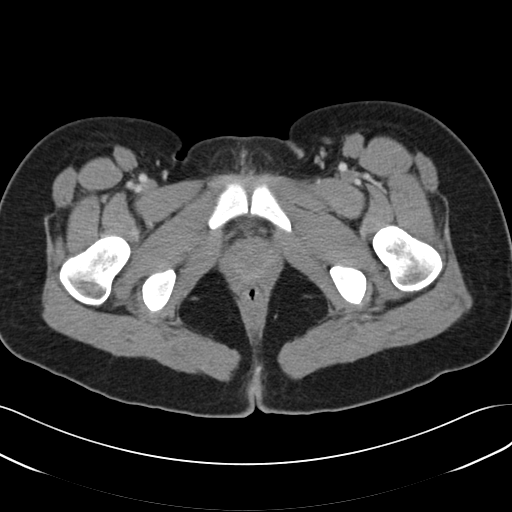
[im 20/96  soft-tissue]
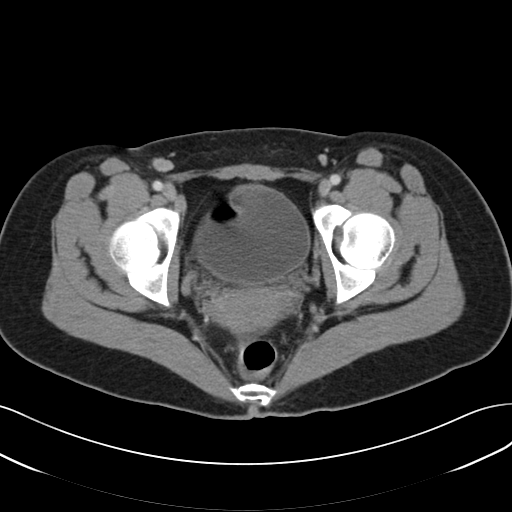
[im 27/96  soft-tissue]
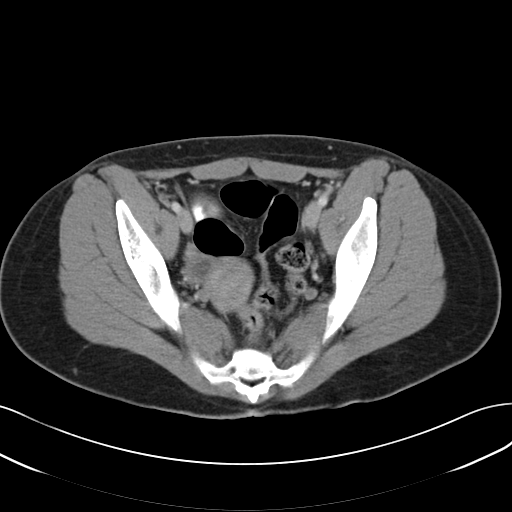
[im 35/96  soft-tissue]
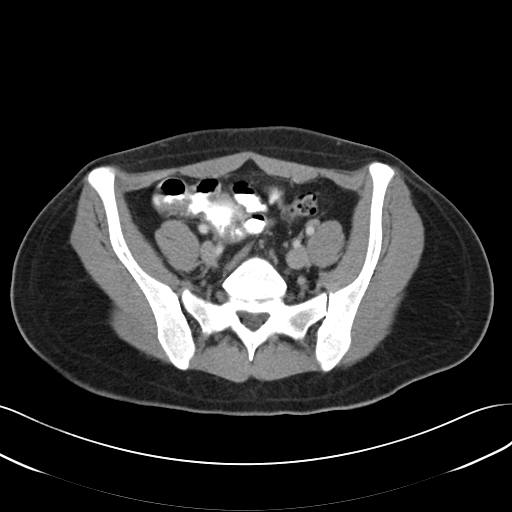
[im 42/96  soft-tissue]
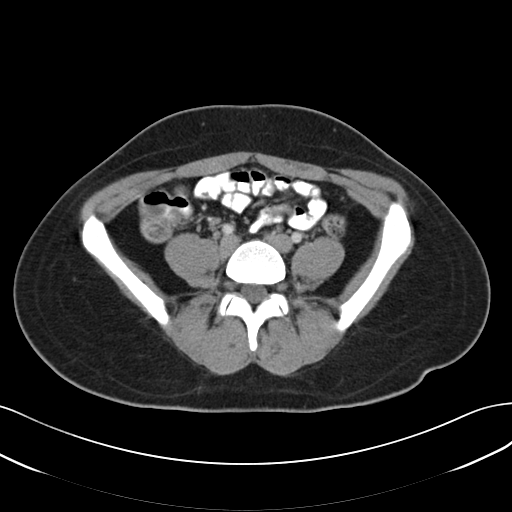
[im 50/96  soft-tissue]
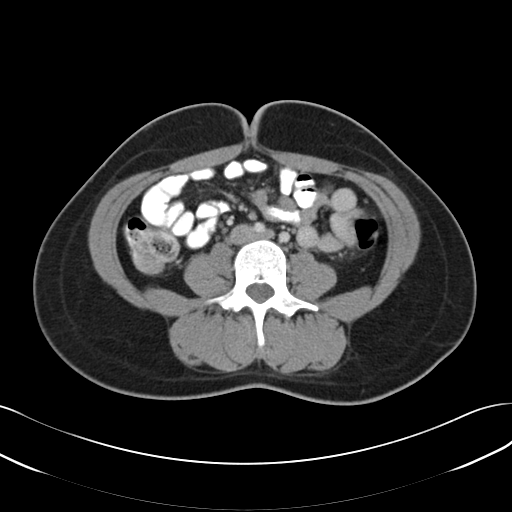
[im 54/96  soft-tissue]
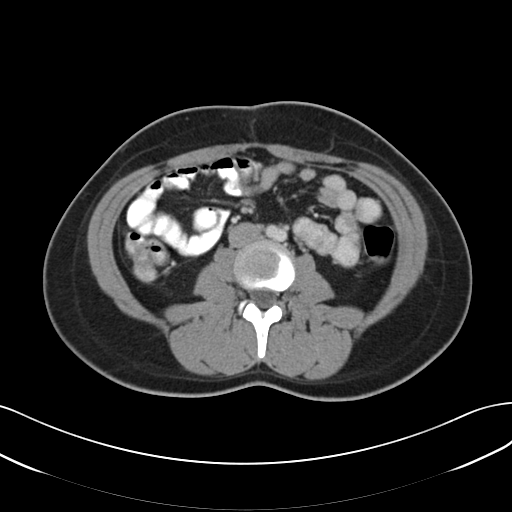
[im 61/96  soft-tissue]
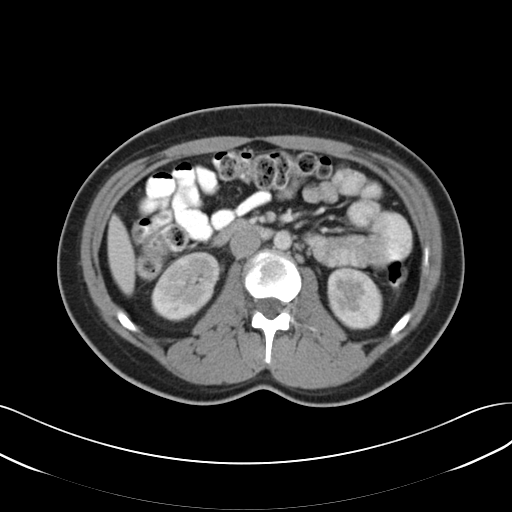
[im 61/96  bone]
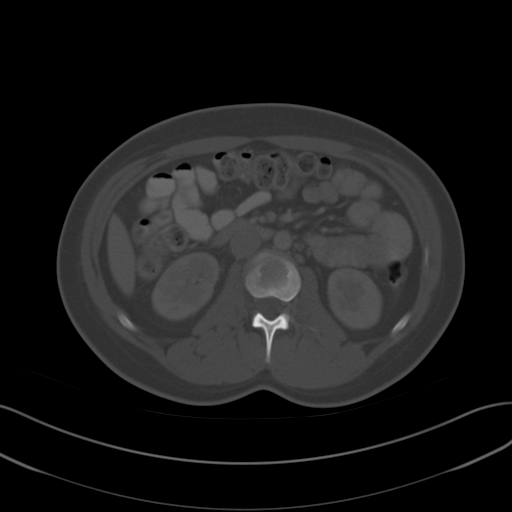
[im 69/96  soft-tissue]
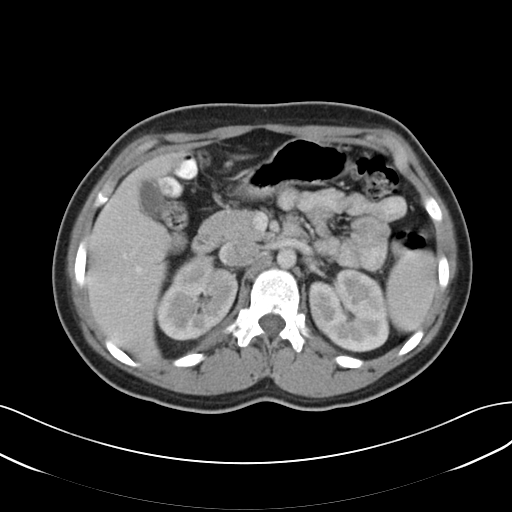
[im 77/96  soft-tissue]
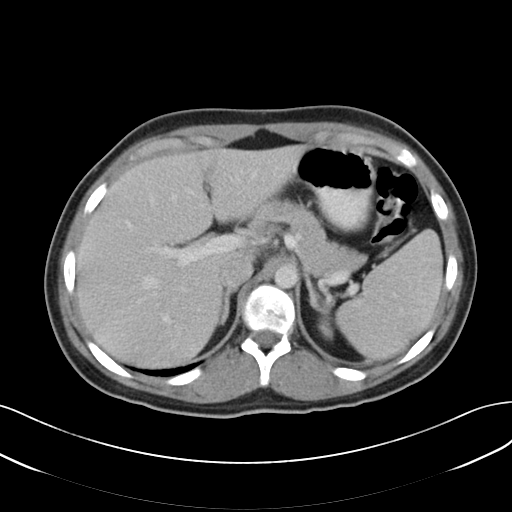
[im 84/96  soft-tissue]
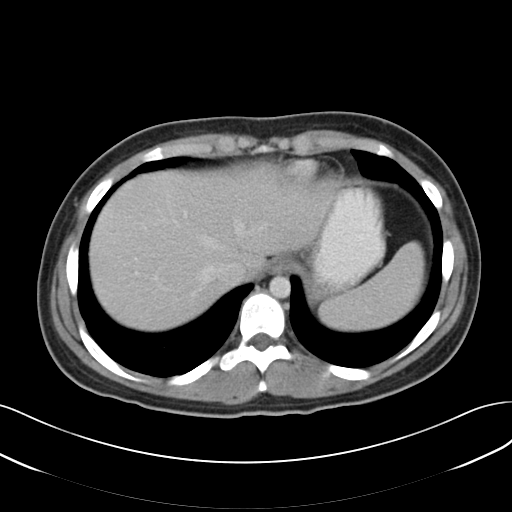
[im 92/96  soft-tissue]
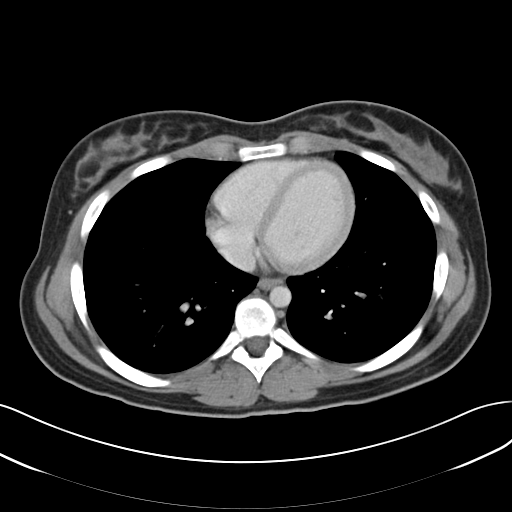

[Series 5: cor routine abd pel with · coronal · 0.67mm/px · 3 of 101 slices shown]
[im 34/101  soft-tissue]
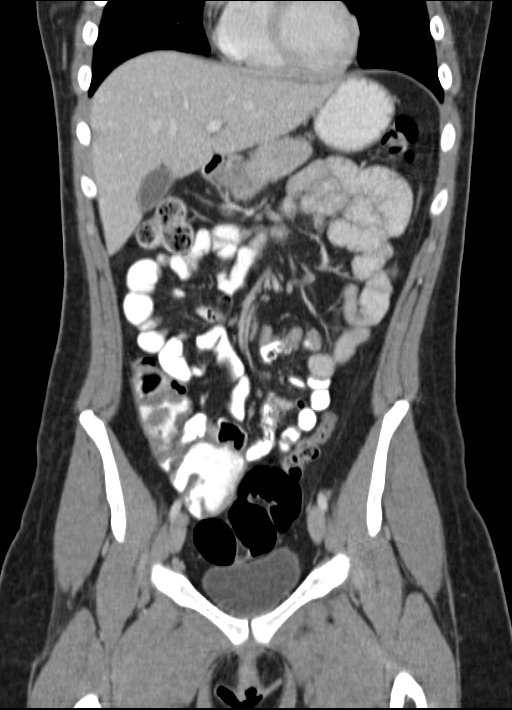
[im 45/101  soft-tissue]
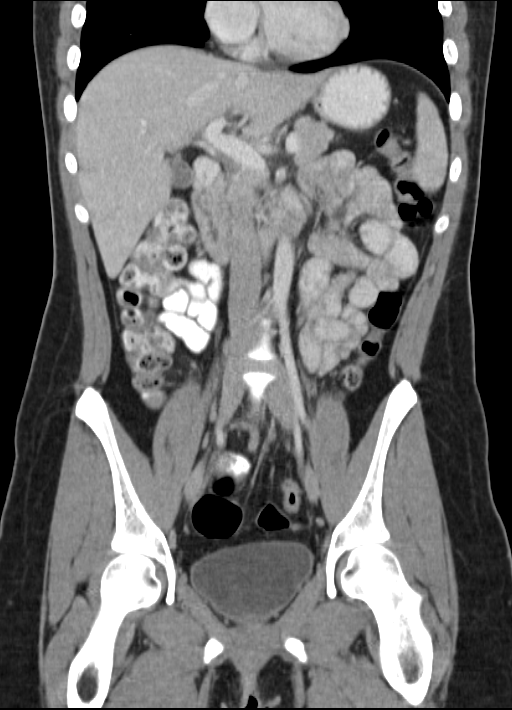
[im 56/101  soft-tissue]
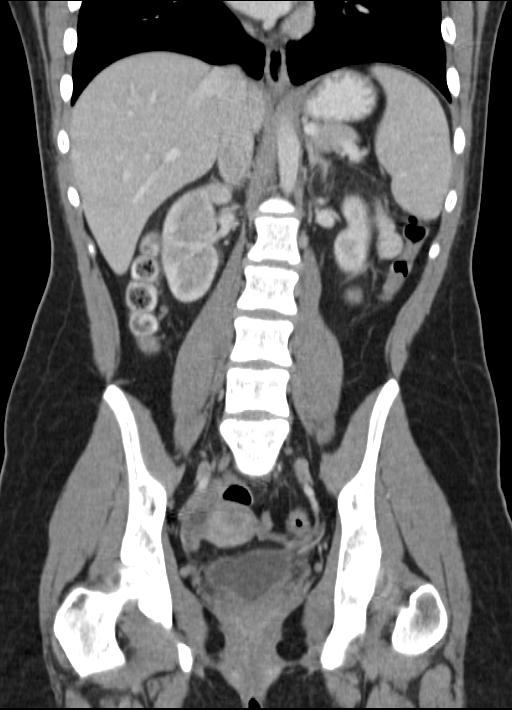

[16 of 46 positions shown; findings below may reference images not displayed]

FINDINGS: LUNG BASES: Included view of the lung bases are clear. Visualized
heart and pericardium are unremarkable.

SOLID ORGANS: The liver, spleen, gallbladder, pancreas and adrenal
glands are unremarkable.

GASTROINTESTINAL TRACT: The stomach, small and large bowel are
normal in course and caliber without inflammatory changes. The
appendix is not discretely identified, however there are no
inflammatory changes in the right lower quadrant.

KIDNEYS/ URINARY TRACT: Kidneys are orthotopic, demonstrating
symmetric mildly striated nephrograms. 2 mm RIGHT interpolar renal
calculus. No hydronephrosis or solid renal masses. The unopacified
ureters are normal in course and caliber. Urinary bladder is
partially distended and unremarkable.

PERITONEUM/RETROPERITONEUM: Small amount of free fluid in the pelvis
is likely physiologic. Aortoiliac vessels are normal in course and
caliber. No lymphadenopathy by CT size criteria. Internal
reproductive organs are unremarkable.

SOFT TISSUE/OSSEOUS STRUCTURES: Nonsuspicious.
IMPRESSION: 2 mm nonobstructing RIGHT interpolar nephrolithiasis.

Mildly striated nephrograms bilaterally could reflect early
pyelonephritis.

  By: Brigitte Philistin

## 2016-06-02 ENCOUNTER — Encounter: Payer: Medicaid Other | Admitting: Obstetrics and Gynecology

## 2017-06-24 ENCOUNTER — Encounter: Payer: Self-pay | Admitting: Emergency Medicine

## 2017-06-24 ENCOUNTER — Emergency Department
Admission: EM | Admit: 2017-06-24 | Discharge: 2017-06-24 | Disposition: A | Discharge status: Home / Self Care | Payer: Self-pay | Attending: Emergency Medicine | Admitting: Emergency Medicine

## 2017-06-24 ENCOUNTER — Other Ambulatory Visit: Payer: Self-pay

## 2017-06-24 DIAGNOSIS — Z79899 Other long term (current) drug therapy: Secondary | ICD-10-CM | POA: Insufficient documentation

## 2017-06-24 DIAGNOSIS — J039 Acute tonsillitis, unspecified: Secondary | ICD-10-CM | POA: Insufficient documentation

## 2017-06-24 DIAGNOSIS — F17210 Nicotine dependence, cigarettes, uncomplicated: Secondary | ICD-10-CM | POA: Insufficient documentation

## 2017-06-24 LAB — POCT RAPID STREP A: STREPTOCOCCUS, GROUP A SCREEN (DIRECT): NEGATIVE

## 2017-06-24 MED ORDER — AMOXICILLIN 500 MG PO CAPS: 500.0000 mg | ORAL_CAPSULE | Freq: Two times a day (BID) | ORAL | 0 refills | Status: AC

## 2017-06-24 MED ORDER — DEXAMETHASONE SODIUM PHOSPHATE 10 MG/ML IJ SOLN
10.0000 mg | Freq: Once | INTRAMUSCULAR | Status: AC
  Administered 2017-06-24: 10 mg via INTRAMUSCULAR
  Filled 2017-06-24: qty 1

## 2017-06-24 NOTE — ED Notes (Signed)
Pt discharged to home.  Family member driving.  Discharge instructions reviewed.  Verbalized understanding.  No questions or concerns at this time.  Teach back verified.  Pt in NAD.  No items left in ED.   

## 2017-06-24 NOTE — ED Provider Notes (Signed)
[facility] Emergency Department Provider Note  ____________________________________________  Time seen: Approximately 12:32 PM  I have reviewed the triage vital signs and the nursing notes.   HISTORY  Chief Complaint Sore Throat    HPI Tamara Bishop is a 21 y.o. female that presents to the emergency department for evaluation of sore throat for 3 days.  She states that it is painful to swallow.  She can see white spots on her tonsils.  She is having some minimal nasal congestion and nonproductive cough.  Her best friend is also sick.  She is unsure of fever.  No chills, shortness of breath, chest pain, nausea, vomiting, abdominal pain.  Past Medical History:  Diagnosis Date  . Adjustment disorder with anxiety   . Allergic rhinitis   . Anxiety   . Cigarette smoker   . Depression   . Insomnia   . Tobacco abuse     Patient Active Problem List   Diagnosis Date Noted  . Depo-Provera contraceptive status 07/18/2015  . Acute pharyngitis 07/13/2015  . Preventative health care 06/02/2015  . Dyspepsia 06/02/2015  . Family history of lupus erythematosus 06/02/2015  . Adjustment disorder with anxiety   . Allergic rhinitis   . Insomnia   . Tobacco abuse   . Anxiety and depression 02/16/2015    Past Surgical History:  Procedure Laterality Date  . WISDOM TOOTH EXTRACTION      Prior to Admission medications   Medication Sig Start Date End Date Taking? Authorizing Provider  amoxicillin (AMOXIL) 500 MG capsule Take 1 capsule (500 mg total) by mouth 2 (two) times daily for 10 days. 06/24/17 07/04/17  Enid DerryWagner, Ronnell Makarewicz, PA-C  brompheniramine-pseudoephedrine-DM 30-2-10 MG/5ML syrup Take 10 mLs by mouth 4 (four) times daily as needed. 10/14/15   Hagler, Jami L, PA-C  fluticasone (FLONASE) 50 MCG/ACT nasal spray Place 2 sprays into both nostrils daily. 10/14/15   Hagler, Jami L, PA-C  medroxyPROGESTERone (DEPO-PROVERA) 150 MG/ML injection Inject 150 mg into the  muscle every 3 (three) months.    [provider]  norethindrone-ethinyl estradiol (JUNEL FE 1/20) 1-20 MG-MCG tablet Take 1 tablet by mouth daily. 08/19/15   Hildred Laserherry, Anika, MD    Allergies Azithromycin  Family History  Problem Relation Age of Onset  . Lupus Mother   . Diabetes Father   . Breast cancer Maternal Grandmother   . Cancer Maternal Grandmother         breast  . Diabetes Paternal Grandmother   . Diabetes Paternal Grandfather   . Lupus Sister   . Heart disease Maternal Grandfather     Social History Social History   Tobacco Use  . Smoking status: Current Every Day Smoker    Packs/day: 0.50    Years: 1.00    Pack years: 0.50    Types: Cigarettes  . Smokeless tobacco: Never Used  Substance Use Topics  . Alcohol use: No  . Drug use: No     Review of Systems  Constitutional: No chills Eyes: No visual changes. No discharge. Cardiovascular: No chest pain. Respiratory:  No SOB. Gastrointestinal: No abdominal pain.  No nausea, no vomiting.  No diarrhea.  No constipation. Musculoskeletal: Negative for musculoskeletal pain. Skin: Negative for rash, abrasions, lacerations, ecchymosis. Neurological: Negative for headaches.   ____________________________________________   PHYSICAL EXAM:  VITAL SIGNS: ED Triage Vitals  Enc Vitals Group     BP 06/24/17 1141 (!) 95/57     Pulse Rate 06/24/17 1141 72     Resp  06/24/17 1141 18     Temp 06/24/17 1141 98.3 F (36.8 C)     Temp Source 06/24/17 1141 Oral     SpO2 06/24/17 1141 100 %     Weight 06/24/17 1142 115 lb (52.2 kg)     Height 06/24/17 1142 5' 1.5" (1.562 m)     Head Circumference --      Peak Flow --      Pain Score 06/24/17 1140 5     Pain Loc --      Pain Edu? --      Excl. in GC? --      Constitutional: Alert and oriented. Well appearing and in no acute distress. Eyes: Conjunctivae are normal. PERRL. EOMI. No discharge. Head: Atraumatic. ENT: No frontal and maxillary sinus  tenderness.      Ears: Tympanic membranes pearly gray with good landmarks. No discharge.      Nose: No congestion/rhinnorhea.      Mouth/Throat: Mucous membranes are moist. Oropharynx erythematous. Tonsils enlarged 1+ bilaterally with exudates. Uvula midline. Neck: No stridor.   Hematological/Lymphatic/Immunilogical: No cervical lymphadenopathy. Cardiovascular: Normal rate, regular rhythm.  Good peripheral circulation. Respiratory: Normal respiratory effort without tachypnea or retractions. Lungs CTAB. Good air entry to the bases with no decreased or absent breath sounds. Gastrointestinal: Bowel sounds 4 quadrants. Soft and nontender to palpation. No guarding or rigidity. No palpable masses. No distention. Musculoskeletal: Full range of motion to all extremities. No gross deformities appreciated. Neurologic:  Normal speech and language. No gross focal neurologic deficits are appreciated.  Skin:  Skin is warm, dry and intact. No rash noted.    ____________________________________________   LABS (all labs ordered are listed, but only abnormal results are displayed)  Labs Reviewed  POCT RAPID STREP A   ____________________________________________  EKG   ____________________________________________  RADIOLOGY   No results found.  ____________________________________________    PROCEDURES  Procedure(s) performed:    Procedures    Medications  dexamethasone (DECADRON) injection 10 mg (10 mg Intramuscular Given 06/24/17 1243)     ____________________________________________   INITIAL IMPRESSION / ASSESSMENT AND PLAN / ED COURSE  Pertinent labs & imaging results that were available during my care of the patient were reviewed by me and considered in my medical decision making (see chart for details).  Review of the [place] CSRS was performed in accordance of the [facility] prior to dispensing any controlled drugs.  Patient's diagnosis is consistent with tonsillitis.  Vital  signs and exam are reassuring.  She was given IM Decadron.  Patient feels comfortable going home. Patient will be discharged home with prescriptions for amoxicillin, viscous lidocaine. Patient is to follow up with PCP as needed or otherwise directed. Patient is given ED precautions to return to the ED for any worsening or new symptoms.     ____________________________________________  FINAL CLINICAL IMPRESSION(S) / ED DIAGNOSES  Final diagnoses:  Tonsillitis        This chart was dictated using voice recognition software/Dragon. Despite best efforts to proofread, errors can occur which can change the meaning. Any change was purely unintentional.    Enid DerryWagner, Roselind Klus, PA-C 06/24/17 1330    Emily FilbertWilliams, Jonathan E, MD 06/24/17 [phone]

## 2017-06-24 NOTE — ED Triage Notes (Signed)
Sore throat x 3 days

## 2018-03-14 ENCOUNTER — Telehealth: Payer: Self-pay | Admitting: Certified Nurse Midwife

## 2018-03-14 NOTE — Telephone Encounter (Signed)
The patient called and stated that she would like to speak with a nurse as soon as possible in regards to her throwing up 10 times, and not being able to work today. The patient is experiencing extreme nausea. Please advise.

## 2018-03-15 ENCOUNTER — Other Ambulatory Visit: Payer: Self-pay | Admitting: Certified Nurse Midwife

## 2018-03-15 ENCOUNTER — Ambulatory Visit: Payer: Medicaid Other | Admitting: Certified Nurse Midwife

## 2018-03-15 ENCOUNTER — Other Ambulatory Visit (INDEPENDENT_AMBULATORY_CARE_PROVIDER_SITE_OTHER): Payer: Self-pay

## 2018-03-15 VITALS — BP 111/77 | HR 67 | Ht 61.5 in | Wt 134.2 lb

## 2018-03-15 DIAGNOSIS — N8311 Corpus luteum cyst of right ovary: Secondary | ICD-10-CM

## 2018-03-15 DIAGNOSIS — Z3A01 Less than 8 weeks gestation of pregnancy: Secondary | ICD-10-CM

## 2018-03-15 DIAGNOSIS — O3411 Maternal care for benign tumor of corpus uteri, first trimester: Secondary | ICD-10-CM

## 2018-03-15 DIAGNOSIS — N926 Irregular menstruation, unspecified: Secondary | ICD-10-CM

## 2018-03-15 DIAGNOSIS — Z3201 Encounter for pregnancy test, result positive: Secondary | ICD-10-CM

## 2018-03-15 DIAGNOSIS — O219 Vomiting of pregnancy, unspecified: Secondary | ICD-10-CM

## 2018-03-15 MED ORDER — DOXYLAMINE-PYRIDOXINE 10-10 MG PO TBEC: 2.0000 | DELAYED_RELEASE_TABLET | Freq: Every day | ORAL | 5 refills | Status: DC

## 2018-03-15 MED ORDER — ONDANSETRON HCL 4 MG/2ML IJ SOLN
4.0000 mg | Freq: Once | INTRAMUSCULAR | Status: AC
  Administered 2018-03-15: 4 mg via INTRAMUSCULAR

## 2018-03-15 NOTE — Telephone Encounter (Signed)
Error

## 2018-03-15 NOTE — Progress Notes (Signed)
Pt states she had a confirmation of pregnancy done 03/08/18 at [facility]. Pt is unsure of LMP.

## 2018-03-15 NOTE — Progress Notes (Signed)
GYN ENCOUNTER NOTE  Subjective:       Tamara HillClarissa J Bishop is a 22 y.o. [id number] female here for pregnancy confirmation.   Presents with confirmation from [facility]. Uncertain LMP. Reports nausea with multiple episodes of vomiting each day, breast tenderness, and intermittent lower abdominal cramping.   Denies difficulty breathing or respiratory distress, chest pain, dysuria, and leg pain or swelling.   Gynecologic History  Patient's last menstrual period was 01/22/2018 (approximate).  Contraception: none  Last Pap: due.   Obstetric History  OB History  Gravida Para Term Preterm AB Living  1 0 0 0 0 0  SAB TAB Ectopic Multiple Live Births  0 0 0 0      Past Medical History:  Diagnosis Date  . Adjustment disorder with anxiety   . Allergic rhinitis   . Anxiety   . Cigarette smoker   . Depression   . Insomnia   . Tobacco abuse     Past Surgical History:  Procedure Laterality Date  . WISDOM TOOTH EXTRACTION     Allergies  Allergen Reactions  . Azithromycin Other (See Comments)    Felt like her stomach was going to come out of her body    Social History   Socioeconomic History  . Marital status: Single    Spouse name: Not on file  . Number of children: Not on file  . Years of education: Not on file  . Highest education level: Not on file  Occupational History  . Not on file  Social Needs  . Financial resource strain: Not on file  . Food insecurity:    Worry: Not on file    Inability: Not on file  . Transportation needs:    Medical: Not on file    Non-medical: Not on file  Tobacco Use  . Smoking status: Current Every Day Smoker    Packs/day: 0.50    Years: 1.00    Pack years: 0.50    Types: Cigarettes  . Smokeless tobacco: Never Used  Substance and Sexual Activity  . Alcohol use: No  . Drug use: No  . Sexual activity: Yes    Birth control/protection: Injection  Lifestyle  . Physical activity:    Days per week: Not on file    Minutes per session: Not  on file  . Stress: Not on file  Relationships  . Social connections:    Talks on phone: Not on file    Gets together: Not on file    Attends religious service: Not on file    Active member of club or organization: Not on file    Attends meetings of clubs or organizations: Not on file    Relationship status: Not on file  . Intimate partner violence:    Fear of current or ex partner: Not on file    Emotionally abused: Not on file    Physically abused: Not on file    Forced sexual activity: Not on file  Other Topics Concern  . Not on file  Social History Narrative  . Not on file    Family History  Problem Relation Age of Onset  . Lupus Mother   . Diabetes Father   . Breast cancer Maternal Grandmother   . Cancer Maternal Grandmother         breast  . Diabetes Paternal Grandmother   . Diabetes Paternal Grandfather   . Lupus Sister   . Heart disease Maternal Grandfather     The following portions of the  patient's history were reviewed and updated as appropriate: allergies, current medications, past family history, past medical history, past social history, past surgical history and problem list.  Review of Systems  ROS negative except as noted above. Information obtained from patient.   Objective:   BP 111/77   Pulse 67   Ht 5' 1.5" (1.562 m)   Wt 134 lb 3 oz (60.9 kg)   LMP 01/22/2018 (Approximate)   BMI 24.94 kg/m    CONSTITUTIONAL: Well-developed, well-nourished female in no acute distress.   ULTRASOUND REPORT  Location: [facility] Date of Service:  03/15/2018  Indications: Dating/Viability Findings:  Mason JimSingleton intrauterine pregnancy is visualized with a CRL consistent with 6 2/[redacted] weeks gestation, giving an (U/S) EDD of 11/06/18. A clinical EDD has not been established, however, today's scan does not correlate with patient stated LMP of 01/22/18.  FHR: 126 BPM CRL measurement: 5.1 mm Yolk sac and early anatomy is normal.  Right Ovary measures 3.0  x 2.4 x 2.8 cm. It is normal in appearance. Left Ovary measures 2.3 x 2.0 x 1.3 cm. It is normal appearance. There is evidence of a corpus luteal cyst in the right ovary. Survey of the adnexa demonstrates no adnexal masses. There is no free peritoneal fluid in the cul de sac.  Impression: 1. 6 2/7 week Viable Singleton Intrauterine pregnancy by U/S. 2. A clinical EDD has not been established, so today's ultrasound should be used.  Recommendations: 1.Clinical correlation with the patient's History and Physical Exam. 2. Today's ultrasound should be used to establish EDD of 11/06/18.   Assessment:   1. Missed menses - POCT urine pregnancy  Plan:   First trimester education; see AVS.   IM Zofran given in office for nausea and vomiting, see orders.   Rx: Diclegis, see orders.   Ultrasound findings reviewed with patient and significant other.   Reviewed red flag symptoms and when to call.   RTC x 2-3 weeks for Nurse Intake or sooner if needed.

## 2018-03-15 NOTE — Patient Instructions (Addendum)
Morning Sickness Morning sickness is when you feel sick to your stomach (nauseous) during pregnancy. This nauseous feeling may or may not come with vomiting. It often occurs in the morning but can be a problem any time of day. Morning sickness is most common during the first trimester, but it may continue throughout pregnancy. While morning sickness is unpleasant, it is usually harmless unless you develop severe and continual vomiting (hyperemesis gravidarum). This condition requires more intense treatment. What are the causes? The cause of morning sickness is not completely known but seems to be related to normal hormonal changes that occur in pregnancy. What increases the risk? You are at greater risk if you:  Experienced nausea or vomiting before your pregnancy.  Had morning sickness during a previous pregnancy.  Are pregnant with more than one baby, such as twins.  How is this treated? Do not use any medicines (prescription, over-the-counter, or herbal) for morning sickness without first talking to your health care provider. Your health care provider may prescribe or recommend:  Vitamin B6 supplements.  Anti-nausea medicines.  The herbal medicine ginger.  Follow these instructions at home:  Only take over-the-counter or prescription medicines as directed by your health care provider.  Taking multivitamins before getting pregnant can prevent or decrease the severity of morning sickness in most women.  Eat a piece of dry toast or unsalted crackers before getting out of bed in the morning.  Eat five or six small meals a day.  Eat dry and bland foods (rice, baked potato). Foods high in carbohydrates are often helpful.  Do not drink liquids with your meals. Drink liquids between meals.  Avoid greasy, fatty, and spicy foods.  Get someone to cook for you if the smell of any food causes nausea and vomiting.  If you feel nauseous after taking prenatal vitamins, take the vitamins  at night or with a snack.  Snack on protein foods (nuts, yogurt, cheese) between meals if you are hungry.  Eat unsweetened gelatins for desserts.  Wearing an acupressure wristband (worn for sea sickness) may be helpful.  Acupuncture may be helpful.  Do not smoke.  Get a humidifier to keep the air in your house free of odors.  Get plenty of fresh air. Contact a health care provider if:  Your home remedies are not working, and you need medicine.  You feel dizzy or lightheaded.  You are losing weight. Get help right away if:  You have persistent and uncontrolled nausea and vomiting.  You pass out (faint). This information is not intended to replace advice given to you by your health care provider. Make sure you discuss any questions you have with your health care provider. Document Released: 09/08/2006 Document Revised: 12/24/2015 Document Reviewed: 01/02/2013 Elsevier Interactive Patient Education  2017 [employer] for Pregnant Women While you are pregnant, your body will require additional nutrition to help support your growing baby. It is recommended that you consume:  150 additional calories each day during your first trimester.  300 additional calories each day during your second trimester.  300 additional calories each day during your third trimester.  Eating a healthy, well-balanced diet is very important for your health and for your baby's health. You also have a higher need for some vitamins and minerals, such as folic acid, calcium, iron, and vitamin D. What do I need to know about eating during pregnancy?  Do not try to lose weight or go on a diet during pregnancy.  Choose healthy, nutritious  foods. Choose  of a sandwich with a glass of milk instead of a candy bar or a high-calorie sugar-sweetened beverage.  Limit your overall intake of foods that have "empty calories." These are foods that have little nutritional value, such as sweets, desserts,  candies, sugar-sweetened beverages, and fried foods.  Eat a variety of foods, especially fruits and vegetables.  Take a prenatal vitamin to help meet the additional needs during pregnancy, specifically for folic acid, iron, calcium, and vitamin D.  Remember to stay active. Ask your health care provider for exercise recommendations that are specific to you.  Practice good food safety and cleanliness, such as washing your hands before you eat and after you prepare raw meat. This helps to prevent foodborne illnesses, such as listeriosis, that can be very dangerous for your baby. Ask your health care provider for more information about listeriosis. What does 150 extra calories look like? Healthy options for an additional 150 calories each day could be any of the following:  Plain low-fat yogurt (6-8 oz) with  cup of berries.  1 apple with 2 teaspoons of peanut butter.  Cut-up vegetables with  cup of hummus.  Low-fat chocolate milk (8 oz or 1 cup).  1 string cheese with 1 medium orange.   of a peanut butter and jelly sandwich on whole-wheat bread (1 tsp of peanut butter).  For 300 calories, you could eat two of those healthy options each day. What is a healthy amount of weight to gain? The recommended amount of weight for you to gain is based on your pre-pregnancy BMI. If your pre-pregnancy BMI was:  Less than 18 (underweight), you should gain 28-40 lb.  18-24.9 (normal), you should gain 25-35 lb.  25-29.9 (overweight), you should gain 15-25 lb.  Greater than 30 (obese), you should gain 11-20 lb.  What if I am having twins or multiples? Generally, pregnant women who will be having twins or multiples may need to increase their daily calories by 300-600 calories each day. The recommended range for total weight gain is 25-54 lb, depending on your pre-pregnancy BMI. Talk with your health care provider for specific guidance about additional nutritional needs, weight gain, and exercise  during your pregnancy. What foods can I eat? Grains Any grains. Try to choose whole grains, such as whole-wheat bread, oatmeal, or brown rice. Vegetables Any vegetables. Try to eat a variety of colors and types of vegetables to get a full range of vitamins and minerals. Remember to wash your vegetables well before eating. Fruits Any fruits. Try to eat a variety of colors and types of fruit to get a full range of vitamins and minerals. Remember to wash your fruits well before eating. Meats and Other Protein Sources Lean meats, including chicken, [place], fish, and lean cuts of beef, veal, or pork. Make sure that all meats are cooked to "well done." Tofu. Tempeh. Beans. Eggs. Peanut butter and other nut butters. Seafood, such as shrimp, crab, and lobster. If you choose fish, select types that are higher in omega-3 fatty acids, including salmon, herring, mussels, trout, sardines, and pollock. Make sure that all meats are cooked to food-safe temperatures. Dairy Pasteurized milk and milk alternatives. Pasteurized yogurt and pasteurized cheese. Cottage cheese. Sour cream. Beverages Water. Juices that contain 100% fruit juice or vegetable juice. Caffeine-free teas and decaffeinated coffee. Drinks that contain caffeine are okay to drink, but it is better to avoid caffeine. Keep your total caffeine intake to less than 200 mg each day (12 oz of  coffee, tea, or soda) or as directed by your health care provider. Condiments Any pasteurized condiments. Sweets and Desserts Any sweets and desserts. Fats and Oils Any fats and oils. The items listed above may not be a complete list of recommended foods or beverages. Contact your dietitian for more options. What foods are not recommended? Vegetables Unpasteurized (raw) vegetable juices. Fruits Unpasteurized (raw) fruit juices. Meats and Other Protein Sources Cured meats that have nitrates, such as bacon, salami, and hotdogs. Luncheon meats, bologna, or other  deli meats (unless they are reheated until they are steaming hot). Refrigerated pate, meat spreads from a meat counter, smoked seafood that is found in the refrigerated section of a store. Raw fish, such as sushi or sashimi. High mercury content fish, such as tilefish, shark, swordfish, and king mackerel. Raw meats, such as tuna or beef tartare. Undercooked meats and poultry. Make sure that all meats are cooked to food-safe temperatures. Dairy Unpasteurized (raw) milk and any foods that have raw milk in them. Soft cheeses, such as feta, queso blanco, queso fresco, Brie, Camembert cheeses, blue-veined cheeses, and Panela cheese (unless it is made with pasteurized milk, which must be stated on the label). Beverages Alcohol. Sugar-sweetened beverages, such as sodas, teas, or energy drinks. Condiments Homemade fermented foods and drinks, such as pickles, sauerkraut, or kombucha drinks. (Store-bought pasteurized versions of these are okay.) Other Salads that are made in the store, such as ham salad, chicken salad, egg salad, tuna salad, and seafood salad. The items listed above may not be a complete list of foods and beverages to avoid. Contact your dietitian for more information. This information is not intended to replace advice given to you by your health care provider. Make sure you discuss any questions you have with your health care provider. Document Released: 05/02/2014 Document Revised: 12/24/2015 Document Reviewed: 12/31/2013 Elsevier Interactive Patient Education  2018 [employer]. Common Medications Safe in Pregnancy  Acne:      Constipation:  Benzoyl Peroxide     Colace  Clindamycin      Dulcolax Suppository  Topica Erythromycin     Fibercon  Salicylic Acid      Metamucil         Miralax AVOID:        Senakot   Accutane    Cough:  Retin-A       Cough Drops  Tetracycline      Phenergan w/ Codeine if Rx  Minocycline      Robitussin (Plain &  DM)  Antibiotics:     Crabs/Lice:  Ceclor       RID  Cephalosporins    AVOID:  E-Mycins      Kwell  Keflex  Macrobid/Macrodantin   Diarrhea:  Penicillin      Kao-Pectate  Zithromax      Imodium AD         PUSH FLUIDS AVOID:       Cipro     Fever:  Tetracycline      Tylenol (Regular or Extra  Minocycline       Strength)  Levaquin      Extra Strength-Do not          Exceed 8 tabs/24 hrs Caffeine:        <261m/day (equiv. To 1 cup of coffee or  approx. 3 12 oz sodas)         Gas: Cold/Hayfever:       Gas-X  Benadryl      Mylicon  Claritin  Phazyme  **Claritin-D        Chlor-Trimeton    Headaches:  Dimetapp      ASA-Free Excedrin  Drixoral-Non-Drowsy     Cold Compress  Mucinex (Guaifenasin)     Tylenol (Regular or Extra  Sudafed/Sudafed-12 Hour     Strength)  **Sudafed PE Pseudoephedrine   Tylenol Cold & Sinus     Vicks Vapor Rub  Zyrtec  **AVOID if Problems With Blood Pressure         Heartburn: Avoid lying down for at least 1 hour after meals  Aciphex      Maalox     Rash:  Milk of Magnesia     Benadryl    Mylanta       1% Hydrocortisone Cream  Pepcid  Pepcid Complete   Sleep Aids:  Prevacid      Ambien   Prilosec       Benadryl  Rolaids       Chamomile Tea  Tums (Limit 4/day)     Unisom  Zantac       Tylenol PM         Warm milk-add vanilla or  Hemorrhoids:       Sugar for taste  Anusol/Anusol H.C.  (RX: Analapram 2.5%)  Sugar Substitutes:  Hydrocortisone OTC     Ok in moderation  Preparation H      Tucks        Vaseline lotion applied to tissue with wiping    Herpes:     Throat:  Acyclovir      Oragel  Famvir  Valtrex     Vaccines:         Flu Shot Leg Cramps:       *Gardasil  Benadryl      Hepatitis A         Hepatitis B Nasal Spray:       Pneumovax  Saline Nasal Spray     Pol[place] Booster         Tetanus Nausea:       Tuberculosis test or PPD  Vitamin B6 25 mg TID   AVOID:    Dramamine      *Gardasil  Emetrol       Live  Poliovirus  Ginger Root 250 mg QID    MMR (measles, mumps &  High Complex Carbs @ Bedtime    rebella)  Sea Bands-Accupressure    Varicella (Chickenpox)  Unisom 1/2 tab TID     *No known complications           If received before Pain:         Known pregnancy;   Darvocet       Resume series after  Lortab        Delivery  Percocet    Yeast:   Tramadol      Femstat  Tylenol 3      Gyne-lotrimin  Ultram       Monistat  Vicodin           MISC:         All Sunscreens           Hair Coloring/highlights          Insect Repellant's          (Including DEET)         Mystic Tans First Trimester of Pregnancy The first trimester of pregnancy is from week 1 until the end of week 13 (months 1 through 3). During this  time, your baby will begin to develop inside you. At 6-8 weeks, the eyes and face are formed, and the heartbeat can be seen on ultrasound. At the end of 12 weeks, all the baby's organs are formed. Prenatal care is all the medical care you receive before the birth of your baby. Make sure you get good prenatal care and follow all of your doctor's instructions. Follow these instructions at home: Medicines  Take over-the-counter and prescription medicines only as told by your doctor. Some medicines are safe and some medicines are not safe during pregnancy.  Take a prenatal vitamin that contains at least 600 micrograms (mcg) of folic acid.  If you have trouble pooping (constipation), take medicine that will make your stool soft (stool softener) if your doctor approves. Eating and drinking  Eat regular, healthy meals.  Your doctor will tell you the amount of weight gain that is right for you.  Avoid raw meat and uncooked cheese.  If you feel sick to your stomach (nauseous) or throw up (vomit): ? Eat 4 or 5 small meals a day instead of 3 large meals. ? Try eating a few soda crackers. ? Drink liquids between meals instead of during meals.  To prevent constipation: ? Eat foods that  are high in fiber, like fresh fruits and vegetables, whole grains, and beans. ? Drink enough fluids to keep your pee (urine) clear or pale yellow. Activity  Exercise only as told by your doctor. Stop exercising if you have cramps or pain in your lower belly (abdomen) or low back.  Do not exercise if it is too hot, too humid, or if you are in a place of great height (high altitude).  Try to avoid standing for long periods of time. Move your legs often if you must stand in one place for a long time.  Avoid heavy lifting.  Wear low-heeled shoes. Sit and stand up straight.  You can have sex unless your doctor tells you not to. Relieving pain and discomfort  Wear a good support bra if your breasts are sore.  Take warm water baths (sitz baths) to soothe pain or discomfort caused by hemorrhoids. Use hemorrhoid cream if your doctor says it is okay.  Rest with your legs raised if you have leg cramps or low back pain.  If you have puffy, bulging veins (varicose veins) in your legs: ? Wear support hose or compression stockings as told by your doctor. ? Raise (elevate) your feet for 15 minutes, 3-4 times a day. ? Limit salt in your food. Prenatal care  Schedule your prenatal visits by the twelfth week of pregnancy.  Write down your questions. Take them to your prenatal visits.  Keep all your prenatal visits as told by your doctor. This is important. Safety  Wear your seat belt at all times when driving.  Make a list of emergency phone numbers. The list should include numbers for family, friends, the hospital, and police and fire departments. General instructions  Ask your doctor for a referral to a local prenatal class. Begin classes no later than at the start of month 6 of your pregnancy.  Ask for help if you need counseling or if you need help with nutrition. Your doctor can give you advice or tell you where to go for help.  Do not use hot tubs, steam rooms, or saunas.  Do not  douche or use tampons or scented sanitary pads.  Do not cross your legs for long periods of time.  Avoid all herbs and alcohol. Avoid drugs that are not approved by your doctor.  Do not use any tobacco products, including cigarettes, chewing tobacco, and electronic cigarettes. If you need help quitting, ask your doctor. You may get counseling or other support to help you quit.  Avoid cat litter boxes and soil used by cats. These carry germs that can cause birth defects in the baby and can cause a loss of your baby (miscarriage) or stillbirth.  Visit your dentist. At home, brush your teeth with a soft toothbrush. Be gentle when you floss. Contact a doctor if:  You are dizzy.  You have mild cramps or pressure in your lower belly.  You have a nagging pain in your belly area.  You continue to feel sick to your stomach, you throw up, or you have watery poop (diarrhea).  You have a bad smelling fluid coming from your vagina.  You have pain when you pee (urinate).  You have increased puffiness (swelling) in your face, hands, legs, or ankles. Get help right away if:  You have a fever.  You are leaking fluid from your vagina.  You have spotting or bleeding from your vagina.  You have very bad belly cramping or pain.  You gain or lose weight rapidly.  You throw up blood. It may look like coffee grounds.  You are around people who have [place] measles, fifth disease, or chickenpox.  You have a very bad headache.  You have shortness of breath.  You have any kind of trauma, such as from a fall or a car accident. Summary  The first trimester of pregnancy is from week 1 until the end of week 13 (months 1 through 3).  To take care of yourself and your unborn baby, you will need to eat healthy meals, take medicines only if your doctor tells you to do so, and do activities that are safe for you and your baby.  Keep all follow-up visits as told by your doctor. This is important as  your doctor will have to ensure that your baby is healthy and growing well. This information is not intended to replace advice given to you by your health care provider. Make sure you discuss any questions you have with your health care provider. Document Released: 01/04/2008 Document Revised: 07/26/2016 Document Reviewed: 07/26/2016 Elsevier Interactive Patient Education  2017 [employer].

## 2018-03-26 ENCOUNTER — Encounter: Payer: Self-pay | Admitting: Certified Nurse Midwife

## 2018-04-03 ENCOUNTER — Telehealth: Payer: Self-pay

## 2018-04-03 ENCOUNTER — Emergency Department
Admission: EM | Admit: 2018-04-03 | Discharge: 2018-04-03 | Disposition: A | Discharge status: Home / Self Care | Payer: Medicaid Other | Attending: Emergency Medicine | Admitting: Emergency Medicine

## 2018-04-03 ENCOUNTER — Telehealth: Payer: Self-pay | Admitting: Certified Nurse Midwife

## 2018-04-03 ENCOUNTER — Encounter: Payer: Self-pay | Admitting: Certified Nurse Midwife

## 2018-04-03 ENCOUNTER — Other Ambulatory Visit: Payer: Self-pay

## 2018-04-03 DIAGNOSIS — Z3A09 9 weeks gestation of pregnancy: Secondary | ICD-10-CM | POA: Diagnosis not present

## 2018-04-03 DIAGNOSIS — O2341 Unspecified infection of urinary tract in pregnancy, first trimester: Secondary | ICD-10-CM | POA: Diagnosis not present

## 2018-04-03 DIAGNOSIS — O218 Other vomiting complicating pregnancy: Secondary | ICD-10-CM | POA: Diagnosis not present

## 2018-04-03 DIAGNOSIS — O219 Vomiting of pregnancy, unspecified: Secondary | ICD-10-CM | POA: Diagnosis not present

## 2018-04-03 DIAGNOSIS — O9989 Other specified diseases and conditions complicating pregnancy, childbirth and the puerperium: Secondary | ICD-10-CM

## 2018-04-03 DIAGNOSIS — F17210 Nicotine dependence, cigarettes, uncomplicated: Secondary | ICD-10-CM | POA: Insufficient documentation

## 2018-04-03 DIAGNOSIS — O99331 Smoking (tobacco) complicating pregnancy, first trimester: Secondary | ICD-10-CM | POA: Diagnosis not present

## 2018-04-03 DIAGNOSIS — O210 Mild hyperemesis gravidarum: Secondary | ICD-10-CM

## 2018-04-03 DIAGNOSIS — O99891 Other specified diseases and conditions complicating pregnancy: Secondary | ICD-10-CM

## 2018-04-03 DIAGNOSIS — R8271 Bacteriuria: Secondary | ICD-10-CM | POA: Diagnosis not present

## 2018-04-03 DIAGNOSIS — Z79899 Other long term (current) drug therapy: Secondary | ICD-10-CM | POA: Insufficient documentation

## 2018-04-03 DIAGNOSIS — O2391 Unspecified genitourinary tract infection in pregnancy, first trimester: Secondary | ICD-10-CM | POA: Diagnosis not present

## 2018-04-03 LAB — COMPREHENSIVE METABOLIC PANEL
ALBUMIN: 4 g/dL (ref 3.5–5.0)
ALT: 10 U/L (ref 0–44)
ANION GAP: 5 (ref 5–15)
AST: 18 U/L (ref 15–41)
Alkaline Phosphatase: 33 U/L — ABNORMAL LOW (ref 38–126)
BUN: 11 mg/dL (ref 6–20)
CALCIUM: 9.1 mg/dL (ref 8.9–10.3)
CHLORIDE: 108 mmol/L (ref 98–111)
CO2: 22 mmol/L (ref 22–32)
Creatinine, Ser: 0.42 mg/dL — ABNORMAL LOW (ref 0.44–1.00)
GFR calc Af Amer: >60 mL/min (ref >60)
GFR calc non Af Amer: >60 mL/min (ref >60)
GLUCOSE: 93 mg/dL (ref 70–99)
POTASSIUM: 3.9 mmol/L (ref 3.5–5.1)
SODIUM: 135 mmol/L (ref 135–145)
Total Bilirubin: 0.5 mg/dL (ref 0.3–1.2)
Total Protein: 7.2 g/dL (ref 6.5–8.1)

## 2018-04-03 LAB — HCG, QUANTITATIVE, PREGNANCY: HCG, BETA CHAIN, QUANT, S: 139652 m[IU]/mL — AB (ref <5)

## 2018-04-03 LAB — URINALYSIS, COMPLETE (UACMP) WITH MICROSCOPIC
Bilirubin Urine: NEGATIVE
Glucose, UA: NEGATIVE mg/dL
Hgb urine dipstick: NEGATIVE
Ketones, ur: NEGATIVE mg/dL
Nitrite: NEGATIVE
PROTEIN: NEGATIVE mg/dL
Specific Gravity, Urine: 1.023 (ref 1.005–1.030)
pH: 6 (ref 5.0–8.0)

## 2018-04-03 LAB — LIPASE, BLOOD: LIPASE: 33 U/L (ref 11–51)

## 2018-04-03 LAB — CBC
HEMATOCRIT: 37.4 % (ref 35.0–47.0)
HEMOGLOBIN: 13 g/dL (ref 12.0–16.0)
MCH: 29.8 pg (ref 26.0–34.0)
MCHC: 34.8 g/dL (ref 32.0–36.0)
MCV: 85.4 fL (ref 80.0–100.0)
Platelets: 191 10*3/uL (ref 150–440)
RBC: 4.37 MIL/uL (ref 3.80–5.20)
RDW: 13.5 % (ref 11.5–14.5)
WBC: 15.5 10*3/uL — ABNORMAL HIGH (ref 3.6–11.0)

## 2018-04-03 LAB — BETA-HYDROXYBUTYRIC ACID: Beta-Hydroxybutyric Acid: 0.09 mmol/L (ref 0.05–0.27)

## 2018-04-03 MED ORDER — DEXTROSE 50 % IV SOLN
1.0000 | Freq: Once | INTRAVENOUS | Status: AC
  Administered 2018-04-03: 50 mL via INTRAVENOUS
  Filled 2018-04-03: qty 50

## 2018-04-03 MED ORDER — METOCLOPRAMIDE HCL 5 MG/ML IJ SOLN
10.0000 mg | Freq: Once | INTRAMUSCULAR | Status: AC
  Administered 2018-04-03: 10 mg via INTRAVENOUS
  Filled 2018-04-03: qty 2

## 2018-04-03 MED ORDER — NITROFURANTOIN MONOHYD MACRO 100 MG PO CAPS: 100.0000 mg | ORAL_CAPSULE | Freq: Two times a day (BID) | ORAL | 0 refills | Status: AC

## 2018-04-03 MED ORDER — SODIUM CHLORIDE 0.9 % IV BOLUS
1000.0000 mL | Freq: Once | INTRAVENOUS | Status: AC
  Administered 2018-04-03: 1000 mL via INTRAVENOUS

## 2018-04-03 MED ORDER — METOCLOPRAMIDE HCL 10 MG PO TABS: 10.0000 mg | ORAL_TABLET | Freq: Three times a day (TID) | ORAL | 0 refills | Status: DC

## 2018-04-03 NOTE — ED Provider Notes (Signed)
[facility] Emergency Department Provider Note  ____________________________________________   First MD Initiated Contact with Patient 04/03/18 1516     (approximate)  I have reviewed the triage vital signs and the nursing notes.   HISTORY  Chief Complaint Emesis During Pregnancy   HPI Tamara Bishop is a 22 y.o. female self presents to the emergency department with 1 day of nausea vomiting and inability to keep food down.  She is roughly [redacted] weeks pregnant with her first pregnancy.  Followed at [facility].  She had previously taken Bojenta for nausea and morning sickness although it did not particularly work so she was recently prescribed likely just which she cannot afford.  She became concerned that she could be dehydrated as she has not been able to keep anything down in the last 9 hours or so.  Her vomiting is associated with moderate severity cramping upper abdominal pain nonradiating.  The pain does improve after vomiting.   Past Medical History:  Diagnosis Date  . Adjustment disorder with anxiety   . Allergic rhinitis   . Anxiety   . Cigarette smoker   . Depression   . Insomnia   . Tobacco abuse     Patient Active Problem List   Diagnosis Date Noted  . Acute pharyngitis 07/13/2015  . Dyspepsia 06/02/2015  . Family history of lupus erythematosus 06/02/2015  . Adjustment disorder with anxiety   . Allergic rhinitis   . Insomnia   . Tobacco abuse   . Anxiety and depression 02/16/2015    Past Surgical History:  Procedure Laterality Date  . WISDOM TOOTH EXTRACTION      Prior to Admission medications   Medication Sig Start Date End Date Taking? Authorizing Provider  Doxylamine-Pyridoxine (DICLEGIS) 10-10 MG TBEC Take 2 tablets by mouth at bedtime. If symptoms persist, add one tablet in the morning and one in the afternoon 03/15/18   Lawhorn, Vanessa [place], CNM  metoCLOPramide (REGLAN) 10 MG tablet Take 1 tablet (10 mg total) by mouth 3  (three) times daily with meals. 04/03/18 04/03/19  Merrily Brittle, MD  nitrofurantoin, macrocrystal-monohydrate, (MACROBID) 100 MG capsule Take 1 capsule (100 mg total) by mouth 2 (two) times daily for 3 days. 04/03/18 04/06/18  Merrily Brittle, MD  promethazine (PHENERGAN) 25 MG tablet Take 0.5 tablets (12.5 mg total) by mouth every 6 (six) hours as needed for nausea or vomiting. 04/04/18   [name], CNM    Allergies Azithromycin  Family History  Problem Relation Age of Onset  . Lupus Mother   . Diabetes Father   . Breast cancer Maternal Grandmother   . Cancer Maternal Grandmother         breast  . Diabetes Paternal Grandmother   . Diabetes Paternal Grandfather   . Lupus Sister   . Heart disease Maternal Grandfather     Social History Social History   Tobacco Use  . Smoking status: Current Every Day Smoker    Packs/day: 0.50    Years: 1.00    Pack years: 0.50    Types: Cigarettes  . Smokeless tobacco: Never Used  Substance Use Topics  . Alcohol use: No  . Drug use: No    Review of Systems Constitutional: No fever/chills Eyes: No visual changes. ENT: No sore throat. Cardiovascular: Denies chest pain. Respiratory: Denies shortness of breath. Gastrointestinal: Positive for abdominal pain.  Positive for nausea, positive for vomiting.  No diarrhea.  No constipation. Genitourinary: Negative for dysuria. Musculoskeletal: Negative for back pain. Skin:  Negative for rash. Neurological: Negative for headaches, focal weakness or numbness.   ____________________________________________   PHYSICAL EXAM:  VITAL SIGNS: ED Triage Vitals  Enc Vitals Group     BP 04/03/18 1455 117/75     Pulse Rate 04/03/18 1455 88     Resp --      Temp 04/03/18 1455 98.6 F (37 C)     Temp Source 04/03/18 1455 Oral     SpO2 04/03/18 1455 99 %     Weight --      Height --      Head Circumference --      Peak Flow --      Pain Score 04/03/18 1458 4     Pain Loc --      Pain Edu? --        Excl. in GC? --     Constitutional: Alert and oriented x4 appears obviously nauseated and uncomfortable holding an emesis bag Eyes: PERRL EOMI. Head: Atraumatic. Nose: No congestion/rhinnorhea. Mouth/Throat: No trismus Neck: No stridor.   Cardiovascular: Normal rate, regular rhythm. Grossly normal heart sounds.  Good peripheral circulation. Respiratory: Normal respiratory effort.  No retractions. Lungs CTAB and moving good air Gastrointestinal: Soft nondistended nontender no rebound no guarding no peritonitis Musculoskeletal: No lower extremity edema   Neurologic:  Normal speech and language. No gross focal neurologic deficits are appreciated. Skin:  Skin is warm, dry and intact. No rash noted. Psychiatric: Mood and affect are normal. Speech and behavior are normal.    ____________________________________________   DIFFERENTIAL includes but not limited to  Morning sickness, dehydration, hyperemesis gravidarum ____________________________________________   LABS (all labs ordered are listed, but only abnormal results are displayed)  Labs Reviewed  COMPREHENSIVE METABOLIC PANEL - Abnormal; Notable for the following components:      Result Value   Creatinine, Ser 0.42 (*)    Alkaline Phosphatase 33 (*)    All other components within normal limits  CBC - Abnormal; Notable for the following components:   WBC 15.5 (*)    All other components within normal limits  URINALYSIS, COMPLETE (UACMP) WITH MICROSCOPIC - Abnormal; Notable for the following components:   Color, Urine YELLOW (*)    APPearance CLOUDY (*)    Leukocytes, UA LARGE (*)    Bacteria, UA FEW (*)    All other components within normal limits  HCG, QUANTITATIVE, PREGNANCY - Abnormal; Notable for the following components:   hCG, Beta Chain, [place] Longest 161,096 (*)    All other components within normal limits  LIPASE, BLOOD  BETA-HYDROXYBUTYRIC ACID    Lab work reviewed by me shows elevated white count likely  secondary to pain and vomiting.  She does have asymptomatic bacteriuria __________________________________________  EKG   ____________________________________________  RADIOLOGY   ____________________________________________   PROCEDURES  Procedure(s) performed: no  Procedures  Critical Care performed: no  ____________________________________________   INITIAL IMPRESSION / ASSESSMENT AND PLAN / ED COURSE  Pertinent labs & imaging results that were available during my care of the patient were reviewed by me and considered in my medical decision making (see chart for details).   As part of my medical decision making, I reviewed the following data within the electronic MEDICAL RECORD NUMBER History obtained from family if available, nursing notes, old chart and ekg, as well as notes from prior ED visits.  The patient has nausea and vomiting in the setting of first trimester pregnancy.  Given IV fluids with dextrose and 20 mg of Reglan intravenously with  near complete resolution of her symptoms.  We discussed treatment for asymptomatic bacteriuria.  I will prescribe her Reglan for home as it seemed to help and refer her back to [facility] gynecology.      ____________________________________________   FINAL CLINICAL IMPRESSION(S) / ED DIAGNOSES  Final diagnoses:  Morning sickness  Asymptomatic bacteriuria during pregnancy      NEW MEDICATIONS STARTED DURING THIS VISIT:  Discharge Medication List as of 04/03/2018  4:17 PM    START taking these medications   Details  metoCLOPramide (REGLAN) 10 MG tablet Take 1 tablet (10 mg total) by mouth 3 (three) times daily with meals., Starting Tue 04/03/2018, Until Wed 04/03/2019, Print    nitrofurantoin, macrocrystal-monohydrate, (MACROBID) 100 MG capsule Take 1 capsule (100 mg total) by mouth 2 (two) times daily for 3 days., Starting Tue 04/03/2018, Until Fri 04/06/2018, Print         Note:  This document was prepared using Dragon voice  recognition software and may include unintentional dictation errors.     Merrily Brittle, MD 04/05/18 [phone]

## 2018-04-03 NOTE — Discharge Instructions (Addendum)
Instead of diclygis you can take over the counter vitamin B6 up to 3 times a day and take unisom (dicyclomine) at night.  Make sure you remain well-hydrated with lots of small sips of fluids and return to the emergency department for any concerns.  It was a pleasure to take care of you today, and thank you for coming to our emergency department.  If you have any questions or concerns before leaving please ask the nurse to grab me and I'm more than happy to go through your aftercare instructions again.  If you were prescribed any opioid pain medication today such as Norco, Vicodin, Percocet, morphine, hydrocodone, or oxycodone please make sure you do not drive when you are taking this medication as it can alter your ability to drive safely.  If you have any concerns once you are home that you are not improving or are in fact getting worse before you can make it to your follow-up appointment, please do not hesitate to call 911 and come back for further evaluation.  Merrily Brittle, MD  Results for orders placed or performed during the hospital encounter of 04/03/18  Lipase, blood  Result Value Ref Range   Lipase 33 11 - 51 U/L  Comprehensive metabolic panel  Result Value Ref Range   Sodium 135 135 - 145 mmol/L   Potassium 3.9 3.5 - 5.1 mmol/L   Chloride 108 98 - 111 mmol/L   CO2 22 22 - 32 mmol/L   Glucose, Bld 93 70 - 99 mg/dL   BUN 11 6 - 20 mg/dL   Creatinine, Ser 9.81 (L) 0.44 - 1.00 mg/dL   Calcium 9.1 8.9 - 19.1 mg/dL   Total Protein 7.2 6.5 - 8.1 g/dL   Albumin 4.0 3.5 - 5.0 g/dL   AST 18 15 - 41 U/L   ALT 10 0 - 44 U/L   Alkaline Phosphatase 33 (L) 38 - 126 U/L   Total Bilirubin 0.5 0.3 - 1.2 mg/dL   GFR calc non Af Amer >60 >60 mL/min   GFR calc Af Amer >60 >60 mL/min   Anion gap 5 5 - 15  CBC  Result Value Ref Range   WBC 15.5 (H) 3.6 - 11.0 K/uL   RBC 4.37 3.80 - 5.20 MIL/uL   Hemoglobin 13.0 12.0 - 16.0 g/dL   HCT 47.8 29.5 - 62.1 %   MCV 85.4 80.0 - 100.0 fL   MCH  29.8 26.0 - 34.0 pg   MCHC 34.8 32.0 - 36.0 g/dL   RDW 30.8 65.7 - 84.6 %   Platelets 191 150 - 440 K/uL  Urinalysis, Complete w Microscopic  Result Value Ref Range   Color, Urine YELLOW (A) YELLOW   APPearance CLOUDY (A) CLEAR   Specific Gravity, Urine 1.023 1.005 - 1.030   pH 6.0 5.0 - 8.0   Glucose, UA NEGATIVE NEGATIVE mg/dL   Hgb urine dipstick NEGATIVE NEGATIVE   Bilirubin Urine NEGATIVE NEGATIVE   Ketones, ur NEGATIVE NEGATIVE mg/dL   Protein, ur NEGATIVE NEGATIVE mg/dL   Nitrite NEGATIVE NEGATIVE   Leukocytes, UA LARGE (A) NEGATIVE   RBC / HPF 0-5 0 - 5 RBC/hpf   WBC, UA 21-50 0 - 5 WBC/hpf   Bacteria, UA FEW (A) NONE SEEN   Squamous Epithelial / LPF 21-50 0 - 5   Mucus PRESENT    US Ob Transvaginal  Result Date: 03/20/2018 ULTRASOUND REPORT Location: [facility] Date of Service:  03/15/2018 Indications: Dating/Viability Findings: Tamara Bishop intrauterine pregnancy  is visualized with a CRL consistent with 6 2/[redacted] weeks gestation, giving an (U/S) EDD of 11/06/18. A clinical EDD has not been established, however, today's scan does not correlate with patient stated LMP of 01/22/18. FHR: 126 BPM CRL measurement: 5.1 mm Yolk sac and early anatomy is normal. Right Ovary measures 3.0 x 2.4 x 2.8 cm. It is normal in appearance. Left Ovary measures 2.3 x 2.0 x 1.3 cm. It is normal appearance. There is evidence of a corpus luteal cyst in the right ovary. Survey of the adnexa demonstrates no adnexal masses. There is no free peritoneal fluid in the cul de sac. Impression: 1. 6 2/7 week Viable Singleton Intrauterine pregnancy by U/S. 2. A clinical EDD has not been established, so today's ultrasound should be used. Recommendations: 1.Clinical correlation with the patient's History and Physical Exam. 2. Today's ultrasound should be used to establish EDD of 11/06/18. Kari Baars, RDMS The ultrasound images and findings were reviewed by me and I agree with the above report. Elonda Husky, M.D.  03/20/2018 11:53 AM

## 2018-04-03 NOTE — ED Triage Notes (Addendum)
Pt is alert, oriented, ambulatory. States [redacted] weeks pregnant. States vomiting, unable to keep liquids down. Vomiting since 3am. State R sided pain. No distress noted. Is seeing [facility] for OB. 1st pregnancy. Pt states MD found a cyst on R ovary.

## 2018-04-03 NOTE — Telephone Encounter (Signed)
Pt went to [facility].

## 2018-04-03 NOTE — Telephone Encounter (Signed)
Patient vomiting and dizzy x1 day.  Not able to hold down anything.  Having cold chills as well.  She also saw some blood in her vomit.  Per nurse, some blood may be normal due to capillary stress during extensive vomiting.  Please advise, thanks.

## 2018-04-04 ENCOUNTER — Other Ambulatory Visit: Payer: Self-pay | Admitting: Certified Nurse Midwife

## 2018-04-04 ENCOUNTER — Other Ambulatory Visit: Payer: Self-pay

## 2018-04-04 MED ORDER — PROMETHAZINE HCL 25 MG PO TABS: 12.5000 mg | ORAL_TABLET | Freq: Four times a day (QID) | ORAL | 1 refills | Status: DC | PRN

## 2018-04-06 ENCOUNTER — Ambulatory Visit (INDEPENDENT_AMBULATORY_CARE_PROVIDER_SITE_OTHER): Payer: Medicaid Other | Admitting: Certified Nurse Midwife

## 2018-04-06 VITALS — BP 114/62 | HR 82 | Wt 139.4 lb

## 2018-04-06 DIAGNOSIS — Z3401 Encounter for supervision of normal first pregnancy, first trimester: Secondary | ICD-10-CM

## 2018-04-06 NOTE — Progress Notes (Signed)
Tamara Bishop presents for NOB nurse interview visit. Pregnancy confirmation done 03/15/18 [facility]______.  G- 1.  P- 0   . Pregnancy education material explained and given. _0__ cats in the home. NOB labs ordered. HIV labs and Drug screen were explained optional and she did not decline. Drug screen ordered/declined. PNV encouraged. Genetic screening options discussed. Genetic testing: Ordered.  Pt may discuss with provider. Pt. To follow up with provider in _2_ weeks for NOB physical.  All questions answered.

## 2018-04-07 LAB — CBC WITH DIFFERENTIAL
Basophils Absolute: 0.1 10*3/uL (ref 0.0–0.2)
Basos: 0 %
EOS (ABSOLUTE): 0.2 10*3/uL (ref 0.0–0.4)
Eos: 1 %
Hematocrit: 36.9 % (ref 34.0–46.6)
Hemoglobin: 12.7 g/dL (ref 11.1–15.9)
Immature Grans (Abs): 0.1 10*3/uL (ref 0.0–0.1)
Immature Granulocytes: 1 %
Lymphocytes Absolute: 2 10*3/uL (ref 0.7–3.1)
Lymphs: 14 %
MCH: 29.1 pg (ref 26.6–33.0)
MCHC: 34.4 g/dL (ref 31.5–35.7)
MCV: 85 fL (ref 79–97)
Monocytes Absolute: 0.9 10*3/uL (ref 0.1–0.9)
Monocytes: 6 %
Neutrophils Absolute: 11.7 10*3/uL — ABNORMAL HIGH (ref 1.4–7.0)
Neutrophils: 78 %
RBC: 4.36 x10E6/uL (ref 3.77–5.28)
RDW: 12.7 % (ref 12.3–15.4)
WBC: 15 10*3/uL — ABNORMAL HIGH (ref 3.4–10.8)

## 2018-04-07 LAB — URINALYSIS, ROUTINE W REFLEX MICROSCOPIC
Bilirubin, UA: NEGATIVE
Glucose, UA: NEGATIVE
KETONES UA: NEGATIVE
Leukocytes, UA: NEGATIVE
NITRITE UA: NEGATIVE
Protein, UA: NEGATIVE
RBC, UA: NEGATIVE
Specific Gravity, UA: 1.025 (ref 1.005–1.030)
UUROB: 0.2 mg/dL (ref 0.2–1.0)
pH, UA: 6 (ref 5.0–7.5)

## 2018-04-07 LAB — ABO AND RH: Rh Factor: POSITIVE

## 2018-04-07 LAB — OB RESULTS CONSOLE VARICELLA ZOSTER ANTIBODY, IGG: VARICELLA IGG: IMMUNE

## 2018-04-07 LAB — RUBELLA SCREEN: Rubella Antibodies, IGG: 1.27 index (ref >0.99)

## 2018-04-07 LAB — HIV ANTIBODY (ROUTINE TESTING W REFLEX): HIV Screen 4th Generation wRfx: NONREACTIVE

## 2018-04-07 LAB — VARICELLA ZOSTER ANTIBODY, IGG: Varicella zoster IgG: 609 index (ref >165)

## 2018-04-07 LAB — GC/CHLAMYDIA PROBE AMP
Chlamydia trachomatis, NAA: NEGATIVE
NEISSERIA GONORRHOEAE BY PCR: NEGATIVE

## 2018-04-07 LAB — ANTIBODY SCREEN: Antibody Screen: NEGATIVE

## 2018-04-07 LAB — HEPATITIS B SURFACE ANTIGEN: Hepatitis B Surface Ag: NEGATIVE

## 2018-04-07 LAB — RPR: RPR Ser Ql: NONREACTIVE

## 2018-04-08 LAB — URINE CULTURE: Organism ID, Bacteria: NO GROWTH

## 2018-04-08 NOTE — Progress Notes (Signed)
I have reviewed the record and concur with patient management and plan of care.    Alicianna Litchford Michelle Ellise Kovack, CNM [facility] 

## 2018-04-10 ENCOUNTER — Encounter: Payer: Self-pay | Admitting: Certified Nurse Midwife

## 2018-04-10 DIAGNOSIS — Z87898 Personal history of other specified conditions: Secondary | ICD-10-CM | POA: Insufficient documentation

## 2018-04-10 DIAGNOSIS — F1291 Cannabis use, unspecified, in remission: Secondary | ICD-10-CM | POA: Insufficient documentation

## 2018-04-10 LAB — NICOTINE SCREEN, URINE: COTININE UR QL SCN: POSITIVE ng/mL — AB

## 2018-04-10 LAB — MONITOR DRUG PROFILE 14(MW)
AMPHETAMINE SCREEN URINE: NEGATIVE ng/mL
BARBITURATE SCREEN URINE: NEGATIVE ng/mL
BENZODIAZEPINE SCREEN, URINE: NEGATIVE ng/mL
Buprenorphine, Urine: NEGATIVE ng/mL
COCAINE(METAB.)SCREEN, URINE: NEGATIVE ng/mL
Creatinine(Crt), U: 122.5 mg/dL (ref 20.0–300.0)
Fentanyl, Urine: NEGATIVE pg/mL
MEPERIDINE SCREEN, URINE: NEGATIVE ng/mL
Methadone Screen, Urine: NEGATIVE ng/mL
OPIATE SCREEN URINE: NEGATIVE ng/mL
OXYCODONE+OXYMORPHONE UR QL SCN: NEGATIVE ng/mL
PROPOXYPHENE SCREEN URINE: NEGATIVE ng/mL
Ph of Urine: 6 (ref 4.5–8.9)
Phencyclidine Qn, Ur: NEGATIVE ng/mL
SPECIFIC GRAVITY: 1.023
TRAMADOL SCREEN, URINE: NEGATIVE ng/mL

## 2018-04-10 LAB — CANNABINOID (GC/MS), URINE
CARBOXY THC UR: 131 ng/mL
Cannabinoid: POSITIVE — AB

## 2018-04-20 ENCOUNTER — Other Ambulatory Visit (HOSPITAL_COMMUNITY)
Admission: RE | Admit: 2018-04-20 | Discharge: 2018-04-20 | Disposition: A | Discharge status: Home / Self Care | Payer: Medicaid Other | Source: Ambulatory Visit | Attending: Certified Nurse Midwife | Admitting: Certified Nurse Midwife

## 2018-04-20 ENCOUNTER — Ambulatory Visit (INDEPENDENT_AMBULATORY_CARE_PROVIDER_SITE_OTHER): Payer: Medicaid Other | Admitting: Certified Nurse Midwife

## 2018-04-20 VITALS — BP 119/89 | HR 83 | Wt 139.0 lb

## 2018-04-20 DIAGNOSIS — Z3A11 11 weeks gestation of pregnancy: Secondary | ICD-10-CM

## 2018-04-20 DIAGNOSIS — Z01419 Encounter for gynecological examination (general) (routine) without abnormal findings: Secondary | ICD-10-CM | POA: Diagnosis not present

## 2018-04-20 DIAGNOSIS — F1291 Cannabis use, unspecified, in remission: Secondary | ICD-10-CM

## 2018-04-20 DIAGNOSIS — Z87898 Personal history of other specified conditions: Secondary | ICD-10-CM

## 2018-04-20 DIAGNOSIS — Z124 Encounter for screening for malignant neoplasm of cervix: Secondary | ICD-10-CM

## 2018-04-20 DIAGNOSIS — Z3401 Encounter for supervision of normal first pregnancy, first trimester: Secondary | ICD-10-CM | POA: Diagnosis not present

## 2018-04-20 LAB — POCT URINALYSIS DIPSTICK OB
Bilirubin, UA: NEGATIVE
Glucose, UA: NEGATIVE
Ketones, UA: NEGATIVE
LEUKOCYTES UA: NEGATIVE
Nitrite, UA: NEGATIVE
PH UA: 7 (ref 5.0–8.0)
PROTEIN: NEGATIVE
RBC UA: NEGATIVE
Spec Grav, UA: 1.01 (ref 1.010–1.025)
UROBILINOGEN UA: 0.2 U/dL

## 2018-04-20 NOTE — Patient Instructions (Signed)
Round Ligament Pain The round ligament is a cord of muscle and tissue that helps to support the uterus. It can become a source of pain during pregnancy if it becomes stretched or twisted as the baby grows. The pain usually begins in the second trimester of pregnancy, and it can come and go until the baby is delivered. It is not a serious problem, and it does not cause harm to the baby. Round ligament pain is usually a short, sharp, and pinching pain, but it can also be a dull, lingering, and aching pain. The pain is felt in the lower side of the abdomen or in the groin. It usually starts deep in the groin and moves up to the outside of the hip area. Pain can occur with:  A sudden change in position.  Rolling over in bed.  Coughing or sneezing.  Physical activity.  Follow these instructions at home: Watch your condition for any changes. Take these steps to help with your pain:  When the pain starts, relax. Then try: ? Sitting down. ? Flexing your knees up to your abdomen. ? Lying on your side with one pillow under your abdomen and another pillow between your legs. ? Sitting in a warm bath for 15-20 minutes or until the pain goes away.  Take over-the-counter and prescription medicines only as told by your health care provider.  Move slowly when you sit and stand.  Avoid long walks if they cause pain.  Stop or lessen your physical activities if they cause pain.  Contact a health care provider if:  Your pain does not go away with treatment.  You feel pain in your back that you did not have before.  Your medicine is not helping. Get help right away if:  You develop a fever or chills.  You develop uterine contractions.  You develop vaginal bleeding.  You develop nausea or vomiting.  You develop diarrhea.  You have pain when you urinate. This information is not intended to replace advice given to you by your health care provider. Make sure you discuss any questions you have  with your health care provider. Document Released: 04/26/2008 Document Revised: 12/24/2015 Document Reviewed: 09/24/2014 Elsevier Interactive Patient Education  2018 [place]. Back Pain in Pregnancy Back pain during pregnancy is common. Back pain may be caused by several factors that are related to changes during your pregnancy. Follow these instructions at home: Managing pain, stiffness, and swelling  If directed, apply ice for sudden (acute) back pain. ? Put ice in a plastic bag. ? Place a towel between your skin and the bag. ? Leave the ice on for 20 minutes, 2-3 times per day.  If directed, apply heat to the affected area before you exercise: ? Place a towel between your skin and the heat pack or heating pad. ? Leave the heat on for 20-30 minutes. ? Remove the heat if your skin turns bright red. This is especially important if you are unable to feel pain, heat, or cold. You may have a greater risk of getting burned. Activity  Exercise as told by your health care provider. Exercising is the best way to prevent or manage back pain.  Listen to your body when lifting. If lifting hurts, ask for help or bend your knees. This uses your leg muscles instead of your back muscles.  Squat down when picking up something from the floor. Do not bend over.  Only use bed rest as told by your health care provider. Bed  rest should only be used for the most severe episodes of back pain. Standing, Sitting, and Lying Down  Do not stand in one place for long periods of time.  Use good posture when sitting. Make sure your head rests over your shoulders and is not hanging forward. Use a pillow on your lower back if necessary.  Try sleeping on your side, preferably the left side, with a pillow or two between your legs. If you are sore after a night's rest, your bed may be too soft. A firm mattress may provide more support for your back during pregnancy. General instructions  Do not wear high  heels.  Eat a healthy diet. Try to gain weight within your health care provider's recommendations.  Use a maternity girdle, elastic sling, or back brace as told by your health care provider.  Take over-the-counter and prescription medicines only as told by your health care provider.  Keep all follow-up visits as told by your health care provider. This is important. This includes any visits with any specialists, such as a physical therapist. Contact a health care provider if:  Your back pain interferes with your daily activities.  You have increasing pain in other parts of your body. Get help right away if:  You develop numbness, tingling, weakness, or problems with the use of your arms or legs.  You develop severe back pain that is not controlled with medicine.  You have a sudden change in bowel or bladder control.  You develop shortness of breath, dizziness, or you faint.  You develop nausea, vomiting, or sweating.  You have back pain that is a rhythmic, cramping pain similar to labor pains. Labor pain is usually 1-2 minutes apart, lasts for about 1 minute, and involves a bearing down feeling or pressure in your pelvis.  You have back pain and your water breaks or you have vaginal bleeding.  You have back pain or numbness that travels down your leg.  Your back pain developed after you fell.  You develop pain on one side of your back.  You see blood in your urine.  You develop skin blisters in the area of your back pain. This information is not intended to replace advice given to you by your health care provider. Make sure you discuss any questions you have with your health care provider. Document Released: 10/26/2005 Document Revised: 12/24/2015 Document Reviewed: 04/01/2015 Elsevier Interactive Patient Education  2018 [place] for Pregnant Women While you are pregnant, your body will require additional nutrition to help support your growing baby. It is  recommended that you consume:  150 additional calories each day during your first trimester.  300 additional calories each day during your second trimester.  300 additional calories each day during your third trimester.  Eating a healthy, well-balanced diet is very important for your health and for your baby's health. You also have a higher need for some vitamins and minerals, such as folic acid, calcium, iron, and vitamin D. What do I need to know about eating during pregnancy?  Do not try to lose weight or go on a diet during pregnancy.  Choose healthy, nutritious foods. Choose  of a sandwich with a glass of milk instead of a candy bar or a high-calorie sugar-sweetened beverage.  Limit your overall intake of foods that have "empty calories." These are foods that have little nutritional value, such as sweets, desserts, candies, sugar-sweetened beverages, and fried foods.  Eat a variety of foods, especially fruits and  vegetables.  Take a prenatal vitamin to help meet the additional needs during pregnancy, specifically for folic acid, iron, calcium, and vitamin D.  Remember to stay active. Ask your health care provider for exercise recommendations that are specific to you.  Practice good food safety and cleanliness, such as washing your hands before you eat and after you prepare raw meat. This helps to prevent foodborne illnesses, such as listeriosis, that can be very dangerous for your baby. Ask your health care provider for more information about listeriosis. What does 150 extra calories look like? Healthy options for an additional 150 calories each day could be any of the following:  Plain low-fat yogurt (6-8 oz) with  cup of berries.  1 apple with 2 teaspoons of peanut butter.  Cut-up vegetables with  cup of hummus.  Low-fat chocolate milk (8 oz or 1 cup).  1 string cheese with 1 medium orange.   of a peanut butter and jelly sandwich on whole-wheat bread (1 tsp of peanut  butter).  For 300 calories, you could eat two of those healthy options each day. What is a healthy amount of weight to gain? The recommended amount of weight for you to gain is based on your pre-pregnancy BMI. If your pre-pregnancy BMI was:  Less than 18 (underweight), you should gain 28-40 lb.  18-24.9 (normal), you should gain 25-35 lb.  25-29.9 (overweight), you should gain 15-25 lb.  Greater than 30 (obese), you should gain 11-20 lb.  What if I am having twins or multiples? Generally, pregnant women who will be having twins or multiples may need to increase their daily calories by 300-600 calories each day. The recommended range for total weight gain is 25-54 lb, depending on your pre-pregnancy BMI. Talk with your health care provider for specific guidance about additional nutritional needs, weight gain, and exercise during your pregnancy. What foods can I eat? Grains Any grains. Try to choose whole grains, such as whole-wheat bread, oatmeal, or brown rice. Vegetables Any vegetables. Try to eat a variety of colors and types of vegetables to get a full range of vitamins and minerals. Remember to wash your vegetables well before eating. Fruits Any fruits. Try to eat a variety of colors and types of fruit to get a full range of vitamins and minerals. Remember to wash your fruits well before eating. Meats and Other Protein Sources Lean meats, including chicken, [place], fish, and lean cuts of beef, veal, or pork. Make sure that all meats are cooked to "well done." Tofu. Tempeh. Beans. Eggs. Peanut butter and other nut butters. Seafood, such as shrimp, crab, and lobster. If you choose fish, select types that are higher in omega-3 fatty acids, including salmon, herring, mussels, trout, sardines, and pollock. Make sure that all meats are cooked to food-safe temperatures. Dairy Pasteurized milk and milk alternatives. Pasteurized yogurt and pasteurized cheese. Cottage cheese. Sour  cream. Beverages Water. Juices that contain 100% fruit juice or vegetable juice. Caffeine-free teas and decaffeinated coffee. Drinks that contain caffeine are okay to drink, but it is better to avoid caffeine. Keep your total caffeine intake to less than 200 mg each day (12 oz of coffee, tea, or soda) or as directed by your health care provider. Condiments Any pasteurized condiments. Sweets and Desserts Any sweets and desserts. Fats and Oils Any fats and oils. The items listed above may not be a complete list of recommended foods or beverages. Contact your dietitian for more options. What foods are not recommended? Vegetables Unpasteurized (raw)  vegetable juices. Fruits Unpasteurized (raw) fruit juices. Meats and Other Protein Sources Cured meats that have nitrates, such as bacon, salami, and hotdogs. Luncheon meats, bologna, or other deli meats (unless they are reheated until they are steaming hot). Refrigerated pate, meat spreads from a meat counter, smoked seafood that is found in the refrigerated section of a store. Raw fish, such as sushi or sashimi. High mercury content fish, such as tilefish, shark, swordfish, and king mackerel. Raw meats, such as tuna or beef tartare. Undercooked meats and poultry. Make sure that all meats are cooked to food-safe temperatures. Dairy Unpasteurized (raw) milk and any foods that have raw milk in them. Soft cheeses, such as feta, queso blanco, queso fresco, Brie, Camembert cheeses, blue-veined cheeses, and Panela cheese (unless it is made with pasteurized milk, which must be stated on the label). Beverages Alcohol. Sugar-sweetened beverages, such as sodas, teas, or energy drinks. Condiments Homemade fermented foods and drinks, such as pickles, sauerkraut, or kombucha drinks. (Store-bought pasteurized versions of these are okay.) Other Salads that are made in the store, such as ham salad, chicken salad, egg salad, tuna salad, and seafood salad. The items  listed above may not be a complete list of foods and beverages to avoid. Contact your dietitian for more information. This information is not intended to replace advice given to you by your health care provider. Make sure you discuss any questions you have with your health care provider. Document Released: 05/02/2014 Document Revised: 12/24/2015 Document Reviewed: 12/31/2013 Elsevier Interactive Patient Education  2018 [place]. WHAT OB PATIENTS CAN EXPECT   Confirmation of pregnancy and ultrasound ordered if medically indicated-[redacted] weeks gestation  New OB (NOB) intake with nurse and New OB (NOB) labs- [redacted] weeks gestation  New OB (NOB) physical examination with provider- 11/[redacted] weeks gestation  Flu vaccine-[redacted] weeks gestation  Anatomy scan-[redacted] weeks gestation  Glucose tolerance test, blood work to test for anemia, T-dap vaccine-[redacted] weeks gestation  Vaginal swabs/cultures-STD/Group B strep-[redacted] weeks gestation  Appointments every 4 weeks until 28 weeks  Every 2 weeks from 28 weeks until 36 weeks  Weekly visits from 36 weeks until delivery  Second Trimester of Pregnancy The second trimester is from week 13 through week 28, month 4 through 6. This is often the time in pregnancy that you feel your best. Often times, morning sickness has lessened or quit. You may have more energy, and you may get hungry more often. Your unborn baby (fetus) is growing rapidly. At the end of the sixth month, he or she is about 9 inches long and weighs about 1 pounds. You will likely feel the baby move (quickening) between 18 and 20 weeks of pregnancy. Follow these instructions at home:  Avoid all smoking, herbs, and alcohol. Avoid drugs not approved by your doctor.  Do not use any tobacco products, including cigarettes, chewing tobacco, and electronic cigarettes. If you need help quitting, ask your doctor. You may get counseling or other support to help you quit.  Only take medicine as told by your doctor.  Some medicines are safe and some are not during pregnancy.  Exercise only as told by your doctor. Stop exercising if you start having cramps.  Eat regular, healthy meals.  Wear a good support bra if your breasts are tender.  Do not use hot tubs, steam rooms, or saunas.  Wear your seat belt when driving.  Avoid raw meat, uncooked cheese, and liter boxes and soil used by cats.  Take your prenatal vitamins.  Take  1500-2000 milligrams of calcium daily starting at the 20th week of pregnancy until you deliver your baby.  Try taking medicine that helps you poop (stool softener) as needed, and if your doctor approves. Eat more fiber by eating fresh fruit, vegetables, and whole grains. Drink enough fluids to keep your pee (urine) clear or pale yellow.  Take warm water baths (sitz baths) to soothe pain or discomfort caused by hemorrhoids. Use hemorrhoid cream if your doctor approves.  If you have puffy, bulging veins (varicose veins), wear support hose. Raise (elevate) your feet for 15 minutes, 3-4 times a day. Limit salt in your diet.  Avoid heavy lifting, wear low heals, and sit up straight.  Rest with your legs raised if you have leg cramps or low back pain.  Visit your dentist if you have not gone during your pregnancy. Use a soft toothbrush to brush your teeth. Be gentle when you floss.  You can have sex (intercourse) unless your doctor tells you not to.  Go to your doctor visits. Get help if:  You feel dizzy.  You have mild cramps or pressure in your lower belly (abdomen).  You have a nagging pain in your belly area.  You continue to feel sick to your stomach (nauseous), throw up (vomit), or have watery poop (diarrhea).  You have bad smelling fluid coming from your vagina.  You have pain with peeing (urination). Get help right away if:  You have a fever.  You are leaking fluid from your vagina.  You have spotting or bleeding from your vagina.  You have severe belly  cramping or pain.  You lose or gain weight rapidly.  You have trouble catching your breath and have chest pain.  You notice sudden or extreme puffiness (swelling) of your face, hands, ankles, feet, or legs.  You have not felt the baby move in over an hour.  You have severe headaches that do not go away with medicine.  You have vision changes. This information is not intended to replace advice given to you by your health care provider. Make sure you discuss any questions you have with your health care provider. Document Released: 10/12/2009 Document Revised: 12/24/2015 Document Reviewed: 09/18/2012 Elsevier Interactive Patient Education  2017 [place]. Common Medications Safe in Pregnancy  Acne:      Constipation:  Benzoyl Peroxide     Colace  Clindamycin      Dulcolax Suppository  Topica Erythromycin     Fibercon  Salicylic Acid      Metamucil         Miralax AVOID:        Senakot   Accutane    Cough:  Retin-A       Cough Drops  Tetracycline      Phenergan w/ Codeine if Rx  Minocycline      Robitussin (Plain & DM)  Antibiotics:     Crabs/Lice:  Ceclor       RID  Cephalosporins    AVOID:  E-Mycins      Kwell  Keflex  Macrobid/Macrodantin   Diarrhea:  Penicillin      Kao-Pectate  Zithromax      Imodium AD         PUSH FLUIDS AVOID:       Cipro     Fever:  Tetracycline      Tylenol (Regular or Extra  Minocycline       Strength)  Levaquin      Extra Strength-Do not  Exceed 8 tabs/24 hrs Caffeine:        <271m/day (equiv. To 1 cup of coffee or  approx. 3 12 oz sodas)         Gas: Cold/Hayfever:       Gas-X  Benadryl      Mylicon  Claritin       Phazyme  **Claritin-D        Chlor-Trimeton    Headaches:  Dimetapp      ASA-Free Excedrin  Drixoral-Non-Drowsy     Cold Compress  Mucinex (Guaifenasin)     Tylenol (Regular or Extra  Sudafed/Sudafed-12 Hour     Strength)  **Sudafed PE Pseudoephedrine   Tylenol Cold & Sinus     Vicks Vapor  Rub  Zyrtec  **AVOID if Problems With Blood Pressure         Heartburn: Avoid lying down for at least 1 hour after meals  Aciphex      Maalox     Rash:  Milk of Magnesia     Benadryl    Mylanta       1% Hydrocortisone Cream  Pepcid  Pepcid Complete   Sleep Aids:  Prevacid      Ambien   Prilosec       Benadryl  Rolaids       Chamomile Tea  Tums (Limit 4/day)     Unisom  Zantac       Tylenol PM         Warm milk-add vanilla or  Hemorrhoids:       Sugar for taste  Anusol/Anusol H.C.  (RX: Analapram 2.5%)  Sugar Substitutes:  Hydrocortisone OTC     Ok in moderation  Preparation H      Tucks        Vaseline lotion applied to tissue with wiping    Herpes:     Throat:  Acyclovir      Oragel  Famvir  Valtrex     Vaccines:         Flu Shot Leg Cramps:       *Gardasil  Benadryl      Hepatitis A         Hepatitis B Nasal Spray:       Pneumovax  Saline Nasal Spray     Polio Booster         Tetanus Nausea:       Tuberculosis test or PPD  Vitamin B6 25 mg TID   AVOID:    Dramamine      *Gardasil  Emetrol       Live Poliovirus  Ginger Root 250 mg QID    MMR (measles, mumps &  High Complex Carbs @ Bedtime    rebella)  Sea Bands-Accupressure    Varicella (Chickenpox)  Unisom 1/2 tab TID     *No known complications           If received before Pain:         Known pregnancy;   Darvocet       Resume series after  Lortab        Delivery  Percocet    Yeast:   Tramadol      Femstat  Tylenol 3      Gyne-lotrimin  Ultram       Monistat  Vicodin           MISC:         All Sunscreens  Hair Coloring/highlights          Insect Repellant's          (Including DEET)         Mystic Tans  

## 2018-04-20 NOTE — Progress Notes (Signed)
Pt presents today for NOB PE. She is feeling well and has no concerns. Pt is requesting Genetic Test results.

## 2018-04-23 ENCOUNTER — Telehealth: Payer: Self-pay

## 2018-04-23 ENCOUNTER — Encounter: Payer: Self-pay | Admitting: Certified Nurse Midwife

## 2018-04-23 ENCOUNTER — Telehealth: Payer: Self-pay | Admitting: Certified Nurse Midwife

## 2018-04-23 NOTE — Telephone Encounter (Signed)
Called pt with results.

## 2018-04-23 NOTE — Telephone Encounter (Signed)
The patient is asking for an update about her gender on her Tamara Bishop order.  Tamara Bishop got the form to fill out and have 24 - 48 hours to get the gender and she states this was on Friday and she states the [employer] folks were really rude to her, as well.  While in this encounter, the [employer] rep called and Tamara Bishop transferred the call to Tamara Bishop.  The patient is just asking for an update as to what is happening and when she can get the results?  Please advise, thanks.

## 2018-04-23 NOTE — Telephone Encounter (Signed)
[facility] results received and pt was informed by [facility].

## 2018-04-26 NOTE — Progress Notes (Signed)
NEW OB HISTORY AND PHYSICAL  SUBJECTIVE:       Tamara Bishop is a 22 y.o. [id number] female, Patient's last menstrual period was 01/22/2018 (approximate)., Estimated Date of Delivery: 11/06/18, [redacted]w[redacted]d, presents today for establishment of Prenatal Care.  She has no unusual complaints. Endorses fatigue.   Denies difficulty breathing or respiratory distress, chest pain, abdominal pain, vaginal bleeding, dysuria, and leg pain or swelling.   Accompanied by significant other. Desires midwifery care.    Gynecologic History  Patient's last menstrual period was 01/22/2018 (approximate).   Contraception: none  Last Pap: due.   Obstetric History  OB History  Gravida Para Term Preterm AB Living  1 0 0 0 0 0  SAB TAB Ectopic Multiple Live Births  0 0 0 0      # Outcome Date GA Lbr Len/2nd Weight Sex Delivery Anes PTL Lv  1 Current             Past Medical History:  Diagnosis Date  . Adjustment disorder with anxiety   . Allergic rhinitis   . Anxiety   . Cigarette smoker   . Depression   . Insomnia   . Tobacco abuse     Past Surgical History:  Procedure Laterality Date  . WISDOM TOOTH EXTRACTION      Current Outpatient Medications on File Prior to Visit  Medication Sig Dispense Refill  . Doxylamine-Pyridoxine (DICLEGIS) 10-10 MG TBEC Take 2 tablets by mouth at bedtime. If symptoms persist, add one tablet in the morning and one in the afternoon 100 tablet 5  . Prenatal Vit-Fe Fumarate-FA (PRENATAL MULTIVITAMIN) TABS tablet Take 1 tablet by mouth daily at 12 noon.    . promethazine (PHENERGAN) 25 MG tablet Take 0.5 tablets (12.5 mg total) by mouth every 6 (six) hours as needed for nausea or vomiting. (Patient not taking: Reported on 04/20/2018) 30 tablet 1   No current facility-administered medications on file prior to visit.     Allergies  Allergen Reactions  . Azithromycin Other (See Comments)    Felt like her stomach was going to come out of her body    Social History    Socioeconomic History  . Marital status: Single    Spouse name: Not on file  . Number of children: Not on file  . Years of education: Not on file  . Highest education level: Not on file  Occupational History  . Not on file  Social Needs  . Financial resource strain: Not on file  . Food insecurity:    Worry: Not on file    Inability: Not on file  . Transportation needs:    Medical: Not on file    Non-medical: Not on file  Tobacco Use  . Smoking status: Current Every Day Smoker    Packs/day: 0.50    Years: 1.00    Pack years: 0.50    Types: Cigarettes  . Smokeless tobacco: Never Used  Substance and Sexual Activity  . Alcohol use: No  . Drug use: No  . Sexual activity: Yes    Birth control/protection: Injection  Lifestyle  . Physical activity:    Days per week: Not on file    Minutes per session: Not on file  . Stress: Not on file  Relationships  . Social connections:    Talks on phone: Not on file    Gets together: Not on file    Attends religious service: Not on file    Active member of club or organization:  Not on file    Attends meetings of clubs or organizations: Not on file    Relationship status: Not on file  . Intimate partner violence:    Fear of current or ex partner: Not on file    Emotionally abused: Not on file    Physically abused: Not on file    Forced sexual activity: Not on file  Other Topics Concern  . Not on file  Social History Narrative  . Not on file    Family History  Problem Relation Age of Onset  . Lupus Mother   . Diabetes Father   . Breast cancer Maternal Grandmother   . Cancer Maternal Grandmother         breast  . Diabetes Paternal Grandmother   . Diabetes Paternal Grandfather   . Lupus Sister   . Heart disease Maternal Grandfather     The following portions of the patient's history were reviewed and updated as appropriate: allergies, current medications, past OB history, past medical history, past surgical history, past  family history, past social history, and problem list.    OBJECTIVE:  BP 119/89   Pulse 83   Wt 139 lb (63 kg)   LMP 01/22/2018 (Approximate)   BMI 25.84 kg/m   Initial Physical Exam (New OB)  GENERAL APPEARANCE: alert, well appearing, in no apparent distress  HEAD: normocephalic, atraumatic  MOUTH: mucous membranes moist, pharynx normal without lesions  THYROID: no thyromegaly or masses present  BREASTS: no masses noted, no significant tenderness, no palpable axillary nodes, no skin changes  LUNGS: clear to auscultation, no wheezes, rales or rhonchi, symmetric air entry  HEART: regular rate and rhythm, no murmurs  ABDOMEN: soft, nontender, nondistended, no abnormal masses, no epigastric pain and FHT present  EXTREMITIES: no redness or tenderness in the calves or thighs, no edema  SKIN: normal coloration and turgor, no rashes, professional tattoos present  LYMPH NODES: no adenopathy palpable  NEUROLOGIC: alert, oriented, normal speech, no focal findings or movement disorder noted  PELVIC EXAM EXTERNAL GENITALIA: normal appearing vulva with no masses, tenderness or lesions VAGINA: no abnormal discharge or lesions CERVIX: no lesions or cervical motion tenderness UTERUS: gravid ADNEXA: no masses palpable and nontender OB EXAM PELVIMETRY: appears adequate  ASSESSMENT: Normal pregnancy History of marijuana use Low risk female  PLAN: Prenatal care New OB counseling: The patient has been given an overview regarding routine prenatal care. Recommendations regarding diet, weight gain, and exercise in pregnancy were given. Prenatal testing, optional genetic testing, and ultrasound use in pregnancy were reviewed.  Benefits of Breast Feeding were discussed. The patient is encouraged to consider nursing her baby post partum. See orders   Gunnar Bulla, CNM [facility], [facility]

## 2018-05-01 ENCOUNTER — Encounter: Payer: Self-pay | Admitting: Certified Nurse Midwife

## 2018-05-01 DIAGNOSIS — R87612 Low grade squamous intraepithelial lesion on cytologic smear of cervix (LGSIL): Secondary | ICD-10-CM | POA: Insufficient documentation

## 2018-05-01 LAB — CYTOLOGY - PAP
CANDIDA VAGINITIS: POSITIVE — AB
HPV (WINDOPATH): DETECTED — AB

## 2018-05-10 ENCOUNTER — Encounter: Payer: Self-pay | Admitting: Certified Nurse Midwife

## 2018-05-14 ENCOUNTER — Emergency Department
Admission: EM | Admit: 2018-05-14 | Discharge: 2018-05-14 | Disposition: A | Discharge status: Home / Self Care | Payer: Medicaid Other | Attending: Emergency Medicine | Admitting: Emergency Medicine

## 2018-05-14 ENCOUNTER — Encounter: Payer: Self-pay | Admitting: Emergency Medicine

## 2018-05-14 ENCOUNTER — Other Ambulatory Visit: Payer: Self-pay

## 2018-05-14 DIAGNOSIS — Z3491 Encounter for supervision of normal pregnancy, unspecified, first trimester: Secondary | ICD-10-CM

## 2018-05-14 DIAGNOSIS — O99331 Smoking (tobacco) complicating pregnancy, first trimester: Secondary | ICD-10-CM | POA: Diagnosis not present

## 2018-05-14 DIAGNOSIS — Z79899 Other long term (current) drug therapy: Secondary | ICD-10-CM | POA: Diagnosis not present

## 2018-05-14 DIAGNOSIS — O2342 Unspecified infection of urinary tract in pregnancy, second trimester: Secondary | ICD-10-CM | POA: Diagnosis not present

## 2018-05-14 DIAGNOSIS — O2341 Unspecified infection of urinary tract in pregnancy, first trimester: Secondary | ICD-10-CM | POA: Insufficient documentation

## 2018-05-14 DIAGNOSIS — Z041 Encounter for examination and observation following transport accident: Secondary | ICD-10-CM | POA: Diagnosis present

## 2018-05-14 DIAGNOSIS — F17210 Nicotine dependence, cigarettes, uncomplicated: Secondary | ICD-10-CM | POA: Insufficient documentation

## 2018-05-14 DIAGNOSIS — Z3A15 15 weeks gestation of pregnancy: Secondary | ICD-10-CM | POA: Insufficient documentation

## 2018-05-14 DIAGNOSIS — N390 Urinary tract infection, site not specified: Secondary | ICD-10-CM

## 2018-05-14 LAB — URINALYSIS, COMPLETE (UACMP) WITH MICROSCOPIC
BILIRUBIN URINE: NEGATIVE
Glucose, UA: NEGATIVE mg/dL
HGB URINE DIPSTICK: NEGATIVE
KETONES UR: NEGATIVE mg/dL
NITRITE: NEGATIVE
PROTEIN: 100 mg/dL — AB
Specific Gravity, Urine: 1.017 (ref 1.005–1.030)
pH: 7 (ref 5.0–8.0)

## 2018-05-14 MED ORDER — CEPHALEXIN 500 MG PO CAPS: 500.0000 mg | ORAL_CAPSULE | Freq: Three times a day (TID) | ORAL | 0 refills | Status: DC
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
## 2018-05-14 NOTE — ED Notes (Signed)
Fetal heart tones- 136 bpm in left lower abdomen
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
## 2018-05-14 NOTE — ED Notes (Signed)
Fetal heart tones- 141 bpm in middle lower abdomen. Provider made aware.

## 2018-05-16 LAB — URINE CULTURE
CULTURE: NO GROWTH
SPECIAL REQUESTS: NORMAL

## 2018-05-21 ENCOUNTER — Encounter: Payer: Self-pay | Admitting: Certified Nurse Midwife

## 2018-05-21 ENCOUNTER — Ambulatory Visit (INDEPENDENT_AMBULATORY_CARE_PROVIDER_SITE_OTHER): Payer: Medicaid Other | Admitting: Certified Nurse Midwife

## 2018-05-21 VITALS — BP 109/71 | HR 88 | Wt 142.3 lb

## 2018-05-21 DIAGNOSIS — Z23 Encounter for immunization: Secondary | ICD-10-CM | POA: Diagnosis not present

## 2018-05-21 DIAGNOSIS — Z3401 Encounter for supervision of normal first pregnancy, first trimester: Secondary | ICD-10-CM

## 2018-05-21 LAB — POCT URINALYSIS DIPSTICK OB
Bilirubin, UA: NEGATIVE
Blood, UA: NEGATIVE
Glucose, UA: NEGATIVE
KETONES UA: NEGATIVE
Leukocytes, UA: NEGATIVE
NITRITE UA: NEGATIVE
SPEC GRAV UA: 1.01 (ref 1.010–1.025)
Urobilinogen, UA: 0.2 E.U./dL
pH, UA: 6.5 (ref 5.0–8.0)

## 2018-05-21 LAB — POCT URINALYSIS DIPSTICK
Bilirubin, UA: NEGATIVE
Blood, UA: NEGATIVE
GLUCOSE UA: NEGATIVE
KETONES UA: NEGATIVE
LEUKOCYTES UA: NEGATIVE
NITRITE UA: NEGATIVE
Protein, UA: POSITIVE — AB
SPEC GRAV UA: 1.01 (ref 1.010–1.025)
UROBILINOGEN UA: 0.2 U/dL
pH, UA: 6.5 (ref 5.0–8.0)

## 2018-05-21 NOTE — Patient Instructions (Signed)

## 2018-05-21 NOTE — Progress Notes (Signed)
ROB, doing well. Pt states that she is no longer smoking cigarettes or MJ. Praise given. Pt state she has a history of anxiety in the past. Pt encouraged to reach out during pregnancy if she is becoming symptomatic. She verbalizes and agrees. Discussed Anatomy u/s at next visit. Follow up 4 wks. With Melody.  [name], CNM

## 2018-06-18 ENCOUNTER — Other Ambulatory Visit: Payer: Self-pay

## 2018-06-18 ENCOUNTER — Encounter: Payer: Self-pay | Admitting: *Deleted

## 2018-06-18 DIAGNOSIS — O209 Hemorrhage in early pregnancy, unspecified: Secondary | ICD-10-CM | POA: Diagnosis not present

## 2018-06-18 DIAGNOSIS — Z5321 Procedure and treatment not carried out due to patient leaving prior to being seen by health care provider: Secondary | ICD-10-CM | POA: Insufficient documentation

## 2018-06-18 DIAGNOSIS — Z3A20 20 weeks gestation of pregnancy: Secondary | ICD-10-CM | POA: Insufficient documentation

## 2018-06-18 LAB — CBC WITH DIFFERENTIAL/PLATELET
Abs Immature Granulocytes: 0.2 10*3/uL — ABNORMAL HIGH (ref 0.00–0.07)
BASOS ABS: 0.1 10*3/uL (ref 0.0–0.1)
Basophils Relative: 0 %
EOS ABS: 0.2 10*3/uL (ref 0.0–0.5)
Eosinophils Relative: 1 %
HEMATOCRIT: 31.9 % — AB (ref 36.0–46.0)
HEMOGLOBIN: 10.8 g/dL — AB (ref 12.0–15.0)
IMMATURE GRANULOCYTES: 1 %
LYMPHS ABS: 2.3 10*3/uL (ref 0.7–4.0)
LYMPHS PCT: 14 %
MCH: 30.3 pg (ref 26.0–34.0)
MCHC: 33.9 g/dL (ref 30.0–36.0)
MCV: 89.4 fL (ref 80.0–100.0)
Monocytes Absolute: 0.9 10*3/uL (ref 0.1–1.0)
Monocytes Relative: 6 %
NEUTROS PCT: 78 %
NRBC: 0 % (ref 0.0–0.2)
Neutro Abs: 12.3 10*3/uL — ABNORMAL HIGH (ref 1.7–7.7)
Platelets: 182 10*3/uL (ref 150–400)
RBC: 3.57 MIL/uL — AB (ref 3.87–5.11)
RDW: 13.3 % (ref 11.5–15.5)
WBC: 15.9 10*3/uL — AB (ref 4.0–10.5)

## 2018-06-18 LAB — BASIC METABOLIC PANEL
Anion gap: 9 (ref 5–15)
BUN: 8 mg/dL (ref 6–20)
CHLORIDE: 105 mmol/L (ref 98–111)
CO2: 24 mmol/L (ref 22–32)
CREATININE: 0.55 mg/dL (ref 0.44–1.00)
Calcium: 8.7 mg/dL — ABNORMAL LOW (ref 8.9–10.3)
GFR calc Af Amer: >60 mL/min (ref >60)
GFR calc non Af Amer: >60 mL/min (ref >60)
Glucose, Bld: 103 mg/dL — ABNORMAL HIGH (ref 70–99)
Potassium: 3.4 mmol/L — ABNORMAL LOW (ref 3.5–5.1)
Sodium: 138 mmol/L (ref 135–145)

## 2018-06-18 LAB — HCG, QUANTITATIVE, PREGNANCY: hCG, Beta Chain, Quant, S: 11852 m[IU]/mL — ABNORMAL HIGH (ref <5)

## 2018-06-18 NOTE — ED Triage Notes (Signed)
Pt reports vaginal spotting/bleeding that began today.  Pt also has abd cramping on left side of abdomen.  Pt is approx [redacted] weeks pregnant.

## 2018-06-18 NOTE — ED Notes (Signed)
[facility] st unable to accept pt since she is not 20wks

## 2018-06-19 ENCOUNTER — Emergency Department
Admission: EM | Admit: 2018-06-19 | Discharge: 2018-06-19 | Disposition: A | Discharge status: Home / Self Care | Payer: Medicaid Other | Attending: Emergency Medicine | Admitting: Emergency Medicine

## 2018-06-19 ENCOUNTER — Other Ambulatory Visit: Payer: Medicaid Other

## 2018-06-19 ENCOUNTER — Emergency Department: Payer: Medicaid Other

## 2018-06-19 DIAGNOSIS — O4690 Antepartum hemorrhage, unspecified, unspecified trimester: Secondary | ICD-10-CM

## 2018-06-19 NOTE — ED Notes (Signed)
No answer when called several times from lobby 

## 2018-06-19 NOTE — ED Notes (Addendum)
No answer when called several times from lobby 

## 2018-06-20 ENCOUNTER — Ambulatory Visit (INDEPENDENT_AMBULATORY_CARE_PROVIDER_SITE_OTHER): Payer: Medicaid Other | Admitting: Obstetrics and Gynecology

## 2018-06-20 ENCOUNTER — Ambulatory Visit (INDEPENDENT_AMBULATORY_CARE_PROVIDER_SITE_OTHER): Payer: Medicaid Other

## 2018-06-20 ENCOUNTER — Telehealth: Payer: Self-pay | Admitting: Emergency Medicine

## 2018-06-20 VITALS — BP 82/65 | HR 85 | Wt 152.9 lb

## 2018-06-20 DIAGNOSIS — Z3401 Encounter for supervision of normal first pregnancy, first trimester: Secondary | ICD-10-CM

## 2018-06-20 DIAGNOSIS — Z3492 Encounter for supervision of normal pregnancy, unspecified, second trimester: Secondary | ICD-10-CM

## 2018-06-20 LAB — POCT URINALYSIS DIPSTICK OB
BILIRUBIN UA: NEGATIVE
GLUCOSE, UA: NEGATIVE
Ketones, UA: NEGATIVE
Nitrite, UA: NEGATIVE
POC,PROTEIN,UA: NEGATIVE
RBC UA: NEGATIVE
Spec Grav, UA: 1.01 (ref 1.010–1.025)
Urobilinogen, UA: 0.2 E.U./dL
pH, UA: 7 (ref 5.0–8.0)

## 2018-06-20 NOTE — Progress Notes (Signed)
ROB- anatomy scan today reveals the following: Indications:Anatomy Ultrasound Findings:  Singleton intrauterine pregnancy is visualized with FHR at 149 BPM. Biometrics give an (U/S) Gestational age of 2013w3d and an (U/S) EDD of 11/04/18; this correlates with the clinically established Estimated Date of Delivery: 11/06/18  Fetal presentation is transverse. Fetal head to maternal RT EFW: wnl. Placenta: anterior. Grade: 0. Plac to Cevical IO = 1.4 cm (low lying) , but there is a LUS contraction AFI: subjectively normal.  Anatomic survey is incomplete for 4CH,RVOT,Nose/lips,profile, d/t fetal position and normal; Gender - female.    Right Ovary is normal in appearance. Left Ovary is normal appearance. Survey of the adnexa demonstrates no adnexal masses. There is no free peritoneal fluid in the cul de sac.  Impression: 1. 655w1d Viable Singleton Intrauterine pregnancy by U/S. 2. (U/S) EDD is consistent with Clinically established Estimated Date of Delivery: 11/06/18 . 3. Normal Anatomy Scan,incomplete for 4CH,RVOT,Nose/lips,profile, d/t fetal position 4.  Plac to Cevical IO = 1.4 cm (low lying) , but there is a LUS contraction   Counseled at length and will repeat u/s in 2-4 weeks to complete anatomy scan.

## 2018-06-20 NOTE — Progress Notes (Signed)
ROB- anatomy scan done today, pt is doing well 

## 2018-06-20 NOTE — Telephone Encounter (Signed)
Called patient due to lwot to inquire about condition and follow up plans. Patient says she is at her doctor now.

## 2018-06-25 ENCOUNTER — Encounter: Payer: Self-pay | Admitting: *Deleted

## 2018-06-25 ENCOUNTER — Encounter: Payer: Self-pay | Admitting: Certified Nurse Midwife

## 2018-07-03 ENCOUNTER — Other Ambulatory Visit: Payer: Self-pay | Admitting: Obstetrics and Gynecology

## 2018-07-03 DIAGNOSIS — Z3492 Encounter for supervision of normal pregnancy, unspecified, second trimester: Secondary | ICD-10-CM

## 2018-07-04 ENCOUNTER — Ambulatory Visit (INDEPENDENT_AMBULATORY_CARE_PROVIDER_SITE_OTHER): Payer: Medicaid Other

## 2018-07-04 DIAGNOSIS — Z3492 Encounter for supervision of normal pregnancy, unspecified, second trimester: Secondary | ICD-10-CM | POA: Diagnosis not present

## 2018-07-20 ENCOUNTER — Ambulatory Visit (INDEPENDENT_AMBULATORY_CARE_PROVIDER_SITE_OTHER): Payer: Medicaid Other | Admitting: Certified Nurse Midwife

## 2018-07-20 VITALS — BP 119/65 | HR 71 | Wt 161.2 lb

## 2018-07-20 DIAGNOSIS — Z3492 Encounter for supervision of normal pregnancy, unspecified, second trimester: Secondary | ICD-10-CM | POA: Diagnosis not present

## 2018-07-20 LAB — POCT URINALYSIS DIPSTICK OB
BILIRUBIN UA: NEGATIVE
Glucose, UA: NEGATIVE
KETONES UA: NEGATIVE
Nitrite, UA: NEGATIVE
PH UA: 7 (ref 5.0–8.0)
PROTEIN: NEGATIVE
RBC UA: NEGATIVE
SPEC GRAV UA: 1.02 (ref 1.010–1.025)
UROBILINOGEN UA: 0.2 U/dL

## 2018-07-20 NOTE — Progress Notes (Signed)
ROB. Patient c/o constant lower back pain "for a while", started using belly band yesterday with some relief.

## 2018-07-20 NOTE — Patient Instructions (Addendum)
Pain Relief During Labor and Delivery Many things can cause pain during labor and delivery, including:  Pressure on bones and ligaments due to the baby moving through the pelvis.  Stretching of tissues due to the baby moving through the birth canal.  Muscle tension due to anxiety or nervousness.  The uterus tightening (contracting) and relaxing to help move the baby. There are many ways to deal with the pain of labor and delivery. They include:  Taking prenatal classes. Taking these classes helps you know what to expect during your baby's birth. What you learn will increase your confidence and decrease your anxiety.  Practicing relaxation techniques or doing relaxing activities, such as: ? Focused breathing. ? Meditation. ? Visualization. ? Aroma therapy. ? Listening to your favorite music. ? Hypnosis.  Taking a warm shower or bath (hydrotherapy). This may: ? Provide comfort and relaxation. ? Lessen your perception of pain. ? Decrease the amount of pain medicine needed. ? Decrease the length of labor.  Getting a massage or counterpressure on your back.  Applying warm packs or ice packs.  Changing positions often, moving around, or using a birthing ball.  Getting: ? Pain medicine through an IV or injection into a muscle. ? Pain medicine inserted into your spinal column. ? Injections of sterile water just under the skin on your lower back (intradermal injections). ? Laughing gas (nitrous oxide). Discuss your pain control options with your health care provider during your prenatal visits. Explore the options offered by your hospital or birth center. What kinds of medicine are available? There are two kinds of medicines that can be used to relieve pain during labor and delivery:  Analgesics. These medicines decrease pain without causing you to lose feeling or the ability to move your muscles.  Anesthetics. These medicines block feeling in the body and can decrease your  ability to move freely. Both of these kinds of medicine can cause minor side effects, such as nausea, trouble concentrating, and sleepiness. They can also decrease the baby's heart rate before birth and affect the baby's breathing rate after birth. For this reason, health care providers are careful about when and how much medicine is given. What are specific medicines and procedures that provide pain relief? Local Anesthetics Local anesthetics are used to numb a small area of the body. They may be used along with another kind of anesthetic or used to numb the nerves of the vagina, cervix, and perineum during the second stage of labor. General Anesthetics General anesthetics cause you to lose consciousness so you do not feel pain. They are usually only used for an emergency cesarean delivery. General anesthetics are given through an IV tube and a mask. Pudendal Block A pudendal block is a form of local anesthetic. It may be used to relieve the pain associated with pushing or stretching of the perineum at the time of delivery or to further numb the perineum. A pudendal block is done by injecting numbing medicine through the vaginal wall into a nerve in the pelvis. Epidural Analgesia Epidural analgesia is given through a flexible IV catheter that is inserted into the lower back. Numbing medicine is delivered continuously to the area near your spinal column nerves (epidural space). After having this type of analgesia, you may be able to move your legs but you most likely will not be able to walk. Depending on the amount of medicine given, you may lose all feeling in the lower half of your body, or you may retain some level   of sensation, including the urge to push. Epidural analgesia can be used to provide pain relief for a vaginal birth. Spinal Block A spinal block is similar to epidural analgesia, but the medicine is injected into the spinal fluid instead of the epidural space. A spinal block is only given  once. It starts to relieve pain quickly, but the pain relief lasts only 1-6 hours. Spinal blocks can be used for cesarean deliveries. Combined Spinal-Epidural (CSE) Block A CSE block combines the effects of a spinal block and epidural analgesia. The spinal block works quickly to block all pain. The epidural analgesia provides continuous pain relief, even after the effects of the spinal block have worn off. This information is not intended to replace advice given to you by your health care provider. Make sure you discuss any questions you have with your health care provider. Document Released: 11/03/2008 Document Revised: 12/25/2015 Document Reviewed: 12/09/2015 Elsevier Interactive Patient Education  2019 [employer]. Breastfeeding  Choosing to breastfeed is one of the best decisions you can make for yourself and your baby. A change in hormones during pregnancy causes your breasts to make breast milk in your milk-producing glands. Hormones prevent breast milk from being released before your baby is born. They also prompt milk flow after birth. Once breastfeeding has begun, thoughts of your baby, as well as his or her sucking or crying, can stimulate the release of milk from your milk-producing glands. Benefits of breastfeeding Research shows that breastfeeding offers many health benefits for infants and mothers. It also offers a cost-free and convenient way to feed your baby. For your baby  Your first milk (colostrum) helps your baby's digestive system to function better.  Special cells in your milk (antibodies) help your baby to fight off infections.  Breastfed babies are less likely to develop asthma, allergies, obesity, or type 2 diabetes. They are also at lower risk for sudden infant death syndrome (SIDS).  Nutrients in breast milk are better able to meet your baby's needs compared to infant formula.  Breast milk improves your baby's brain development. For you  Breastfeeding helps to  create a very special bond between you and your baby.  Breastfeeding is convenient. Breast milk costs nothing and is always available at the correct temperature.  Breastfeeding helps to burn calories. It helps you to lose the weight that you gained during pregnancy.  Breastfeeding makes your uterus return faster to its size before pregnancy. It also slows bleeding (lochia) after you give birth.  Breastfeeding helps to lower your risk of developing type 2 diabetes, osteoporosis, rheumatoid arthritis, cardiovascular disease, and breast, ovarian, uterine, and endometrial cancer later in life. Breastfeeding basics Starting breastfeeding  Find a comfortable place to sit or lie down, with your neck and back well-supported.  Place a pillow or a rolled-up blanket under your baby to bring him or her to the level of your breast (if you are seated). Nursing pillows are specially designed to help support your arms and your baby while you breastfeed.  Make sure that your baby's tummy (abdomen) is facing your abdomen.  Gently massage your breast. With your fingertips, massage from the outer edges of your breast inward toward the nipple. This encourages milk flow. If your milk flows slowly, you may need to continue this action during the feeding.  Support your breast with 4 fingers underneath and your thumb above your nipple (make the letter "C" with your hand). Make sure your fingers are well away from your nipple and your  baby's mouth.  Stroke your baby's lips gently with your finger or nipple.  When your baby's mouth is open wide enough, quickly bring your baby to your breast, placing your entire nipple and as much of the areola as possible into your baby's mouth. The areola is the colored area around your nipple. ? More areola should be visible above your baby's upper lip than below the lower lip. ? Your baby's lips should be opened and extended outward (flanged) to ensure an adequate, comfortable  latch. ? Your baby's tongue should be between his or her lower gum and your breast.  Make sure that your baby's mouth is correctly positioned around your nipple (latched). Your baby's lips should create a seal on your breast and be turned out (everted).  It is common for your baby to suck about 2-3 minutes in order to start the flow of breast milk. Latching Teaching your baby how to latch onto your breast properly is very important. An improper latch can cause nipple pain, decreased milk supply, and poor weight gain in your baby. Also, if your baby is not latched onto your nipple properly, he or she may swallow some air during feeding. This can make your baby fussy. Burping your baby when you switch breasts during the feeding can help to get rid of the air. However, teaching your baby to latch on properly is still the best way to prevent fussiness from swallowing air while breastfeeding. Signs that your baby has successfully latched onto your nipple  Silent tugging or silent sucking, without causing you pain. Infant's lips should be extended outward (flanged).  Swallowing heard between every 3-4 sucks once your milk has started to flow (after your let-down milk reflex occurs).  Muscle movement above and in front of his or her ears while sucking. Signs that your baby has not successfully latched onto your nipple  Sucking sounds or smacking sounds from your baby while breastfeeding.  Nipple pain. If you think your baby has not latched on correctly, slip your finger into the corner of your baby's mouth to break the suction and place it between your baby's gums. Attempt to start breastfeeding again. Signs of successful breastfeeding Signs from your baby  Your baby will gradually decrease the number of sucks or will completely stop sucking.  Your baby will fall asleep.  Your baby's body will relax.  Your baby will retain a small amount of milk in his or her mouth.  Your baby will let go of  your breast by himself or herself. Signs from you  Breasts that have increased in firmness, weight, and size 1-3 hours after feeding.  Breasts that are softer immediately after breastfeeding.  Increased milk volume, as well as a change in milk consistency and color by the fifth day of breastfeeding.  Nipples that are not sore, cracked, or bleeding. Signs that your baby is getting enough milk  Wetting at least 1-2 diapers during the first 24 hours after birth.  Wetting at least 5-6 diapers every 24 hours for the first week after birth. The urine should be clear or pale yellow by the age of 5 days.  Wetting 6-8 diapers every 24 hours as your baby continues to grow and develop.  At least 3 stools in a 24-hour period by the age of 5 days. The stool should be soft and yellow.  At least 3 stools in a 24-hour period by the age of 7 days. The stool should be seedy and yellow.  No  loss of weight greater than 10% of birth weight during the first 3 days of life.  Average weight gain of 4-7 oz (113-198 g) per week after the age of 4 days.  Consistent daily weight gain by the age of 5 days, without weight loss after the age of 2 weeks. After a feeding, your baby may spit up a small amount of milk. This is normal. Breastfeeding frequency and duration Frequent feeding will help you make more milk and can prevent sore nipples and extremely full breasts (breast engorgement). Breastfeed when you feel the need to reduce the fullness of your breasts or when your baby shows signs of hunger. This is called "breastfeeding on demand." Signs that your baby is hungry include:  Increased alertness, activity, or restlessness.  Movement of the head from side to side.  Opening of the mouth when the corner of the mouth or cheek is stroked (rooting).  Increased sucking sounds, smacking lips, cooing, sighing, or squeaking.  Hand-to-mouth movements and sucking on fingers or hands.  Fussing or crying. Avoid  introducing a pacifier to your baby in the first 4-6 weeks after your baby is born. After this time, you may choose to use a pacifier. Research has shown that pacifier use during the first year of a baby's life decreases the risk of sudden infant death syndrome (SIDS). Allow your baby to feed on each breast as long as he or she wants. When your baby unlatches or falls asleep while feeding from the first breast, offer the second breast. Because newborns are often sleepy in the first few weeks of life, you may need to awaken your baby to get him or her to feed. Breastfeeding times will vary from baby to baby. However, the following rules can serve as a guide to help you make sure that your baby is properly fed:  Newborns (babies 804 weeks of age or younger) may breastfeed every 1-3 hours.  Newborns should not go without breastfeeding for longer than 3 hours during the day or 5 hours during the night.  You should breastfeed your baby a minimum of 8 times in a 24-hour period. Breast milk pumping     Pumping and storing breast milk allows you to make sure that your baby is exclusively fed your breast milk, even at times when you are unable to breastfeed. This is especially important if you go back to work while you are still breastfeeding, or if you are not able to be present during feedings. Your lactation consultant can help you find a method of pumping that works best for you and give you guidelines about how long it is safe to store breast milk. Caring for your breasts while you breastfeed Nipples can become dry, cracked, and sore while breastfeeding. The following recommendations can help keep your breasts moisturized and healthy:  Avoid using soap on your nipples.  Wear a supportive bra designed especially for nursing. Avoid wearing underwire-style bras or extremely tight bras (sports bras).  Air-dry your nipples for 3-4 minutes after each feeding.  Use only cotton bra pads to absorb leaked  breast milk. Leaking of breast milk between feedings is normal.  Use lanolin on your nipples after breastfeeding. Lanolin helps to maintain your skin's normal moisture barrier. Pure lanolin is not harmful (not toxic) to your baby. You may also hand express a few drops of breast milk and gently massage that milk into your nipples and allow the milk to air-dry. In the first few weeks after giving  birth, some women experience breast engorgement. Engorgement can make your breasts feel heavy, warm, and tender to the touch. Engorgement peaks within 3-5 days after you give birth. The following recommendations can help to ease engorgement:  Completely empty your breasts while breastfeeding or pumping. You may want to start by applying warm, moist heat (in the shower or with warm, water-soaked hand towels) just before feeding or pumping. This increases circulation and helps the milk flow. If your baby does not completely empty your breasts while breastfeeding, pump any extra milk after he or she is finished.  Apply ice packs to your breasts immediately after breastfeeding or pumping, unless this is too uncomfortable for you. To do this: ? Put ice in a plastic bag. ? Place a towel between your skin and the bag. ? Leave the ice on for 20 minutes, 2-3 times a day.  Make sure that your baby is latched on and positioned properly while breastfeeding. If engorgement persists after 48 hours of following these recommendations, contact your health care provider or a Advertising copywriter. Overall health care recommendations while breastfeeding  Eat 3 healthy meals and 3 snacks every day. Well-nourished mothers who are breastfeeding need an additional 450-500 calories a day. You can meet this requirement by increasing the amount of a balanced diet that you eat.  Drink enough water to keep your urine pale yellow or clear.  Rest often, relax, and continue to take your prenatal vitamins to prevent fatigue, stress, and  low vitamin and mineral levels in your body (nutrient deficiencies).  Do not use any products that contain nicotine or tobacco, such as cigarettes and e-cigarettes. Your baby may be harmed by chemicals from cigarettes that pass into breast milk and exposure to secondhand smoke. If you need help quitting, ask your health care provider.  Avoid alcohol.  Do not use illegal drugs or marijuana.  Talk with your health care provider before taking any medicines. These include over-the-counter and prescription medicines as well as vitamins and herbal supplements. Some medicines that may be harmful to your baby can pass through breast milk.  It is possible to become pregnant while breastfeeding. If birth control is desired, ask your health care provider about options that will be safe while breastfeeding your baby. Where to find more information: [employer]: www.llli.org Contact a health care provider if:  You feel like you want to stop breastfeeding or have become frustrated with breastfeeding.  Your nipples are cracked or bleeding.  Your breasts are red, tender, or warm.  You have: ? Painful breasts or nipples. ? A swollen area on either breast. ? A fever or chills. ? Nausea or vomiting. ? Drainage other than breast milk from your nipples.  Your breasts do not become full before feedings by the fifth day after you give birth.  You feel sad and depressed.  Your baby is: ? Too sleepy to eat well. ? Having trouble sleeping. ? More than 51 week old and wetting fewer than 6 diapers in a 24-hour period. ? Not gaining weight by 65 days of age.  Your baby has fewer than 3 stools in a 24-hour period.  Your baby's skin or the white parts of his or her eyes become yellow. Get help right away if:  Your baby is overly tired (lethargic) and does not want to wake up and feed.  Your baby develops an unexplained fever. Summary  Breastfeeding offers many health benefits for  infant and mothers.  Try to breastfeed  your infant when he or she shows early signs of hunger.  Gently tickle or stroke your baby's lips with your finger or nipple to allow the baby to open his or her mouth. Bring the baby to your breast. Make sure that much of the areola is in your baby's mouth. Offer one side and burp the baby before you offer the other side.  Talk with your health care provider or lactation consultant if you have questions or you face problems as you breastfeed. This information is not intended to replace advice given to you by your health care provider. Make sure you discuss any questions you have with your health care provider. Document Released: 07/18/2005 Document Revised: 08/19/2016 Document Reviewed: 08/19/2016 Elsevier Interactive Patient Education  2019 [employer] Round Ligament Pain  The round ligament is a cord of muscle and tissue that helps support the uterus. It can become a source of pain during pregnancy if it becomes stretched or twisted as the baby grows. The pain usually begins in the second trimester (13-28 weeks) of pregnancy, and it can come and go until the baby is delivered. It is not a serious problem, and it does not cause harm to the baby. Round ligament pain is usually a short, sharp, and pinching pain, but it can also be a dull, lingering, and aching pain. The pain is felt in the lower side of the abdomen or in the groin. It usually starts deep in the groin and moves up to the outside of the hip area. The pain may occur when you:  Suddenly change position, such as quickly going from a sitting to standing position.  Roll over in bed.  Cough or sneeze.  Do physical activity. Follow these instructions at home:   Watch your condition for any changes.  When the pain starts, relax. Then try any of these methods to help with the pain: ? Sitting down. ? Flexing your knees up to your abdomen. ? Lying on your side with one pillow under your  abdomen and another pillow between your legs. ? Sitting in a warm bath for 15-20 minutes or until the pain goes away.  Take over-the-counter and prescription medicines only as told by your health care provider.  Move slowly when you sit down or stand up.  Avoid long walks if they cause pain.  Stop or reduce your physical activities if they cause pain.  Keep all follow-up visits as told by your health care provider. This is important. Contact a health care provider if:  Your pain does not go away with treatment.  You feel pain in your back that you did not have before.  Your medicine is not helping. Get help right away if:  You have a fever or chills.  You develop uterine contractions.  You have vaginal bleeding.  You have nausea or vomiting.  You have diarrhea.  You have pain when you urinate. Summary  Round ligament pain is felt in the lower abdomen or groin. It is usually a short, sharp, and pinching pain. It can also be a dull, lingering, and aching pain.  This pain usually begins in the second trimester (13-28 weeks). It occurs because the uterus is stretching with the growing baby, and it is not harmful to the baby.  You may notice the pain when you suddenly change position, when you cough or sneeze, or during physical activity.  Relaxing, flexing your knees to your abdomen, lying on one side, or taking a warm bath may  help to get rid of the pain.  Get help from your health care provider if the pain does not go away or if you have vaginal bleeding, nausea, vomiting, diarrhea, or painful urination. This information is not intended to replace advice given to you by your health care provider. Make sure you discuss any questions you have with your health care provider. Document Released: 04/26/2008 Document Revised: 01/03/2018 Document Reviewed: 01/03/2018 Elsevier Interactive Patient Education  2019 [employer] Back Pain in Pregnancy Back pain during pregnancy is  common. Back pain may be caused by several factors that are related to changes during your pregnancy. Follow these instructions at home: Managing pain, stiffness, and swelling      If directed, for sudden (acute) back pain, put ice on the painful area. ? Put ice in a plastic bag. ? Place a towel between your skin and the bag. ? Leave the ice on for 20 minutes, 2-3 times per day.  If directed, apply heat to the affected area before you exercise. Use the heat source that your health care provider recommends, such as a moist heat pack or a heating pad. ? Place a towel between your skin and the heat source. ? Leave the heat on for 20-30 minutes. ? Remove the heat if your skin turns bright red. This is especially important if you are unable to feel pain, heat, or cold. You may have a greater risk of getting burned.  If directed, massage the affected area. Activity  Exercise as told by your health care provider. Gentle exercise is the best way to prevent or manage back pain.  Listen to your body when lifting. If lifting hurts, ask for help or bend your knees. This uses your leg muscles instead of your back muscles.  Squat down when picking up something from the floor. Do not bend over.  Only use bed rest for short periods as told by your health care provider. Bed rest should only be used for the most severe episodes of back pain. Standing, sitting, and lying down  Do not stand in one place for long periods of time.  Use good posture when sitting. Make sure your head rests over your shoulders and is not hanging forward. Use a pillow on your lower back if necessary.  Try sleeping on your side, preferably the left side, with a pregnancy support pillow or 1-2 regular pillows between your legs. ? If you have back pain after a night's rest, your bed may be too soft. ? A firm mattress may provide more support for your back during pregnancy. General instructions  Do not wear high  heels.  Eat a healthy diet. Try to gain weight within your health care provider's recommendations.  Use a maternity girdle, elastic sling, or back brace as told by your health care provider.  Take over-the-counter and prescription medicines only as told by your health care provider.  Work with a physical therapist or massage therapist to find ways to manage back pain. Acupuncture or massage therapy may be helpful.  Keep all follow-up visits as told by your health care provider. This is important. Contact a health care provider if:  Your back pain interferes with your daily activities.  You have increasing pain in other parts of your body. Get help right away if:  You develop numbness, tingling, weakness, or problems with the use of your arms or legs.  You develop severe back pain that is not controlled with medicine.  You have a change  in bowel or bladder control.  You develop shortness of breath, dizziness, or you faint.  You develop nausea, vomiting, or sweating.  You have back pain that is a rhythmic, cramping pain similar to labor pains. Labor pain is usually 1-2 minutes apart, lasts for about 1 minute, and involves a bearing down feeling or pressure in your pelvis.  You have back pain and your water breaks or you have vaginal bleeding.  You have back pain or numbness that travels down your leg.  Your back pain developed after you fell.  You develop pain on one side of your back.  You see blood in your urine.  You develop skin blisters in the area of your back pain. Summary  Back pain may be caused by several factors that are related to changes during your pregnancy.  Follow instructions as told by your health care provider for managing pain, stiffness, and swelling.  Exercise as told by your health care provider. Gentle exercise is the best way to prevent or manage back pain.  Take over-the-counter and prescription medicines only as told by your health care  provider.  Keep all follow-up visits as told by your health care provider. This is important. This information is not intended to replace advice given to you by your health care provider. Make sure you discuss any questions you have with your health care provider. Document Released: 10/26/2005 Document Revised: 01/03/2018 Document Reviewed: 01/03/2018 Elsevier Interactive Patient Education  2019 [employer]. Third Trimester of Pregnancy  The third trimester is from week 28 through week 40 (months 7 through 9). This trimester is when your unborn baby (fetus) is growing very fast. At the end of the ninth month, the unborn baby is about 20 inches in length. It weighs about 6-10 pounds. Follow these instructions at home: Medicines  Take over-the-counter and prescription medicines only as told by your doctor. Some medicines are safe and some medicines are not safe during pregnancy.  Take a prenatal vitamin that contains at least 600 micrograms (mcg) of folic acid.  If you have trouble pooping (constipation), take medicine that will make your stool soft (stool softener) if your doctor approves. Eating and drinking   Eat regular, healthy meals.  Avoid raw meat and uncooked cheese.  If you get low calcium from the food you eat, talk to your doctor about taking a daily calcium supplement.  Eat four or five small meals rather than three large meals a day.  Avoid foods that are high in fat and sugars, such as fried and sweet foods.  To prevent constipation: ? Eat foods that are high in fiber, like fresh fruits and vegetables, whole grains, and beans. ? Drink enough fluids to keep your pee (urine) clear or pale yellow. Activity  Exercise only as told by your doctor. Stop exercising if you start to have cramps.  Avoid heavy lifting, wear low heels, and sit up straight.  Do not exercise if it is too hot, too humid, or if you are in a place of great height (high altitude).  You may  continue to have sex unless your doctor tells you not to. Relieving pain and discomfort  Wear a good support bra if your breasts are tender.  Take frequent breaks and rest with your legs raised if you have leg cramps or low back pain.  Take warm water baths (sitz baths) to soothe pain or discomfort caused by hemorrhoids. Use hemorrhoid cream if your doctor approves.  If you develop puffy,  bulging veins (varicose veins) in your legs: ? Wear support hose or compression stockings as told by your doctor. ? Raise (elevate) your feet for 15 minutes, 3-4 times a day. ? Limit salt in your food. Safety  Wear your seat belt when driving.  Make a list of emergency phone numbers, including numbers for family, friends, the hospital, and police and fire departments. Preparing for your baby's arrival To prepare for the arrival of your baby:  Take prenatal classes.  Practice driving to the hospital.  Visit the hospital and tour the maternity area.  Talk to your work about taking leave once the baby comes.  Pack your hospital bag.  Prepare the baby's room.  Go to your doctor visits.  Buy a rear-facing car seat. Learn how to install it in your car. General instructions  Do not use hot tubs, steam rooms, or saunas.  Do not use any products that contain nicotine or tobacco, such as cigarettes and e-cigarettes. If you need help quitting, ask your doctor.  Do not drink alcohol.  Do not douche or use tampons or scented sanitary pads.  Do not cross your legs for long periods of time.  Do not travel for long distances unless you must. Only do so if your doctor says it is okay.  Visit your dentist if you have not gone during your pregnancy. Use a soft toothbrush to brush your teeth. Be gentle when you floss.  Avoid cat litter boxes and soil used by cats. These carry germs that can cause birth defects in the baby and can cause a loss of your baby (miscarriage) or stillbirth.  Keep all your  prenatal visits as told by your doctor. This is important. Contact a doctor if:  You are not sure if you are in labor or if your water has broken.  You are dizzy.  You have mild cramps or pressure in your lower belly.  You have a nagging pain in your belly area.  You continue to feel sick to your stomach, you throw up, or you have watery poop.  You have bad smelling fluid coming from your vagina.  You have pain when you pee. Get help right away if:  You have a fever.  You are leaking fluid from your vagina.  You are spotting or bleeding from your vagina.  You have severe belly cramps or pain.  You lose or gain weight quickly.  You have trouble catching your breath and have chest pain.  You notice sudden or extreme puffiness (swelling) of your face, hands, ankles, feet, or legs.  You have not felt the baby move in over an hour.  You have severe headaches that do not go away with medicine.  You have trouble seeing.  You are leaking, or you are having a gush of fluid, from your vagina before you are 37 weeks.  You have regular belly spasms (contractions) before you are 37 weeks. Summary  The third trimester is from week 28 through week 40 (months 7 through 9). This time is when your unborn baby is growing very fast.  Follow your doctor's advice about medicine, food, and activity.  Get ready for the arrival of your baby by taking prenatal classes, getting all the baby items ready, preparing the baby's room, and visiting your doctor to be checked.  Get help right away if you are bleeding from your vagina, or you have chest pain and trouble catching your breath, or if you have not felt your baby  move in over an hour. This information is not intended to replace advice given to you by your health care provider. Make sure you discuss any questions you have with your health care provider. Document Released: 10/12/2009 Document Revised: 08/23/2016 Document Reviewed:  08/23/2016 Elsevier Interactive Patient Education  2019 [employer] WHAT OB PATIENTS CAN EXPECT  Confirmation of pregnancy and ultrasound ordered if medically indicated-[redacted] weeks gestation New OB (NOB) intake with nurse and New OB (NOB) labs- [redacted] weeks gestation New OB (NOB) physical examination with provider- 11/[redacted] weeks gestation Flu vaccine-[redacted] weeks gestation Anatomy scan-[redacted] weeks gestation Glucose tolerance test, blood work to test for anemia, T-dap vaccine-[redacted] weeks gestation Vaginal swabs/cultures-STD/Group B strep-[redacted] weeks gestation Appointments every 4 weeks until 28 weeks Every 2 weeks from 28 weeks until 36 weeks Weekly visits from 36 weeks until delivery  [facility]  [address] [place], [place], [place] [address]  Phone: [phone]   [facility] (second location)  [address] [place], [place] [address]  Phone: [phone]   [facility]) [address] [place], [place], [place] [address] Phone: [phone]   [facility]  [address], [place], [place] [address]  Phone: [phone]

## 2018-07-20 NOTE — Progress Notes (Signed)
ROB-Reports constant back pain, started using belly band yesterday with some relief. Discussed other home treatment measures. Anticipatory guidance regarding course of prenatal care. Reviewed red flag symptoms and when to call. RTC x 4 weeks for 28 week labs and ROB or sooner if needed.

## 2018-07-29 ENCOUNTER — Encounter: Payer: Self-pay | Admitting: Certified Nurse Midwife

## 2018-08-01 NOTE — L&D Delivery Note (Signed)
Delivery Note   Tamara Bishop is a 23 y.o. G1P0000 at [redacted]w[redacted]d Estimated Date of Delivery: 11/06/18  PRE-OPERATIVE DIAGNOSIS:  1) [redacted]w[redacted]d pregnancy. Pre eclampsia    POST-OPERATIVE DIAGNOSIS:  1) [redacted]w[redacted]d pregnancy s/p Vaginal, Spontaneous , Pre eclampsia   Delivery Type: Vaginal, Spontaneous    Delivery Anesthesia: Epidural   Labor Complications:   none    ESTIMATED BLOOD LOSS: 847 ml    FINDINGS:   1) female infant, Apgar scores of 8  at 1 minute and 9 at 5 minutes and a birthweight of 2970 g.    2) Nuchal cord: Non  SPECIMENS:   PLACENTA:   Appearance: Intact , 3 vessel cord   Removal: Spontaneous      Disposition:   held per protocol then discarded.   DISPOSITION:  Infant to left in stable condition in the delivery room, with L&D personnel and mother,  NARRATIVE SUMMARY: Labor course:  Tamara Bishop is a G1P0000 at [redacted]w[redacted]d who presented for induction of labor due to pre eclampsia.  She progressed in labor with Cytotec, AROM, and pitocin.  She received the appropriate Epidural anesthesia and proceeded to complete dilation. She evidenced good maternal expulsive effort during the second stage. She went on to deliver a viable female infant "Piper". The placenta delivered without problems and was noted to be complete. A perineal and vaginal examination was performed. Lacerations: 2nd degree;Perineal;Labial  Lacerations was repaired with 3-0 Vicryl Rapide suture using local anesthesia. 800 mcg placed rectally. The patient tolerated this well.  [name], CNM  10/17/2018 10:06 PM

## 2018-08-02 ENCOUNTER — Telehealth: Payer: Self-pay

## 2018-08-02 ENCOUNTER — Encounter: Payer: Self-pay | Admitting: Certified Nurse Midwife

## 2018-08-02 NOTE — Telephone Encounter (Signed)
The patient called at 10:07 to follow up with her pain and wanted someone to please call her.  She woke up with severe pain, and is coming up on her 28 weeks soon.  She stated the pain made her vomit x1, and she is dizzy some with the pain, please advise, thanks.

## 2018-08-02 NOTE — Telephone Encounter (Signed)
Spoke with patient, c/o having abdominal cramping that started today, no VB, no ROM, no abnormal vaginal discharge.  Advised patient to get rest, drink plenty of fluids and call back if symptoms get worse. Patient verbalized understanding.

## 2018-08-17 ENCOUNTER — Ambulatory Visit (INDEPENDENT_AMBULATORY_CARE_PROVIDER_SITE_OTHER): Payer: Medicaid Other | Admitting: Certified Nurse Midwife

## 2018-08-17 ENCOUNTER — Encounter: Payer: Self-pay | Admitting: Certified Nurse Midwife

## 2018-08-17 ENCOUNTER — Other Ambulatory Visit: Payer: Medicaid Other

## 2018-08-17 VITALS — BP 124/73 | HR 84 | Wt 167.6 lb

## 2018-08-17 DIAGNOSIS — Z3493 Encounter for supervision of normal pregnancy, unspecified, third trimester: Secondary | ICD-10-CM | POA: Diagnosis not present

## 2018-08-17 DIAGNOSIS — Z3492 Encounter for supervision of normal pregnancy, unspecified, second trimester: Secondary | ICD-10-CM | POA: Diagnosis not present

## 2018-08-17 DIAGNOSIS — Z23 Encounter for immunization: Secondary | ICD-10-CM | POA: Diagnosis not present

## 2018-08-17 DIAGNOSIS — Z130 Encounter for screening for diseases of the blood and blood-forming organs and certain disorders involving the immune mechanism: Secondary | ICD-10-CM

## 2018-08-17 DIAGNOSIS — Z131 Encounter for screening for diabetes mellitus: Secondary | ICD-10-CM

## 2018-08-17 LAB — POCT URINALYSIS DIPSTICK OB
BILIRUBIN UA: NEGATIVE
Glucose, UA: NEGATIVE
Ketones, UA: NEGATIVE
LEUKOCYTES UA: NEGATIVE
Nitrite, UA: NEGATIVE
PH UA: 8 (ref 5.0–8.0)
RBC UA: NEGATIVE
Spec Grav, UA: 1.01 (ref 1.010–1.025)
UROBILINOGEN UA: 0.2 U/dL

## 2018-08-17 MED ORDER — TETANUS-DIPHTH-ACELL PERTUSSIS 5-2.5-18.5 LF-MCG/0.5 IM SUSP
0.5000 mL | Freq: Once | INTRAMUSCULAR | Status: AC
  Administered 2018-08-17: 0.5 mL via INTRAMUSCULAR

## 2018-08-17 NOTE — Patient Instructions (Signed)
Glucose Tolerance Test During Pregnancy Why am I having this test? The glucose tolerance test (GTT) is done to check how your body processes sugar (glucose). This is one of several tests used to diagnose diabetes that develops during pregnancy (gestational diabetes mellitus). Gestational diabetes is a temporary form of diabetes that some women develop during pregnancy. It usually occurs during the second trimester of pregnancy and goes away after delivery. Testing (screening) for gestational diabetes usually occurs between 24 and 28 weeks of pregnancy. You may have the GTT test after having a 1-hour glucose screening test if the results from that test indicate that you may have gestational diabetes. You may also have this test if:  You have a history of gestational diabetes.  You have a history of giving birth to very large babies or have experienced repeated fetal loss (stillbirth).  You have signs and symptoms of diabetes, such as: ? Changes in your vision. ? Tingling or numbness in your hands or feet. ? Changes in hunger, thirst, and urination that are not otherwise explained by your pregnancy. What is being tested? This test measures the amount of glucose in your blood at different times during a period of 3 hours. This indicates how well your body is able to process glucose. What kind of sample is taken?  Blood samples are required for this test. They are usually collected by inserting a needle into a blood vessel. How do I prepare for this test?  For 3 days before your test, eat normally. Have plenty of carbohydrate-rich foods.  Follow instructions from your health care provider about: ? Eating or drinking restrictions on the day of the test. You may be asked to not eat or drink anything other than water (fast) starting 8-10 hours before the test. ? Changing or stopping your regular medicines. Some medicines may interfere with this test. Tell a health care provider about:  All  medicines you are taking, including vitamins, herbs, eye drops, creams, and over-the-counter medicines.  Any blood disorders you have.  Any surgeries you have had.  Any medical conditions you have. What happens during the test? First, your blood glucose will be measured. This is referred to as your fasting blood glucose, since you fasted before the test. Then, you will drink a glucose solution that contains a certain amount of glucose. Your blood glucose will be measured again 1, 2, and 3 hours after drinking the solution. This test takes about 3 hours to complete. You will need to stay at the testing location during this time. During the testing period:  Do not eat or drink anything other than the glucose solution.  Do not exercise.  Do not use any products that contain nicotine or tobacco, such as cigarettes and e-cigarettes. If you need help stopping, ask your health care provider. The testing procedure may vary among health care providers and hospitals. How are the results reported? Your results will be reported as milligrams of glucose per deciliter of blood (mg/dL) or millimoles per liter (mmol/L). Your health care provider will compare your results to normal ranges that were established after testing a large group of people (reference ranges). Reference ranges may vary among labs and hospitals. For this test, common reference ranges are:  Fasting: less than 95-105 mg/dL (5.3-5.8 mmol/L).  1 hour after drinking glucose: less than 180-190 mg/dL (10.0-10.5 mmol/L).  2 hours after drinking glucose: less than 155-165 mg/dL (8.6-9.2 mmol/L).  3 hours after drinking glucose: 140-145 mg/dL (7.8-8.1 mmol/L). What do the   results mean? Results within reference ranges are considered normal, meaning that your glucose levels are well-controlled. If two or more of your blood glucose levels are high, you may be diagnosed with gestational diabetes. If only one level is high, your health care  provider may suggest repeat testing or other tests to confirm a diagnosis. Talk with your health care provider about what your results mean. Questions to ask your health care provider Ask your health care provider, or the department that is doing the test:  When will my results be ready?  How will I get my results?  What are my treatment options?  What other tests do I need?  What are my next steps? Summary  The glucose tolerance test (GTT) is one of several tests used to diagnose diabetes that develops during pregnancy (gestational diabetes mellitus). Gestational diabetes is a temporary form of diabetes that some women develop during pregnancy.  You may have the GTT test after having a 1-hour glucose screening test if the results from that test indicate that you may have gestational diabetes. You may also have this test if you have any symptoms or risk factors for gestational diabetes.  Talk with your health care provider about what your results mean. This information is not intended to replace advice given to you by your health care provider. Make sure you discuss any questions you have with your health care provider. Document Released: 01/17/2012 Document Revised: 02/27/2017 Document Reviewed: 02/27/2017 Elsevier Interactive Patient Education  2019 [employer] Inc[employer]  

## 2018-08-17 NOTE — Progress Notes (Signed)
ROB doing well. BTC/CBC/RPR/Tdap today. Sample birth plan given. Reviewed BC after baby. She is thinking she will use the POP while breastfeeding. Pamphlet given. Plans on Epidural for birth. She has complaints of anxiety and depression. PhQ9 score 2, GAD score 10. Discussed with pt. Information given on Zoloft  In pregnancy. Offered counseling. Pt declines at this time. She is unsure about taking medication. She will review and decide. Will follow up with results. Return in 2 wk.   [name], CNM

## 2018-08-17 NOTE — Addendum Note (Signed)
Addended by: Brooke Dare on: 08/17/2018 11:09 AM   Modules accepted: Orders

## 2018-08-18 LAB — CBC
Hematocrit: 29.4 % — ABNORMAL LOW (ref 34.0–46.6)
Hemoglobin: 9.7 g/dL — ABNORMAL LOW (ref 11.1–15.9)
MCH: 29.1 pg (ref 26.6–33.0)
MCHC: 33 g/dL (ref 31.5–35.7)
MCV: 88 fL (ref 79–97)
PLATELETS: 168 10*3/uL (ref 150–450)
RBC: 3.33 x10E6/uL — AB (ref 3.77–5.28)
RDW: 12.1 % (ref 11.7–15.4)
WBC: 13.8 10*3/uL — AB (ref 3.4–10.8)

## 2018-08-18 LAB — GLUCOSE, 1 HOUR GESTATIONAL: Gestational Diabetes Screen: 105 mg/dL (ref 65–139)

## 2018-08-18 LAB — RPR: RPR Ser Ql: NONREACTIVE

## 2018-08-22 ENCOUNTER — Telehealth: Payer: Self-pay

## 2018-08-22 ENCOUNTER — Encounter: Payer: Self-pay | Admitting: Certified Nurse Midwife

## 2018-08-22 ENCOUNTER — Ambulatory Visit (INDEPENDENT_AMBULATORY_CARE_PROVIDER_SITE_OTHER): Payer: Medicaid Other | Admitting: Certified Nurse Midwife

## 2018-08-22 DIAGNOSIS — Z3403 Encounter for supervision of normal first pregnancy, third trimester: Secondary | ICD-10-CM

## 2018-08-22 NOTE — Telephone Encounter (Signed)
This Clinical research associate received a Clinical cytogeneticist message from pt asking me to call her. Call placed- pt states she strained while having a bowel movement and is now c/o umbilical "pain". Denies spotting, bruising around umbilicus just c/o "pain" . Asked pt if she would like to come by so it could be looked at. She agreed. Arrived and put in a room. The area she pointed to as the spot of discomfort is near a belly button ring scar. Enter and exist sites slightly red. No swelling or discharge from site. When asked if baby was moving like usual she stated "no usually she is crazy". I asked her if she ate today- she stated no. A slice of pizza and a cold glass of water was given to pt. AT informed and pt put on her schedule.

## 2018-08-22 NOTE — Patient Instructions (Signed)

## 2018-08-22 NOTE — Progress Notes (Signed)
Pt complains of pain and irritation where belly button ring used to be. On exam redness noted at site where ring entered and exited skin. Discussed scar tissue and stretching of skin at that site. Recommend use of vitamin E oil. She also complains of decreased fetal movement but admits to not eating today. Fetal heart tones baseline 140s with accel into 150's. Fetal movement felt during ascultation. Discussed anterior placenta. Reassurance given. Follow up as scheduled.

## 2018-08-31 ENCOUNTER — Ambulatory Visit (INDEPENDENT_AMBULATORY_CARE_PROVIDER_SITE_OTHER): Payer: Medicaid Other | Admitting: Obstetrics and Gynecology

## 2018-08-31 VITALS — BP 128/76 | HR 96 | Wt 171.3 lb

## 2018-08-31 DIAGNOSIS — Z3493 Encounter for supervision of normal pregnancy, unspecified, third trimester: Secondary | ICD-10-CM

## 2018-08-31 LAB — POCT URINALYSIS DIPSTICK OB
BILIRUBIN UA: NEGATIVE
Blood, UA: NEGATIVE
GLUCOSE, UA: NEGATIVE
Ketones, UA: NEGATIVE
Nitrite, UA: NEGATIVE
Spec Grav, UA: 1.01 (ref 1.010–1.025)
Urobilinogen, UA: 0.2 E.U./dL
pH, UA: 7.5 (ref 5.0–8.0)

## 2018-08-31 NOTE — Progress Notes (Signed)
ROB- pt c/o low back pain, having some swelling in her ankles, having nose bleeds

## 2018-08-31 NOTE — Progress Notes (Signed)
ROB- dicussed iron rich diet, discussed fluid retention and anemia, has enrolled in classes. Taking iron OTC supplement. Encouraged compression hose.

## 2018-08-31 NOTE — Patient Instructions (Signed)

## 2018-08-31 NOTE — Addendum Note (Signed)
Addended by: Brooke Dare on: 08/31/2018 04:32 PM   Modules accepted: Orders

## 2018-09-02 LAB — URINE CULTURE

## 2018-09-03 ENCOUNTER — Ambulatory Visit (INDEPENDENT_AMBULATORY_CARE_PROVIDER_SITE_OTHER): Payer: Medicaid Other | Admitting: Certified Nurse Midwife

## 2018-09-03 ENCOUNTER — Encounter: Payer: Self-pay | Admitting: Certified Nurse Midwife

## 2018-09-03 VITALS — BP 121/77 | HR 87 | Wt 171.6 lb

## 2018-09-03 DIAGNOSIS — Z3493 Encounter for supervision of normal pregnancy, unspecified, third trimester: Secondary | ICD-10-CM | POA: Diagnosis not present

## 2018-09-03 DIAGNOSIS — H9209 Otalgia, unspecified ear: Secondary | ICD-10-CM

## 2018-09-03 DIAGNOSIS — R0981 Nasal congestion: Secondary | ICD-10-CM

## 2018-09-03 DIAGNOSIS — R110 Nausea: Secondary | ICD-10-CM | POA: Diagnosis not present

## 2018-09-03 DIAGNOSIS — Z20828 Contact with and (suspected) exposure to other viral communicable diseases: Secondary | ICD-10-CM | POA: Diagnosis not present

## 2018-09-03 LAB — POCT URINALYSIS DIPSTICK OB
BILIRUBIN UA: NEGATIVE
Blood, UA: NEGATIVE
GLUCOSE, UA: NEGATIVE
Ketones, UA: NEGATIVE
Nitrite, UA: NEGATIVE
SPEC GRAV UA: 1.01 (ref 1.010–1.025)
Urobilinogen, UA: 0.2 E.U./dL
pH, UA: 7.5 (ref 5.0–8.0)

## 2018-09-03 MED ORDER — OSELTAMIVIR PHOSPHATE 75 MG PO CAPS: 75.0000 mg | ORAL_CAPSULE | Freq: Every day | ORAL | 0 refills | Status: AC

## 2018-09-03 MED ORDER — ONDANSETRON 4 MG PO TBDP: 4.0000 mg | ORAL_TABLET | Freq: Four times a day (QID) | ORAL | 0 refills | Status: DC | PRN

## 2018-09-03 NOTE — Assessment & Plan Note (Signed)
09/01/2018 

## 2018-09-03 NOTE — Patient Instructions (Addendum)
WE WOULD LOVE TO HEAR FROM YOU!!!!   Thank you Blima Rich for visiting [facility].  Providing our patients with the best experience possible is really important to [place], and we hope that you felt that on your recent visit. The most valuable feedback we get comes from [place]!!    If you receive a survey please take a couple of minutes to let us know how we did.Thank you for continuing to trust [place] with your care.   [facility]   Third Trimester of Pregnancy  The third trimester is from week 28 through week 40 (months 7 through 9). This trimester is when your unborn baby (fetus) is growing very fast. At the end of the ninth month, the unborn baby is about 20 inches in length. It weighs about 6-10 pounds. Follow these instructions at home: Medicines  Take over-the-counter and prescription medicines only as told by your doctor. Some medicines are safe and some medicines are not safe during pregnancy.  Take a prenatal vitamin that contains at least 600 micrograms (mcg) of folic acid.  If you have trouble pooping (constipation), take medicine that will make your stool soft (stool softener) if your doctor approves. Eating and drinking   Eat regular, healthy meals.  Avoid raw meat and uncooked cheese.  If you get low calcium from the food you eat, talk to your doctor about taking a daily calcium supplement.  Eat four or five small meals rather than three large meals a day.  Avoid foods that are high in fat and sugars, such as fried and sweet foods.  To prevent constipation: ? Eat foods that are high in fiber, like fresh fruits and vegetables, whole grains, and beans. ? Drink enough fluids to keep your pee (urine) clear or pale yellow. Activity  Exercise only as told by your doctor. Stop exercising if you start to have cramps.  Avoid heavy lifting, wear low heels, and sit up straight.  Do not exercise if it is too hot, too humid, or if you  are in a place of great height (high altitude).  You may continue to have sex unless your doctor tells you not to. Relieving pain and discomfort  Wear a good support bra if your breasts are tender.  Take frequent breaks and rest with your legs raised if you have leg cramps or low back pain.  Take warm water baths (sitz baths) to soothe pain or discomfort caused by hemorrhoids. Use hemorrhoid cream if your doctor approves.  If you develop puffy, bulging veins (varicose veins) in your legs: ? Wear support hose or compression stockings as told by your doctor. ? Raise (elevate) your feet for 15 minutes, 3-4 times a day. ? Limit salt in your food. Safety  Wear your seat belt when driving.  Make a list of emergency phone numbers, including numbers for family, friends, the hospital, and police and fire departments. Preparing for your baby's arrival To prepare for the arrival of your baby:  Take prenatal classes.  Practice driving to the hospital.  Visit the hospital and tour the maternity area.  Talk to your work about taking leave once the baby comes.  Pack your hospital bag.  Prepare the baby's room.  Go to your doctor visits.  Buy a rear-facing car seat. Learn how to install it in your car. General instructions  Do not use hot tubs, steam rooms, or saunas.  Do not use any products that contain nicotine or tobacco, such as  cigarettes and e-cigarettes. If you need help quitting, ask your doctor.  Do not drink alcohol.  Do not douche or use tampons or scented sanitary pads.  Do not cross your legs for long periods of time.  Do not travel for long distances unless you must. Only do so if your doctor says it is okay.  Visit your dentist if you have not gone during your pregnancy. Use a soft toothbrush to brush your teeth. Be gentle when you floss.  Avoid cat litter boxes and soil used by cats. These carry germs that can cause birth defects in the baby and can cause a loss  of your baby (miscarriage) or stillbirth.  Keep all your prenatal visits as told by your doctor. This is important. Contact a doctor if:  You are not sure if you are in labor or if your water has broken.  You are dizzy.  You have mild cramps or pressure in your lower belly.  You have a nagging pain in your belly area.  You continue to feel sick to your stomach, you throw up, or you have watery poop.  You have bad smelling fluid coming from your vagina.  You have pain when you pee. Get help right away if:  You have a fever.  You are leaking fluid from your vagina.  You are spotting or bleeding from your vagina.  You have severe belly cramps or pain.  You lose or gain weight quickly.  You have trouble catching your breath and have chest pain.  You notice sudden or extreme puffiness (swelling) of your face, hands, ankles, feet, or legs.  You have not felt the baby move in over an hour.  You have severe headaches that do not go away with medicine.  You have trouble seeing.  You are leaking, or you are having a gush of fluid, from your vagina before you are 37 weeks.  You have regular belly spasms (contractions) before you are 37 weeks. Summary  The third trimester is from week 28 through week 40 (months 7 through 9). This time is when your unborn baby is growing very fast.  Follow your doctor's advice about medicine, food, and activity.  Get ready for the arrival of your baby by taking prenatal classes, getting all the baby items ready, preparing the baby's room, and visiting your doctor to be checked.  Get help right away if you are bleeding from your vagina, or you have chest pain and trouble catching your breath, or if you have not felt your baby move in over an hour. This information is not intended to replace advice given to you by your health care provider. Make sure you discuss any questions you have with your health care provider. Document Released: 10/12/2009  Document Revised: 08/23/2016 Document Reviewed: 08/23/2016 Elsevier Interactive Patient Education  2019 [place].   Fetal Movement Counts Patient Name: ________________________________________________ Patient Due Date: ____________________ What is a fetal movement count?  A fetal movement count is the number of times that you feel your baby move during a certain amount of time. This may also be called a fetal kick count. A fetal movement count is recommended for every pregnant woman. You may be asked to start counting fetal movements as early as week 28 of your pregnancy. Pay attention to when your baby is most active. You may notice your baby's sleep and wake cycles. You may also notice things that make your baby move more. You should do a fetal movement count:  When your baby is normally most active.  At the same time each day. A good time to count movements is while you are resting, after having something to eat and drink. How do I count fetal movements? 1. Find a quiet, comfortable area. Sit, or lie down on your side. 2. Write down the date, the start time and stop time, and the number of movements that you felt between those two times. Take this information with you to your health care visits. 3. For 2 hours, count kicks, flutters, swishes, rolls, and jabs. You should feel at least 10 movements during 2 hours. 4. You may stop counting after you have felt 10 movements. 5. If you do not feel 10 movements in 2 hours, have something to eat and drink. Then, keep resting and counting for 1 hour. If you feel at least 4 movements during that hour, you may stop counting. Contact a health care provider if:  You feel fewer than 4 movements in 2 hours.  Your baby is not moving like he or she usually does. Date: ____________ Start time: ____________ Stop time: ____________ Movements: ____________ Date: ____________ Start time: ____________ Stop time: ____________ Movements:  ____________ Date: ____________ Start time: ____________ Stop time: ____________ Movements: ____________ Date: ____________ Start time: ____________ Stop time: ____________ Movements: ____________ Date: ____________ Start time: ____________ Stop time: ____________ Movements: ____________ Date: ____________ Start time: ____________ Stop time: ____________ Movements: ____________ Date: ____________ Start time: ____________ Stop time: ____________ Movements: ____________ Date: ____________ Start time: ____________ Stop time: ____________ Movements: ____________ Date: ____________ Start time: ____________ Stop time: ____________ Movements: ____________ This information is not intended to replace advice given to you by your health care provider. Make sure you discuss any questions you have with your health care provider. Document Released: 08/17/2006 Document Revised: 03/16/2016 Document Reviewed: 08/27/2015 Elsevier Interactive Patient Education  2019 [employer].   Common Medications Safe in Pregnancy  Acne:      Constipation:  Benzoyl Peroxide     Colace  Clindamycin      Dulcolax Suppository  Topica Erythromycin     Fibercon  Salicylic Acid      Metamucil         Miralax AVOID:        Senakot   Accutane    Cough:  Retin-A       Cough Drops  Tetracycline      Phenergan w/ Codeine if Rx  Minocycline      Robitussin (Plain & DM)  Antibiotics:     Crabs/Lice:  Ceclor       RID  Cephalosporins    AVOID:  E-Mycins      Kwell  Keflex  Macrobid/Macrodantin   Diarrhea:  Penicillin      Kao-Pectate  Zithromax      Imodium AD         PUSH FLUIDS AVOID:       Cipro     Fever:  Tetracycline      Tylenol (Regular or Extra  Minocycline       Strength)  Levaquin      Extra Strength-Do not          Exceed 8 tabs/24 hrs Caffeine:        <29m/day (equiv. To 1 cup of coffee or  approx. 3 12 oz  sodas)         Gas: Cold/Hayfever:       Gas-X  Benadryl      Mylicon  Claritin  Phazyme  **Claritin-D        Chlor-Trimeton    Headaches:  Dimetapp      ASA-Free Excedrin  Drixoral-Non-Drowsy     Cold Compress  Mucinex (Guaifenasin)     Tylenol (Regular or Extra  Sudafed/Sudafed-12 Hour     Strength)  **Sudafed PE Pseudoephedrine   Tylenol Cold & Sinus     Vicks Vapor Rub  Zyrtec  **AVOID if Problems With Blood Pressure         Heartburn: Avoid lying down for at least 1 hour after meals  Aciphex      Maalox     Rash:  Milk of Magnesia     Benadryl    Mylanta       1% Hydrocortisone Cream  Pepcid  Pepcid Complete   Sleep Aids:  Prevacid      Ambien   Prilosec       Benadryl  Rolaids       Chamomile Tea  Tums (Limit 4/day)     Unisom  Zantac       Tylenol PM         Warm milk-add vanilla or  Hemorrhoids:       Sugar for taste  Anusol/Anusol H.C.  (RX: Analapram 2.5%)  Sugar Substitutes:  Hydrocortisone OTC     Ok in moderation  Preparation H      Tucks        Vaseline lotion applied to tissue with wiping    Herpes:     Throat:  Acyclovir      Oragel  Famvir  Valtrex     Vaccines:         Flu Shot Leg Cramps:       *Gardasil  Benadryl      Hepatitis A         Hepatitis B Nasal Spray:       Pneumovax  Saline Nasal Spray     Polio Booster         Tetanus Nausea:       Tuberculosis test or PPD  Vitamin B6 25 mg TID   AVOID:    Dramamine      *Gardasil  Emetrol       Live Poliovirus  Ginger Root 250 mg QID    MMR (measles, mumps &  High Complex Carbs @ Bedtime    rebella)  Sea Bands-Accupressure    Varicella (Chickenpox)  Unisom 1/2 tab TID     *No known complications           If received before Pain:         Known pregnancy;   Darvocet       Resume series after  Lortab        Delivery  Percocet    Yeast:   Tramadol      Femstat  Tylenol 3      Gyne-lotrimin  Ultram       Monistat  Vicodin           MISC:         All  Sunscreens           Hair Coloring/highlights          Insect Repellant's          (Including DEET)         Mystic Tans

## 2018-09-03 NOTE — Progress Notes (Signed)
Subjective:   Tamara Bishop is a 23 y.o. G1P0000 [redacted]w[redacted]d being seen today for work in problem visit.    Reports nasal congestion, sore throat, bilateral ear ache, and nausea without vomiting. Taking Tylenol every six (6) hours without relief of symptoms.   Two (2) coworkers diagnosed with flu on Saturday.    Denies contractions, vaginal bleeding or leaking of fluid.  Reports good fetal movement.  The following portions of the patient's history were reviewed and updated as appropriate: allergies, current medications, past family history, past medical history, past social history, past surgical history and problem list.   Review of Systems:  ROS negative except as noted above. Information obtained from patient.   Objective:   BP 121/77   Pulse 87   Wt 171 lb 9.6 oz (77.8 kg)   LMP 01/22/2018 (Approximate)   BMI 32.42 kg/m   Temperature: 98.8 F oral  FHT: Fetal Heart Rate (bpm): 140  Fetal Movement: Movement: Present    ENT:  Bilateral bulging tympanic membranes present  Heart: Regular rate and rhythm present  Lungs: Normal respiratory effort, chest expands symmetrically. Lungs are clear to auscultation, no crackles or wheezes.  Abdomen:  soft, gravid, appropriate for gestational age,non-tender   Results for orders placed or performed in visit on 09/03/18 (from the past 24 hour(s))  POC Urinalysis Dipstick OB     Status: Abnormal   Collection Time: 09/03/18 10:15 AM  Result Value Ref Range   Color, UA Yellow    Clarity, UA Clear    Glucose, UA Negative Negative   Bilirubin, UA Negative    Ketones, UA Negative    Spec Grav, UA 1.010 1.010 - 1.025   Blood, UA Negative    pH, UA 7.5 5.0 - 8.0   POC,PROTEIN,UA Trace Negative, Trace, Small (1+), Moderate (2+), Large (3+), 4+   Urobilinogen, UA 0.2 0.2 or 1.0 E.U./dL   Nitrite, UA Negative    Leukocytes, UA Moderate (2+) (A) Negative   Appearance     Odor     Rapid flu swab: negative  Assessment and Plan:    Pregnancy:  G1P0000 at [redacted]w[redacted]d  1. Third trimester pregnancy  - POC Urinalysis Dipstick OB  2. Exposure to the flu   3. Ear ache   4. Congestion of nasal sinus   5. Nausea   Plan:   Rx Tamiflu and Zofran, see orders.   Discussed home treatment measures; see AVS.   Work note given, Merchandiser, retail.   Preterm labor symptoms: vaginal bleeding, contractions and leaking of fluid reviewed in detail.  Fetal movement precautions reviewed.  Follow up in 2 weeks as previously scheduled or sooner if needed.   Gunnar Bulla, CNM [facility], [facility] 09/03/18 11:55 AM

## 2018-09-03 NOTE — Progress Notes (Signed)
Patient c/o sore throat, nasal congestion, ear ache, nausea, cough and felt hot x2 days.  Patient taking Tylenol every 6 hours with no relief. Temp in office 98.8.

## 2018-09-04 LAB — POCT INFLUENZA A/B
INFLUENZA A, POC: NEGATIVE
INFLUENZA B, POC: NEGATIVE

## 2018-09-04 NOTE — Addendum Note (Signed)
Addended by: Marchelle Folks on: 09/04/2018 08:17 AM   Modules accepted: Orders

## 2018-09-11 ENCOUNTER — Other Ambulatory Visit: Payer: Self-pay | Admitting: Obstetrics and Gynecology

## 2018-09-11 MED ORDER — FLUCONAZOLE 100 MG PO TABS: 100.0000 mg | ORAL_TABLET | Freq: Every day | ORAL | 1 refills | Status: DC

## 2018-09-14 ENCOUNTER — Ambulatory Visit (INDEPENDENT_AMBULATORY_CARE_PROVIDER_SITE_OTHER): Payer: Medicaid Other | Admitting: Certified Nurse Midwife

## 2018-09-14 ENCOUNTER — Other Ambulatory Visit (HOSPITAL_COMMUNITY)
Admission: RE | Admit: 2018-09-14 | Discharge: 2018-09-14 | Disposition: A | Discharge status: Home / Self Care | Payer: Medicaid Other | Source: Ambulatory Visit | Attending: Certified Nurse Midwife | Admitting: Certified Nurse Midwife

## 2018-09-14 VITALS — BP 127/83 | HR 81 | Wt 174.7 lb

## 2018-09-14 DIAGNOSIS — O26893 Other specified pregnancy related conditions, third trimester: Secondary | ICD-10-CM | POA: Insufficient documentation

## 2018-09-14 DIAGNOSIS — Z3403 Encounter for supervision of normal first pregnancy, third trimester: Secondary | ICD-10-CM

## 2018-09-14 DIAGNOSIS — N898 Other specified noninflammatory disorders of vagina: Secondary | ICD-10-CM

## 2018-09-14 LAB — POCT URINALYSIS DIPSTICK OB
BILIRUBIN UA: NEGATIVE
Blood, UA: NEGATIVE
GLUCOSE, UA: NEGATIVE
KETONES UA: NEGATIVE
Leukocytes, UA: NEGATIVE
Nitrite, UA: NEGATIVE
SPEC GRAV UA: 1.01 (ref 1.010–1.025)
Urobilinogen, UA: 0.2 E.U./dL
pH, UA: 7.5 (ref 5.0–8.0)

## 2018-09-14 NOTE — Patient Instructions (Addendum)
WE WOULD LOVE TO HEAR FROM YOU!!!!   Thank you [name] for visiting [facility].  Providing our patients with the best experience possible is really important to [place], and we hope that you felt that on your recent visit. The most valuable feedback we get comes from YOU!!    If you receive a survey please take a couple of minutes to let us know how we did.Thank you for continuing to trust [place] with your care.   [facility]   Premature Rupture and Preterm Premature Rupture of Membranes  A sac made up of membranes surrounds your baby in the womb (uterus). Rupture of membranes is when this sac breaks open. This is also known as your "water breaking." When this sac breaks before labor starts, it is called premature rupture of membranes (PROM). If this happens before 37 weeks of being pregnant, it is called preterm premature rupture of membranes (PPROM). PPROM is serious. It needs medical care right away. What increases the risk of PPROM? PPROM is more likely to happen in women who:  Have an infection.  Have had PPROM before.  Have a cervix that is short.  Have bleeding during the second or third trimester.  Have a low BMI. This is a measure of body fat.  Smoke.  Use drugs.  Have a low socioeconomic status. What problems can be caused by PROM and PPROM? This condition creates health dangers for the mother and the baby. These include:  Giving birth to the baby too early (prematurely).  Getting a serious infection of the placenta (chorioamnionitis).  Having the placenta detach from the uterus early (placental abruption).  Squeezing of the umbilical cord.  Getting a serious infection after delivery. What are the signs of PROM and PPROM?  A sudden gush of fluid from the vagina.  A slow leak of fluid from the vagina.  Your underwear is wet. What should I do if I think my water broke? Call your doctor right away. You will need to  go to the hospital to get checked right away. What happens if I am told that I have PROM or PPROM? You will have tests done at the hospital.  If you have PROM, you may be given medicine to start labor (be induced). This may be done if you are not having contractions during the 24 hours after your water broke.  If you have PPROM and are not having contractions, you may be given medicine to start labor. It will depend on how far along you are in your pregnancy. If you have PPROM:  You and your baby will be watched closely to see if you have infections or other problems.  You may be given: ? An antibiotic medicine. This can stop an infection from starting. ? A steroid medicine. This can help your baby's lungs develop faster. ? A medicine to help prevent cerebral palsy in your baby. ? A medicine to stop early labor (preterm labor).  You may be told to stay in bed except to use the bathroom (bed rest).  You may be given medicine to start labor. This may be done if there are problems with you or the baby. Your treatment will depend on many factors. Contact a doctor if:  Your water breaks and you are not having contractions. Get help right away if:  Your water breaks before you are [redacted] weeks pregnant. Summary  When your water breaks before labor starts, it is called premature rupture of  membranes (PROM).  When PROM happens before 37 weeks of pregnancy, it is called preterm premature rupture of membranes (PPROM).  If you are not having contractions, your labor may be started for you. This information is not intended to replace advice given to you by your health care provider. Make sure you discuss any questions you have with your health care provider. Document Released: 10/14/2008 Document Revised: 03/03/2017 Document Reviewed: 04/07/2016 Elsevier Interactive Patient Education  2019 [employer] Preterm Labor and Birth Information Pregnancy normally lasts 39-41 weeks. Preterm labor is  when labor starts early. It starts before you have been pregnant for 37 whole weeks. What are the risk factors for preterm labor? Preterm labor is more likely to occur in women who:  Have an infection while pregnant.  Have a cervix that is short.  Have gone into preterm labor before.  Have had surgery on their cervix.  Are younger than age 23.  Are older than age 23.  Are African American.  Are pregnant with two or more babies.  Take street drugs while pregnant.  Smoke while pregnant.  Do not gain enough weight while pregnant.  Got pregnant right after another pregnancy. What are the symptoms of preterm labor? Symptoms of preterm labor include:  Cramps. The cramps may feel like the cramps some women get during their period. The cramps may happen with watery poop (diarrhea).  Pain in the belly (abdomen).  Pain in the lower back.  Regular contractions or tightening. It may feel like your belly is getting tighter.  Pressure in the lower belly that seems to get stronger.  More fluid (discharge) leaking from the vagina. The fluid may be watery or bloody.  Water breaking. Why is it important to notice signs of preterm labor? Babies who are born early may not be fully developed. They have a higher chance for:  Long-term heart problems.  Long-term lung problems.  Trouble controlling body systems, like breathing.  Bleeding in the brain.  A condition called cerebral palsy.  Learning difficulties.  Death. These risks are highest for babies who are born before 34 weeks of pregnancy. How is preterm labor treated? Treatment depends on:  How long you were pregnant.  Your condition.  The health of your baby. Treatment may involve:  Having a stitch (suture) placed in your cervix. When you give birth, your cervix opens so the baby can come out. The stitch keeps the cervix from opening too soon.  Staying at the hospital.  Taking or getting medicines, such  as: ? Hormone medicines. ? Medicines to stop contractions. ? Medicines to help the baby's lungs develop. ? Medicines to prevent your baby from having cerebral palsy. What should I do if I am in preterm labor? If you think you are going into labor too soon, call your doctor right away. How can I prevent preterm labor?  Do not use any tobacco products. ? Examples of these are cigarettes, chewing tobacco, and e-cigarettes. ? If you need help quitting, ask your doctor.  Do not use street drugs.  Do not use any medicines unless you ask your doctor if they are safe for you.  Talk with your doctor before taking any herbal supplements.  Make sure you gain enough weight.  Watch for infection. If you think you might have an infection, get it checked right away.  If you have gone into preterm labor before, tell your doctor. This information is not intended to replace advice given to you by your health  care provider. Make sure you discuss any questions you have with your health care provider. Document Released: 10/14/2008 Document Revised: 12/29/2015 Document Reviewed: 12/09/2015 Elsevier Interactive Patient Education  2019 [employer] Inc. Vaginal Delivery  Vaginal delivery means that you give birth by pushing your baby out of your birth canal (vagina). A team of health care providers will help you before, during, and after vaginal delivery. Birth experiences are unique for every woman and every pregnancy, and birth experiences vary depending on where you choose to give birth. What happens when I arrive at the birth center or hospital? Once you are in labor and have been admitted into the hospital or birth center, your health care provider may:  Review your pregnancy history and any concerns that you have.  Insert an IV into one of your veins. This may be used to give you fluids and medicines.  Check your blood pressure, pulse, temperature, and heart rate (vital signs).  Check whether your  bag of water (amniotic sac) has broken (ruptured).  Talk with you about your birth plan and discuss pain control options. Monitoring Your health care provider may monitor your contractions (uterine monitoring) and your baby's heart rate (fetal monitoring). You may need to be monitored:  Often, but not continuously (intermittently).  All the time or for long periods at a time (continuously). Continuous monitoring may be needed if: ? You are taking certain medicines, such as medicine to relieve pain or make your contractions stronger. ? You have pregnancy or labor complications. Monitoring may be done by:  Placing a special stethoscope or a handheld monitoring device on your abdomen to check your baby's heartbeat and to check for contractions.  Placing monitors on your abdomen (external monitors) to record your baby's heartbeat and the frequency and length of contractions.  Placing monitors inside your uterus through your vagina (internal monitors) to record your baby's heartbeat and the frequency, length, and strength of your contractions. Depending on the type of monitor, it may remain in your uterus or on your baby's head until birth.  Telemetry. This is a type of continuous monitoring that can be done with external or internal monitors. Instead of having to stay in bed, you are able to move around during telemetry. Physical exam Your health care provider may perform frequent physical exams. This may include:  Checking how and where your baby is positioned in your uterus.  Checking your cervix to determine: ? Whether it is thinning out (effacing). ? Whether it is opening up (dilating). What happens during labor and delivery?  Normal labor and delivery is divided into the following three stages: Stage 1  This is the longest stage of labor.  This stage can last for hours or days.  Throughout this stage, you will feel contractions. Contractions generally feel mild, infrequent, and  irregular at first. They get stronger, more frequent (about every 2-3 minutes), and more regular as you move through this stage.  This stage ends when your cervix is completely dilated to 4 inches (10 cm) and completely effaced. Stage 2  This stage starts once your cervix is completely effaced and dilated and lasts until the delivery of your baby.  This stage may last from 20 minutes to 2 hours.  This is the stage where you will feel an urge to push your baby out of your vagina.  You may feel stretching and burning pain, especially when the widest part of your baby's head passes through the vaginal opening (crowning).  Once your  baby is delivered, the umbilical cord will be clamped and cut. This usually occurs after waiting a period of 1-2 minutes after delivery.  Your baby will be placed on your bare chest (skin-to-skin contact) in an upright position and covered with a warm blanket. Watch your baby for feeding cues, like rooting or sucking, and help the baby to your breast for his or her first feeding. Stage 3  This stage starts immediately after the birth of your baby and ends after you deliver the placenta.  This stage may take anywhere from 5 to 30 minutes.  After your baby has been delivered, you will feel contractions as your body expels the placenta and your uterus contracts to control bleeding. What can I expect after labor and delivery?  After labor is over, you and your baby will be monitored closely until you are ready to go home to ensure that you are both healthy. Your health care team will teach you how to care for yourself and your baby.  You and your baby will stay in the same room (rooming in) during your hospital stay. This will encourage early bonding and successful breastfeeding.  You may continue to receive fluids and medicines through an IV.  Your uterus will be checked and massaged regularly (fundal massage).  You will have some soreness and pain in your  abdomen, vagina, and the area of skin between your vaginal opening and your anus (perineum).  If an incision was made near your vagina (episiotomy) or if you had some vaginal tearing during delivery, cold compresses may be placed on your episiotomy or your tear. This helps to reduce pain and swelling.  You may be given a squirt bottle to use instead of wiping when you go to the bathroom. To use the squirt bottle, follow these steps: ? Before you urinate, fill the squirt bottle with warm water. Do not use hot water. ? After you urinate, while you are sitting on the toilet, use the squirt bottle to rinse the area around your urethra and vaginal opening. This rinses away any urine and blood. ? Fill the squirt bottle with clean water every time you use the bathroom.  It is normal to have vaginal bleeding after delivery. Wear a sanitary pad for vaginal bleeding and discharge. Summary  Vaginal delivery means that you will give birth by pushing your baby out of your birth canal (vagina).  Your health care provider may monitor your contractions (uterine monitoring) and your baby's heart rate (fetal monitoring).  Your health care provider may perform a physical exam.  Normal labor and delivery is divided into three stages.  After labor is over, you and your baby will be monitored closely until you are ready to go home. This information is not intended to replace advice given to you by your health care provider. Make sure you discuss any questions you have with your health care provider. Document Released: 04/26/2008 Document Revised: 08/22/2017 Document Reviewed: 08/22/2017 Elsevier Interactive Patient Education  2019 [employer].

## 2018-09-14 NOTE — Progress Notes (Signed)
ROB-Reports intermittent leakage of fluid since last night. First noticed after getting out of shower. Last intercourse this morning. Speculum exam negative for pooling, fern negative, nitrazine positive-likely semen. Precautions given. Encouraged to place pad and report to [facility] for continued leakage. Anticipatory guidance regarding course of prenatal care. Reviewed red flag symptoms and when to call. RTC x 2 weeks for ROB or sooner if needed.

## 2018-09-14 NOTE — Progress Notes (Signed)
ROB-Patient c/o constant pelvic and lower back pressure x2 days.  Patient felt gush going down leg yesterday after getting out of the shower.

## 2018-09-17 ENCOUNTER — Encounter: Payer: Self-pay | Admitting: Certified Nurse Midwife

## 2018-09-18 LAB — CERVICOVAGINAL ANCILLARY ONLY
BACTERIAL VAGINITIS: NEGATIVE
CANDIDA VAGINITIS: NEGATIVE

## 2018-09-20 ENCOUNTER — Encounter: Payer: Self-pay | Admitting: Certified Nurse Midwife

## 2018-09-25 ENCOUNTER — Encounter: Payer: Self-pay | Admitting: *Deleted

## 2018-09-28 ENCOUNTER — Ambulatory Visit (INDEPENDENT_AMBULATORY_CARE_PROVIDER_SITE_OTHER): Payer: Medicaid Other | Admitting: Certified Nurse Midwife

## 2018-09-28 ENCOUNTER — Encounter: Payer: Self-pay | Admitting: Certified Nurse Midwife

## 2018-09-28 VITALS — BP 135/84 | HR 88 | Wt 182.1 lb

## 2018-09-28 DIAGNOSIS — Z3403 Encounter for supervision of normal first pregnancy, third trimester: Secondary | ICD-10-CM | POA: Diagnosis not present

## 2018-09-28 LAB — POCT URINALYSIS DIPSTICK OB
Bilirubin, UA: NEGATIVE
Blood, UA: NEGATIVE
GLUCOSE, UA: NEGATIVE
Ketones, UA: NEGATIVE
Nitrite, UA: NEGATIVE
SPEC GRAV UA: 1.015 (ref 1.010–1.025)
Urobilinogen, UA: 0.2 E.U./dL
pH, UA: 8.5 — AB (ref 5.0–8.0)

## 2018-09-28 NOTE — Patient Instructions (Signed)
Group B Streptococcus Infection During Pregnancy  Group B Streptococcus (GBS) is a type of bacteria (Streptococcus agalactiae) that is often found in healthy people, commonly in the rectum, vagina, and intestines. In people who are healthy and not pregnant, the bacteria rarely cause serious illness or complications. However, women who test positive for GBS during pregnancy can pass the bacteria to their baby during childbirth, which can cause serious infection in the baby after birth. Women with GBS may also have infections during their pregnancy or immediately after childbirth, such as such as urinary tract infections (UTIs) or infections of the uterus (uterine infections). Having GBS also increases a woman's risk of complications during pregnancy, such as early (preterm) labor or delivery, miscarriage, or stillbirth. Routine testing (screening) for GBS is recommended for all pregnant women. What increases the risk? You may have a higher risk for GBS infection during pregnancy if you had one during a past pregnancy. What are the signs or symptoms? In most cases, GBS infection does not cause symptoms in pregnant women. Signs and symptoms of a possible GBS-related infection may include:  Labor starting before the 37th week of pregnancy.  A UTI or bladder infection, which may cause: ? Fever. ? Pain or burning during urination. ? Frequent urination.  Fever during labor, along with: ? Bad-smelling discharge. ? Uterine tenderness. ? Rapid heartbeat in the mother, baby, or both. Rare but serious symptoms of a possible GBS-related infection in women include:  Blood infection (septicemia). This may cause fever, chills, or confusion.  Lung infection (pneumonia). This may cause fever, chills, cough, rapid breathing, difficulty breathing, or chest pain.  Bone, joint, skin, or soft tissue infection. How is this diagnosed? You may be screened for GBS between week 35 and week 37 of your pregnancy. If  you have symptoms of preterm labor, you may be screened earlier. This condition is diagnosed based on lab test results from:  A swab of fluid from the vagina and rectum.  A urine sample. How is this treated? This condition is treated with antibiotic medicine. When you go into labor, or as soon as your water breaks (your membranes rupture), you will be given antibiotics through an IV tube. Antibiotics will continue until after you give birth. If you are having a cesarean delivery, you do not need antibiotics unless your membranes have already ruptured. Follow these instructions at home:  Take over-the-counter and prescription medicines only as told by your health care provider.  Take your antibiotic medicine as told by your health care provider. Do not stop taking the antibiotic even if you start to feel better.  Keep all pre-birth (prenatal) visits and follow-up visits as told by your health care provider. This is important. Contact a health care provider if:  You have pain or burning when you urinate.  You have to urinate frequently.  You have a fever or chills.  You develop a bad-smelling vaginal discharge. Get help right away if:  Your membranes rupture.  You go into labor.  You have severe pain in your abdomen.  You have difficulty breathing.  You have chest pain. This information is not intended to replace advice given to you by your health care provider. Make sure you discuss any questions you have with your health care provider. Document Released: 10/25/2007 Document Revised: 02/12/2016 Document Reviewed: 02/11/2016 Elsevier Interactive Patient Education  2019 Elsevier Inc.  

## 2018-09-28 NOTE — Progress Notes (Signed)
ROB doing well. Feels good movement. Complains of swelling , and occasional floaters. Denies epigastric pain, and headaches. Reflexes 2+ bilaterally, swelling 2 + bilaterally, negative clonus. PIH baseline labs today. Will follow up with results. Return in 2 wks or sooner as needed.   [name], CNM

## 2018-09-29 LAB — CBC WITH DIFFERENTIAL/PLATELET
BASOS: 0 %
Basophils Absolute: 0 10*3/uL (ref 0.0–0.2)
EOS (ABSOLUTE): 0.1 10*3/uL (ref 0.0–0.4)
EOS: 0 %
HEMATOCRIT: 26.8 % — AB (ref 34.0–46.6)
Hemoglobin: 9.2 g/dL — ABNORMAL LOW (ref 11.1–15.9)
IMMATURE GRANULOCYTES: 3 %
Immature Grans (Abs): 0.4 10*3/uL — ABNORMAL HIGH (ref 0.0–0.1)
LYMPHS ABS: 1.2 10*3/uL (ref 0.7–3.1)
Lymphs: 8 %
MCH: 28.4 pg (ref 26.6–33.0)
MCHC: 34.3 g/dL (ref 31.5–35.7)
MCV: 83 fL (ref 79–97)
MONOS ABS: 0.8 10*3/uL (ref 0.1–0.9)
Monocytes: 5 %
Neutrophils Absolute: 11.9 10*3/uL — ABNORMAL HIGH (ref 1.4–7.0)
Neutrophils: 84 %
Platelets: 159 10*3/uL (ref 150–450)
RBC: 3.24 x10E6/uL — ABNORMAL LOW (ref 3.77–5.28)
RDW: 12.9 % (ref 11.7–15.4)
WBC: 14.3 10*3/uL — ABNORMAL HIGH (ref 3.4–10.8)

## 2018-09-29 LAB — COMPREHENSIVE METABOLIC PANEL
ALT: 7 IU/L (ref 0–32)
AST: 16 IU/L (ref 0–40)
Albumin/Globulin Ratio: 1.5 (ref 1.2–2.2)
Albumin: 3.2 g/dL — ABNORMAL LOW (ref 3.9–5.0)
Alkaline Phosphatase: 119 IU/L — ABNORMAL HIGH (ref 39–117)
BUN/Creatinine Ratio: 8 — ABNORMAL LOW (ref 9–23)
BUN: 5 mg/dL — ABNORMAL LOW (ref 6–20)
Bilirubin Total: 0.3 mg/dL (ref 0.0–1.2)
CALCIUM: 8.9 mg/dL (ref 8.7–10.2)
CO2: 20 mmol/L (ref 20–29)
Chloride: 106 mmol/L (ref 96–106)
Creatinine, Ser: 0.62 mg/dL (ref 0.57–1.00)
GFR, EST AFRICAN AMERICAN: 148 mL/min/{1.73_m2} (ref >59)
GFR, EST NON AFRICAN AMERICAN: 128 mL/min/{1.73_m2} (ref >59)
Globulin, Total: 2.2 g/dL (ref 1.5–4.5)
Glucose: 76 mg/dL (ref 65–99)
Potassium: 3.7 mmol/L (ref 3.5–5.2)
Sodium: 139 mmol/L (ref 134–144)
Total Protein: 5.4 g/dL — ABNORMAL LOW (ref 6.0–8.5)

## 2018-09-29 LAB — PROTEIN / CREATININE RATIO, URINE
Creatinine, Urine: 96.6 mg/dL
PROTEIN UR: 21.7 mg/dL
Protein/Creat Ratio: 225 mg/g creat — ABNORMAL HIGH (ref 0–200)

## 2018-10-02 ENCOUNTER — Observation Stay
Admission: EM | Admit: 2018-10-02 | Discharge: 2018-10-02 | Disposition: A | Discharge status: Home / Self Care | Payer: Medicaid Other | Attending: Certified Nurse Midwife | Admitting: Certified Nurse Midwife

## 2018-10-02 ENCOUNTER — Other Ambulatory Visit: Payer: Self-pay

## 2018-10-02 DIAGNOSIS — O418X30 Other specified disorders of amniotic fluid and membranes, third trimester, not applicable or unspecified: Principal | ICD-10-CM | POA: Insufficient documentation

## 2018-10-02 DIAGNOSIS — O26893 Other specified pregnancy related conditions, third trimester: Secondary | ICD-10-CM

## 2018-10-02 DIAGNOSIS — F1291 Cannabis use, unspecified, in remission: Secondary | ICD-10-CM

## 2018-10-02 DIAGNOSIS — N898 Other specified noninflammatory disorders of vagina: Secondary | ICD-10-CM

## 2018-10-02 DIAGNOSIS — Z3A35 35 weeks gestation of pregnancy: Secondary | ICD-10-CM | POA: Diagnosis not present

## 2018-10-02 DIAGNOSIS — Z79899 Other long term (current) drug therapy: Secondary | ICD-10-CM | POA: Insufficient documentation

## 2018-10-02 DIAGNOSIS — Z87898 Personal history of other specified conditions: Secondary | ICD-10-CM

## 2018-10-02 LAB — ROM PLUS (ARMC ONLY): ROM PLUS: NEGATIVE

## 2018-10-02 NOTE — OB Triage Note (Signed)
Pt reports possible SROM at 1930, states clear fluids. Denies vaginal bleeding, possibly 5 contractions in past two hours.

## 2018-10-02 NOTE — OB Triage Note (Signed)
L&D OB Triage Note  SUBJECTIVE Tamara Bishop is a 23 y.o. G1P0000 female at [redacted]w[redacted]d, EDD Estimated Date of Delivery: 11/06/18 who presented to triage with complaints of leaking of fluid at 1930, clear . Denies bleeding , has occasional contractions. Feels good fetal movement.    OB History  Gravida Para Term Preterm AB Living  1 0 0 0 0 0  SAB TAB Ectopic Multiple Live Births  0 0 0 0 0    # Outcome Date GA Lbr Len/2nd Weight Sex Delivery Anes PTL Lv  1 Current             Medications Prior to Admission  Medication Sig Dispense Refill Last Dose  . Ferrous Sulfate (IRON) 325 (65 Fe) MG TABS Take 1 tablet by mouth daily.   10/02/2018 at Unknown time  . Prenatal Vit-Fe Fumarate-FA (PRENATAL MULTIVITAMIN) TABS tablet Take 1 tablet by mouth daily at 12 noon.   10/02/2018 at Unknown time  . ondansetron (ZOFRAN ODT) 4 MG disintegrating tablet Take 1 tablet (4 mg total) by mouth every 6 (six) hours as needed for nausea. (Patient not taking: Reported on 09/28/2018) 20 tablet 0 Not Taking     OBJECTIVE  Nursing Evaluation:   BP 138/85   Pulse 88   Ht 5\' 1"  (1.549 m)   Wt 81.6 kg   LMP 01/22/2018 (Approximate)   BMI 34.01 kg/m    Findings:  ROM plus:negative  NST was performed and has been reviewed by me.  NST INTERPRETATION: Category I  Mode: External Baseline Rate (A): 130 bpm Variability: Moderate Accelerations: 15 x 15 Decelerations: None     Contraction Frequency (min): 2-6  ASSESSMENT Impression:  1.  Pregnancy:  G1P0000 at [redacted]w[redacted]d , EDD Estimated Date of Delivery: 11/06/18 2.  NST:  Category I  PLAN 1. Reassurance given 2. Discharge home with standard labor precautions given to return to L&D or call the office for problems. 3. Continue routine prenatal care.  [name], CNM

## 2018-10-07 ENCOUNTER — Encounter: Payer: Self-pay | Admitting: Certified Nurse Midwife

## 2018-10-12 ENCOUNTER — Other Ambulatory Visit: Payer: Self-pay

## 2018-10-12 ENCOUNTER — Ambulatory Visit (INDEPENDENT_AMBULATORY_CARE_PROVIDER_SITE_OTHER): Payer: Medicaid Other | Admitting: Obstetrics and Gynecology

## 2018-10-12 VITALS — BP 128/86 | HR 70 | Wt 187.2 lb

## 2018-10-12 DIAGNOSIS — O99019 Anemia complicating pregnancy, unspecified trimester: Secondary | ICD-10-CM | POA: Diagnosis not present

## 2018-10-12 DIAGNOSIS — Z3403 Encounter for supervision of normal first pregnancy, third trimester: Secondary | ICD-10-CM | POA: Diagnosis not present

## 2018-10-12 DIAGNOSIS — O99013 Anemia complicating pregnancy, third trimester: Secondary | ICD-10-CM

## 2018-10-12 LAB — POCT URINALYSIS DIPSTICK OB
Bilirubin, UA: NEGATIVE
Glucose, UA: NEGATIVE
Ketones, UA: NEGATIVE
Leukocytes, UA: NEGATIVE
Nitrite, UA: NEGATIVE
RBC UA: NEGATIVE
Spec Grav, UA: <=1.005 — AB (ref 1.010–1.025)
Urobilinogen, UA: 0.2 E.U./dL
pH, UA: 6.5 (ref 5.0–8.0)

## 2018-10-12 LAB — OB RESULTS CONSOLE GC/CHLAMYDIA: Gonorrhea: NEGATIVE

## 2018-10-12 LAB — OB RESULTS CONSOLE GBS: GBS: NEGATIVE

## 2018-10-12 NOTE — Progress Notes (Signed)
ROB & cultures- doing well, repeated anemia labs, CULTURES OBTAINED. 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 UDS repeated, not had one since new OB labs. Plans FOB & stepmom as labor support. Discussed flu & Covid-19 visitor restrictions.

## 2018-10-12 NOTE — Patient Instructions (Signed)

## 2018-10-13 LAB — COMPREHENSIVE METABOLIC PANEL
A/G RATIO: 1.2 (ref 1.2–2.2)
ALT: 5 IU/L (ref 0–32)
AST: 14 IU/L (ref 0–40)
Albumin: 3.1 g/dL — ABNORMAL LOW (ref 3.9–5.0)
Alkaline Phosphatase: 146 IU/L — ABNORMAL HIGH (ref 39–117)
BUN / CREAT RATIO: 10 (ref 9–23)
BUN: 7 mg/dL (ref 6–20)
Bilirubin Total: 0.2 mg/dL (ref 0.0–1.2)
CO2: 20 mmol/L (ref 20–29)
Calcium: 8.4 mg/dL — ABNORMAL LOW (ref 8.7–10.2)
Chloride: 106 mmol/L (ref 96–106)
Creatinine, Ser: 0.72 mg/dL (ref 0.57–1.00)
GFR calc Af Amer: 137 mL/min/{1.73_m2} (ref >59)
GFR calc non Af Amer: 119 mL/min/{1.73_m2} (ref >59)
Globulin, Total: 2.5 g/dL (ref 1.5–4.5)
Glucose: 57 mg/dL — ABNORMAL LOW (ref 65–99)
POTASSIUM: 4.1 mmol/L (ref 3.5–5.2)
SODIUM: 141 mmol/L (ref 134–144)
Total Protein: 5.6 g/dL — ABNORMAL LOW (ref 6.0–8.5)

## 2018-10-13 LAB — B12 AND FOLATE PANEL
Folate: 8.1 ng/mL (ref >3.0)
VITAMIN B 12: 159 pg/mL — AB (ref 232–1245)

## 2018-10-13 LAB — CBC
Hematocrit: 29.7 % — ABNORMAL LOW (ref 34.0–46.6)
Hemoglobin: 9.5 g/dL — ABNORMAL LOW (ref 11.1–15.9)
MCH: 26.8 pg (ref 26.6–33.0)
MCHC: 32 g/dL (ref 31.5–35.7)
MCV: 84 fL (ref 79–97)
PLATELETS: 152 10*3/uL (ref 150–450)
RBC: 3.54 x10E6/uL — ABNORMAL LOW (ref 3.77–5.28)
RDW: 13.1 % (ref 11.7–15.4)
WBC: 10.2 10*3/uL (ref 3.4–10.8)

## 2018-10-13 LAB — PROTEIN / CREATININE RATIO, URINE
Creatinine, Urine: 197.9 mg/dL
Protein, Ur: 89.1 mg/dL
Protein/Creat Ratio: 450 mg/g creat — ABNORMAL HIGH (ref 0–200)

## 2018-10-13 LAB — VITAMIN D 25 HYDROXY (VIT D DEFICIENCY, FRACTURES): Vit D, 25-Hydroxy: 13.5 ng/mL — ABNORMAL LOW (ref 30.0–100.0)

## 2018-10-13 LAB — FERRITIN: Ferritin: 8 ng/mL — ABNORMAL LOW (ref 15–150)

## 2018-10-14 ENCOUNTER — Encounter: Payer: Self-pay | Admitting: Certified Nurse Midwife

## 2018-10-14 ENCOUNTER — Other Ambulatory Visit: Payer: Self-pay

## 2018-10-14 ENCOUNTER — Observation Stay
Admission: EM | Admit: 2018-10-14 | Discharge: 2018-10-14 | Disposition: A | Discharge status: Home / Self Care | Payer: Medicaid Other | Attending: Obstetrics and Gynecology | Admitting: Obstetrics and Gynecology

## 2018-10-14 DIAGNOSIS — Z3A37 37 weeks gestation of pregnancy: Secondary | ICD-10-CM | POA: Insufficient documentation

## 2018-10-14 DIAGNOSIS — Z3490 Encounter for supervision of normal pregnancy, unspecified, unspecified trimester: Secondary | ICD-10-CM

## 2018-10-14 DIAGNOSIS — O471 False labor at or after 37 completed weeks of gestation: Secondary | ICD-10-CM

## 2018-10-14 LAB — GLUCOSE, CAPILLARY: Glucose-Capillary: 85 mg/dL (ref 70–99)

## 2018-10-14 NOTE — OB Triage Note (Signed)
Pt reports CTX since 1600 10/14/2018.  No LOF or bleeding.  Pt reports normal fetal movement and feeling dizzy for the last few hours. Also reports nausea for the last 2 days and diarrhea for the past 3 days.  Pts feet and lower legs swollen +1 edema.

## 2018-10-16 ENCOUNTER — Inpatient Hospital Stay
Admission: EM | Admit: 2018-10-16 | Discharge: 2018-10-19 | DRG: 807 | Disposition: A | Discharge status: Home / Self Care | Payer: Medicaid Other | Attending: Certified Nurse Midwife | Admitting: Certified Nurse Midwife

## 2018-10-16 ENCOUNTER — Ambulatory Visit (INDEPENDENT_AMBULATORY_CARE_PROVIDER_SITE_OTHER): Payer: Medicaid Other | Admitting: Obstetrics and Gynecology

## 2018-10-16 ENCOUNTER — Other Ambulatory Visit: Payer: Self-pay | Admitting: Obstetrics and Gynecology

## 2018-10-16 ENCOUNTER — Other Ambulatory Visit: Payer: Self-pay

## 2018-10-16 VITALS — BP 136/84 | HR 72 | Temp 99.2°F

## 2018-10-16 DIAGNOSIS — O9902 Anemia complicating childbirth: Secondary | ICD-10-CM | POA: Diagnosis present

## 2018-10-16 DIAGNOSIS — Z87898 Personal history of other specified conditions: Secondary | ICD-10-CM

## 2018-10-16 DIAGNOSIS — O99019 Anemia complicating pregnancy, unspecified trimester: Secondary | ICD-10-CM | POA: Diagnosis present

## 2018-10-16 DIAGNOSIS — R05 Cough: Secondary | ICD-10-CM | POA: Diagnosis not present

## 2018-10-16 DIAGNOSIS — O1494 Unspecified pre-eclampsia, complicating childbirth: Principal | ICD-10-CM | POA: Diagnosis present

## 2018-10-16 DIAGNOSIS — Z3493 Encounter for supervision of normal pregnancy, unspecified, third trimester: Secondary | ICD-10-CM

## 2018-10-16 DIAGNOSIS — O1493 Unspecified pre-eclampsia, third trimester: Secondary | ICD-10-CM | POA: Diagnosis not present

## 2018-10-16 DIAGNOSIS — Z3A37 37 weeks gestation of pregnancy: Secondary | ICD-10-CM

## 2018-10-16 DIAGNOSIS — E538 Deficiency of other specified B group vitamins: Secondary | ICD-10-CM

## 2018-10-16 DIAGNOSIS — E559 Vitamin D deficiency, unspecified: Secondary | ICD-10-CM

## 2018-10-16 DIAGNOSIS — R51 Headache: Secondary | ICD-10-CM | POA: Diagnosis not present

## 2018-10-16 DIAGNOSIS — D649 Anemia, unspecified: Secondary | ICD-10-CM | POA: Diagnosis present

## 2018-10-16 DIAGNOSIS — O1490 Unspecified pre-eclampsia, unspecified trimester: Secondary | ICD-10-CM | POA: Diagnosis present

## 2018-10-16 DIAGNOSIS — J029 Acute pharyngitis, unspecified: Secondary | ICD-10-CM | POA: Diagnosis not present

## 2018-10-16 DIAGNOSIS — O163 Unspecified maternal hypertension, third trimester: Secondary | ICD-10-CM

## 2018-10-16 DIAGNOSIS — Z87891 Personal history of nicotine dependence: Secondary | ICD-10-CM | POA: Diagnosis not present

## 2018-10-16 DIAGNOSIS — F1291 Cannabis use, unspecified, in remission: Secondary | ICD-10-CM

## 2018-10-16 DIAGNOSIS — R6889 Other general symptoms and signs: Secondary | ICD-10-CM

## 2018-10-16 LAB — RESPIRATORY PANEL BY PCR
Adenovirus: NOT DETECTED
Bordetella pertussis: NOT DETECTED
Chlamydophila pneumoniae: NOT DETECTED
Coronavirus 229E: NOT DETECTED
Coronavirus HKU1: NOT DETECTED
Coronavirus NL63: NOT DETECTED
Coronavirus OC43: NOT DETECTED
Influenza A: NOT DETECTED
Influenza B: NOT DETECTED
METAPNEUMOVIRUS-RVPPCR: NOT DETECTED
Mycoplasma pneumoniae: NOT DETECTED
PARAINFLUENZA VIRUS 2-RVPPCR: NOT DETECTED
Parainfluenza Virus 1: NOT DETECTED
Parainfluenza Virus 3: NOT DETECTED
Parainfluenza Virus 4: NOT DETECTED
Respiratory Syncytial Virus: NOT DETECTED
Rhinovirus / Enterovirus: NOT DETECTED

## 2018-10-16 LAB — COMPREHENSIVE METABOLIC PANEL
ALBUMIN: 2.5 g/dL — AB (ref 3.5–5.0)
ALT: 8 U/L (ref 0–44)
AST: 16 U/L (ref 15–41)
Alkaline Phosphatase: 116 U/L (ref 38–126)
Anion gap: 7 (ref 5–15)
BUN: 8 mg/dL (ref 6–20)
CO2: 21 mmol/L — ABNORMAL LOW (ref 22–32)
Calcium: 8.1 mg/dL — ABNORMAL LOW (ref 8.9–10.3)
Chloride: 110 mmol/L (ref 98–111)
Creatinine, Ser: 0.81 mg/dL (ref 0.44–1.00)
GFR calc Af Amer: >60 mL/min (ref >60)
GFR calc non Af Amer: >60 mL/min (ref >60)
Glucose, Bld: 85 mg/dL (ref 70–99)
Potassium: 4 mmol/L (ref 3.5–5.1)
Sodium: 138 mmol/L (ref 135–145)
Total Bilirubin: 0.3 mg/dL (ref 0.3–1.2)
Total Protein: 5.4 g/dL — ABNORMAL LOW (ref 6.5–8.1)

## 2018-10-16 LAB — CBC
HCT: 28.4 % — ABNORMAL LOW (ref 36.0–46.0)
Hemoglobin: 9.1 g/dL — ABNORMAL LOW (ref 12.0–15.0)
MCH: 27.5 pg (ref 26.0–34.0)
MCHC: 32 g/dL (ref 30.0–36.0)
MCV: 85.8 fL (ref 80.0–100.0)
Platelets: 117 10*3/uL — ABNORMAL LOW (ref 150–400)
RBC: 3.31 MIL/uL — ABNORMAL LOW (ref 3.87–5.11)
RDW: 13.6 % (ref 11.5–15.5)
WBC: 11.8 10*3/uL — ABNORMAL HIGH (ref 4.0–10.5)
nRBC: 0 % (ref 0.0–0.2)

## 2018-10-16 LAB — POCT URINALYSIS DIPSTICK OB
Bilirubin, UA: NEGATIVE
Blood, UA: NEGATIVE
Glucose, UA: NEGATIVE
Ketones, UA: NEGATIVE
Nitrite, UA: NEGATIVE
Spec Grav, UA: 1.015 (ref 1.010–1.025)
Urobilinogen, UA: 0.2 E.U./dL
pH, UA: 7 (ref 5.0–8.0)

## 2018-10-16 LAB — POCT INFLUENZA A/B
Influenza A, POC: NEGATIVE
Influenza B, POC: NEGATIVE

## 2018-10-16 LAB — PROTEIN / CREATININE RATIO, URINE
Creatinine, Urine: 235 mg/dL
Protein Creatinine Ratio: 2.13 mg/mg{Cre} — ABNORMAL HIGH (ref 0.00–0.15)
Total Protein, Urine: 501 mg/dL

## 2018-10-16 LAB — TYPE AND SCREEN
ABO/RH(D): A POS
Antibody Screen: NEGATIVE

## 2018-10-16 LAB — STREP GP B CULTURE+RFLX: STREP GP B CULTURE+RFLX: NEGATIVE

## 2018-10-16 LAB — GC/CHLAMYDIA PROBE AMP
Chlamydia trachomatis, NAA: NEGATIVE
Neisseria gonorrhoeae by PCR: NEGATIVE

## 2018-10-16 MED ORDER — CYANOCOBALAMIN 1000 MCG/ML IJ SOLN: 1000.0000 ug | INTRAMUSCULAR | 1 refills | Status: DC

## 2018-10-16 MED ORDER — ACETAMINOPHEN 500 MG PO TABS
1000.0000 mg | ORAL_TABLET | Freq: Four times a day (QID) | ORAL | Status: DC | PRN
  Administered 2018-10-16: 1000 mg via ORAL
  Filled 2018-10-16: qty 2

## 2018-10-16 MED ORDER — ZOLPIDEM TARTRATE 5 MG PO TABS: 5.0000 mg | ORAL_TABLET | Freq: Every evening | ORAL | Status: DC | PRN

## 2018-10-16 MED ORDER — MAGNESIUM SULFATE BOLUS VIA INFUSION
4.0000 g | Freq: Once | INTRAVENOUS | Status: AC
  Administered 2018-10-16: 4 g via INTRAVENOUS
  Filled 2018-10-16: qty 500

## 2018-10-16 MED ORDER — LABETALOL HCL 5 MG/ML IV SOLN
40.0000 mg | INTRAVENOUS | Status: DC | PRN
  Filled 2018-10-16: qty 8

## 2018-10-16 MED ORDER — TERBUTALINE SULFATE 1 MG/ML IJ SOLN: 0.2500 mg | Freq: Once | INTRAMUSCULAR | Status: DC | PRN

## 2018-10-16 MED ORDER — VITAMIN D (ERGOCALCIFEROL) 1.25 MG (50000 UNIT) PO CAPS: 50000.0000 [IU] | ORAL_CAPSULE | ORAL | 1 refills | Status: DC

## 2018-10-16 MED ORDER — OXYTOCIN 10 UNIT/ML IJ SOLN
INTRAMUSCULAR | Status: AC
  Filled 2018-10-16: qty 2

## 2018-10-16 MED ORDER — SOD CITRATE-CITRIC ACID 500-334 MG/5ML PO SOLN: 30.0000 mL | ORAL | Status: DC | PRN

## 2018-10-16 MED ORDER — MAGNESIUM SULFATE 40 G IN LACTATED RINGERS - SIMPLE
2.0000 g/h | INTRAVENOUS | Status: DC
  Administered 2018-10-16 – 2018-10-18 (×2): 2 g/h via INTRAVENOUS
  Filled 2018-10-16: qty 500

## 2018-10-16 MED ORDER — LACTATED RINGERS IV SOLN: 500.0000 mL | INTRAVENOUS | Status: DC | PRN

## 2018-10-16 MED ORDER — OXYTOCIN 40 UNITS IN NORMAL SALINE INFUSION - SIMPLE MED
2.5000 [IU]/h | INTRAVENOUS | Status: DC
  Administered 2018-10-17: 4 [IU]/h via INTRAVENOUS
  Filled 2018-10-16 (×2): qty 1000

## 2018-10-16 MED ORDER — LIDOCAINE HCL (PF) 1 % IJ SOLN
30.0000 mL | INTRAMUSCULAR | Status: AC | PRN
  Administered 2018-10-17: 30 mL via SUBCUTANEOUS

## 2018-10-16 MED ORDER — MISOPROSTOL 200 MCG PO TABS
ORAL_TABLET | ORAL | Status: AC
  Administered 2018-10-16: 50 ug via VAGINAL
  Filled 2018-10-16: qty 4

## 2018-10-16 MED ORDER — AMMONIA AROMATIC IN INHA
RESPIRATORY_TRACT | Status: AC
  Filled 2018-10-16: qty 10

## 2018-10-16 MED ORDER — LACTATED RINGERS IV SOLN
INTRAVENOUS | Status: DC
  Administered 2018-10-16 – 2018-10-18 (×5): via INTRAVENOUS

## 2018-10-16 MED ORDER — LIDOCAINE HCL (PF) 1 % IJ SOLN
INTRAMUSCULAR | Status: AC
  Administered 2018-10-17: 30 mL via SUBCUTANEOUS
  Filled 2018-10-16: qty 30

## 2018-10-16 MED ORDER — OXYCODONE-ACETAMINOPHEN 5-325 MG PO TABS
2.0000 | ORAL_TABLET | ORAL | Status: DC | PRN
  Administered 2018-10-16: 2 via ORAL
  Filled 2018-10-16: qty 2

## 2018-10-16 MED ORDER — OXYTOCIN BOLUS FROM INFUSION
500.0000 mL | Freq: Once | INTRAVENOUS | Status: AC
  Administered 2018-10-17: 500 mL via INTRAVENOUS

## 2018-10-16 MED ORDER — METOCLOPRAMIDE HCL 5 MG/ML IJ SOLN
10.0000 mg | Freq: Four times a day (QID) | INTRAMUSCULAR | Status: DC | PRN
  Administered 2018-10-16: 10 mg via INTRAVENOUS
  Filled 2018-10-16 (×2): qty 2

## 2018-10-16 MED ORDER — MISOPROSTOL 50MCG HALF TABLET
50.0000 ug | ORAL_TABLET | ORAL | Status: DC
  Administered 2018-10-16 – 2018-10-17 (×3): 50 ug via VAGINAL
  Filled 2018-10-16 (×3): qty 1

## 2018-10-16 MED ORDER — BUTORPHANOL TARTRATE 2 MG/ML IJ SOLN
1.0000 mg | INTRAMUSCULAR | Status: DC | PRN
  Administered 2018-10-16: 1 mg via INTRAVENOUS
  Filled 2018-10-16: qty 1

## 2018-10-16 MED ORDER — CITRANATAL BLOOM 90-1 MG PO TABS: 1.0000 | ORAL_TABLET | Freq: Every day | ORAL | 11 refills | Status: DC

## 2018-10-16 MED ORDER — FLUTICASONE PROPIONATE 50 MCG/ACT NA SUSP
2.0000 | Freq: Every day | NASAL | Status: DC
  Filled 2018-10-16: qty 16

## 2018-10-16 MED ORDER — CALCIUM CARBONATE ANTACID 500 MG PO CHEW
400.0000 mg | CHEWABLE_TABLET | Freq: Three times a day (TID) | ORAL | Status: DC
  Administered 2018-10-16 – 2018-10-19 (×7): 400 mg via ORAL
  Filled 2018-10-16 (×6): qty 2

## 2018-10-16 MED ORDER — OXYCODONE-ACETAMINOPHEN 5-325 MG PO TABS
1.0000 | ORAL_TABLET | ORAL | Status: DC | PRN
  Administered 2018-10-17: 1 via ORAL
  Filled 2018-10-16: qty 1

## 2018-10-16 MED ORDER — ONDANSETRON HCL 4 MG/2ML IJ SOLN
4.0000 mg | Freq: Four times a day (QID) | INTRAMUSCULAR | Status: DC | PRN
  Filled 2018-10-16: qty 2

## 2018-10-16 MED ORDER — NIFEDIPINE 10 MG PO CAPS
20.0000 mg | ORAL_CAPSULE | ORAL | Status: DC | PRN
  Administered 2018-10-16: 20 mg via ORAL

## 2018-10-16 MED ORDER — NIFEDIPINE 10 MG PO CAPS
20.0000 mg | ORAL_CAPSULE | ORAL | Status: DC | PRN
  Filled 2018-10-16: qty 2

## 2018-10-16 MED ORDER — OXYTOCIN 40 UNITS IN NORMAL SALINE INFUSION - SIMPLE MED
1.0000 m[IU]/min | INTRAVENOUS | Status: DC
  Administered 2018-10-17: 2 m[IU]/min via INTRAVENOUS

## 2018-10-16 MED ORDER — NIFEDIPINE 10 MG PO CAPS
10.0000 mg | ORAL_CAPSULE | ORAL | Status: DC | PRN
  Administered 2018-10-16 – 2018-10-18 (×5): 10 mg via ORAL
  Filled 2018-10-16 (×5): qty 1

## 2018-10-16 NOTE — Discharge Summary (Signed)
L&D OB Triage Note  Tamara Bishop is a 23 y.o. G1P0000 female at [redacted]w[redacted]d, EDD Estimated Date of Delivery: 11/06/18 who presented to triage for complaints of irregular contractions for the last 4 hours, with dizziness. Also reports diarrhea  For the last two days.  She was evaluated by the nurses with no significant findings/findings significant for labor. Also BP was stable, does have 2+ edema.. Vital signs stable. An NST was performed and has been reviewed by me. NST INTERPRETATION: Indications: rule out uterine contractions  Mode: External Baseline Rate (A): 125 bpm Variability: Moderate Accelerations: 15 x 15 Decelerations: None     Contraction Frequency (min): UI  Impression: reactive   Plan: NST performed was reviewed and was found to be reactive. She was discharged home with bleeding/labor precautions.  Continue routine prenatal care. Follow up with OB/GYN as previously scheduled.     Raahil Ong Suzan Nailer, CNM

## 2018-10-16 NOTE — Progress Notes (Signed)
Work in [employer]- here with c/o headaches, sore throat and cough for the last 3-4 days that has been getting worse in the last 24 hours. Has been taking tylenol cold and sinus. 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 States there have been a lot of people at work sick. Rapid Flu negative, negative lymphadenopathy. Throat slightly red with clear postnasal drainage. Lungs clear bilaterally with dry cough noted.  2+ reflexes, negative clonus, 2+ pitting edema with compression socks on. FM noted. Denies vision changes, but has some dizziness.  Will send to [facility] for PIH workup, IP notified of symptoms and negative Flu test.  Out of work for remainder of pregnancy.

## 2018-10-16 NOTE — Progress Notes (Signed)
Called patient to review labs, and she states she has been getting sick and feeling worse over last 3-4 days. Started as sore throat and cough, now with fever and worsening cough. Does work at [employer] so possible exposure to Flu. Will come in for flu shot, BP check, and B12 injection today.

## 2018-10-16 NOTE — Progress Notes (Signed)
Patient ID: Tamara Bishop, female   DOB: [birth year], 23 y.o.   MRN: [id number]  Telephone received from Ronny Bacon from infection control. Patient symptoms and status reviewed. Will proceed with respiratory panel. Does not meet criteria for new COVID testing. May downgrade to droplet precautions only.    Late entry due to patient care activities.    Gunnar Bulla, CNM [facility], [facility] 10/16/18 10:49 PM

## 2018-10-16 NOTE — Progress Notes (Signed)
OB WORK IN-

## 2018-10-16 NOTE — Progress Notes (Addendum)
Patient ID: Tamara Bishop, female   DOB: June 22, 1996, 23 y.o.   MRN: [id number]  Tamara Bishop is a 23 y.o. G1P0000 at [redacted]w[redacted]d by ultrasound admitted for induction of labor due to Pre-eclampsia.  Subjective:  Patient reports resolving symptoms except for 5-6/10 posterior headache. No blurred vision or epigastric pain.   Denies difficulty breathing or respiratory distress, chest pain, abdominal pain, vaginal bleeding, dysuria, and leg pain or swelling.   Family members at bedside for continuous labor support.   Objective:  Temp:  [98.1 F (36.7 C)-99.2 F (37.3 C)] 98.7 F (37.1 C) (03/17 1937) Pulse Rate:  [55-101] 78 (03/17 2137) Resp:  [16-18] 18 (03/17 1937) BP: (123-169)/(79-100) 153/100 (03/17 2137) SpO2:  [98 %-100 %] 98 % (03/17 1425) Weight:  [84.8 kg] 84.8 kg (03/17 1342)   Intake/Output Summary (Last 24 hours) at 10/16/2018 2330 Last data filed at 10/16/2018 1632 Gross per 24 hour  Intake 0 ml  Output -  Net 0 ml    Fetal Wellbeing:  Category I  UC:   regular, every two (2) to six (6) minutes; soft resting tone  SVE:   Dilation: 2 Effacement (%): 50 Station: -2 Exam by:: S.Apel, RNC  Labs: Lab Results  Component Value Date   WBC 11.8 (H) 10/16/2018   HGB 9.1 (L) 10/16/2018   HCT 28.4 (L) 10/16/2018   MCV 85.8 10/16/2018   PLT 117 (L) 10/16/2018    Assessment:  Tamara Bishop is a 23 y.o. G1P0000 at [redacted]w[redacted]d admitted for induction of labor due to pre-eclampsia, Rh positive, GBS negative, History of anxiety and depression, History of marijuana use, Vitamin D Deficiency, B-12 Deficiency, Anemia in pregnancy, Cytotec induction  FHR Category I  Plan:  Respiratory panel negative, findings reviewed with patient and family members.   Telephone call to Thurmond Butts of Infection Prevention to discusses concerns from anesthesiology regarding epidural placement in a patient under droplet precautions. Pager number given for Dr. Algis Liming. Per Dr.  Algis Liming, patient may have epidural in labor if desired.   Rx: Reglan for nausea/headache, see orders. RN will given with percocet, see orders.   Reviewed red flag symptoms and when to call.   Continue orders as written. Reassess as needed.    Gunnar Bulla, CNM [facility], [facility] 10/16/2018, 11:30 PM

## 2018-10-16 NOTE — Addendum Note (Signed)
Addended by: Rosine Beat L on: 10/16/2018 02:33 PM   Modules accepted: Orders

## 2018-10-16 NOTE — H&P (Signed)
Obstetric History and Physical  Tamara Bishop is a 23 y.o. G1P0000 with IUP at [redacted]w[redacted]d presenting for evaluation from the office with cold symptoms, elevated blood pressure and headache. After assessment and completion of labs, she was diagnosed with pre-eclampsia.    Patient states she has been having  Irregular contractions, none vaginal bleeding, intact membranes, with active fetal movement.    Denies difficulty breathing or respiratory distress, chest pain, abdominal pain, dysuria, and leg pain.   Prenatal Course  Source of Care: [facility]-initial visit at 9 wks, total visits: 12  Pregnancy complications or risks: Pre-eclampsia, History of anxiety and depression, History of marijuana use, Vitamin D Deficiency, B-12 Deficiency   Prenatal labs and studies:  ABO, Rh: --/--/A POS (03/17 1548)  Antibody: NEG (03/17 1548)  Rubella: 1.27 (09/06 1533)  Varicella: 609 (09/06 1533)  RPR: Non Reactive (01/17 1128)   HBsAg: Negative (09/06 1533)   HIV: Non Reactive (09/06 1533)   UTM:LYYTKPTW (03/13 0000)  1 hr Glucola: 105 (01/17 1128)  Genetic screening: Low risk female (09/06 1533)  Anatomy US: Complete, normal (12/04 1421)  Past Medical History:  Diagnosis Date  . Adjustment disorder with anxiety   . Allergic rhinitis   . Anxiety   . Cigarette smoker   . Depression   . Insomnia   . Tobacco abuse     Past Surgical History:  Procedure Laterality Date  . NO PAST SURGERIES    . WISDOM TOOTH EXTRACTION      OB History  Gravida Para Term Preterm AB Living  1 0 0 0 0 0  SAB TAB Ectopic Multiple Live Births  0 0 0 0      # Outcome Date GA Lbr Len/2nd Weight Sex Delivery Anes PTL Lv  1 Current             Social History   Socioeconomic History  . Marital status: Significant Other    Spouse name: Tamara Bishop  . Number of children: Not on file  . Years of education: Not on file  . Highest education level: Not on file  Occupational History  . Not on file   Social Needs  . Financial resource strain: Not hard at all  . Food insecurity:    Worry: Never true    Inability: Never true  . Transportation needs:    Medical: No    Non-medical: No  Tobacco Use  . Smoking status: Former Smoker    Packs/day: 0.50    Years: 1.00    Pack years: 0.50    Types: Cigarettes  . Smokeless tobacco: Never Used  Substance and Sexual Activity  . Alcohol use: No  . Drug use: No  . Sexual activity: Yes    Birth control/protection: Pill  Lifestyle  . Physical activity:    Days per week: 5 days    Minutes per session: Not on file  . Stress: Not at all  Relationships  . Social connections:    Talks on phone: More than three times a week    Gets together: More than three times a week    Attends religious service: More than 4 times per year    Active member of club or organization: Yes    Attends meetings of clubs or organizations: Never    Relationship status: Living with partner  Other Topics Concern  . Not on file  Social History Narrative  . Not on file    Family History  Problem Relation Age of  Onset  . Lupus Mother   . Diabetes Father   . Breast cancer Maternal Grandmother   . Cancer Maternal Grandmother         breast  . Diabetes Paternal Grandmother   . Diabetes Paternal Grandfather   . Lupus Sister   . Heart disease Maternal Grandfather     Medications Prior to Admission  Medication Sig Dispense Refill Last Dose  . cyanocobalamin (,VITAMIN B-12,) 1000 MCG/ML injection Inject 1 mL (1,000 mcg total) into the muscle every 30 (thirty) days. 10 mL 1 10/16/2018 at Unknown time  . Prenatal-DSS-FeCb-FeGl-FA (CITRANATAL BLOOM) 90-1 MG TABS Take 1 tablet by mouth daily. 30 tablet 11 10/15/2018 at Unknown time  . [START ON 10/18/2018] Vitamin D, Ergocalciferol, (DRISDOL) 1.25 MG (50000 UT) CAPS capsule Take 1 capsule (50,000 Units total) by mouth 2 (two) times a week. 30 capsule 1 10/15/2018 at Unknown time    Allergies  Allergen Reactions  .  Azithromycin Other (See Comments)    Felt like her stomach was going to come out of her body    Review of Systems: Negative except for what is mentioned in HPI.  Physical Exam:  Temp:  [98.1 F (36.7 C)-99.2 F (37.3 C)] 98.1 F (36.7 C) (03/17 1756) Pulse Rate:  [55-101] 86 (03/17 1737) Resp:  [16] 16 (03/17 1342) BP: (123-169)/(79-97) 153/92 (03/17 1737) SpO2:  [98 %-100 %] 98 % (03/17 1425) Weight:  [84.8 kg] 84.8 kg (03/17 1342)  GENERAL: Well-developed, well-nourished female in no acute distress.   LUNGS: Clear to auscultation bilaterally.   HEART: Regular rate and rhythm.  ABDOMEN: Soft, nontender, nondistended, gravid.  EXTREMITIES: Nontender, pitting edema, 2+ distal pulses.  Cervical Exam: Dilation: 1.5 Effacement (%): 50 Cervical Position: Middle Station: -2 Presentation: Vertex Exam by:: Carlyon Shadow RN  FHT:  Baseline rate 140 bpm   Variability moderate  Accelerations present   Decelerations none  Contractions: Every one (1) to four (4) minutes; soft resting   Pertinent Labs/Studies:   Results for orders placed or performed during the hospital encounter of 10/16/18 (from the past 24 hour(s))  Protein / creatinine ratio, urine     Status: Abnormal   Collection Time: 10/16/18  1:26 PM  Result Value Ref Range   Creatinine, Urine 235 mg/dL   Total Protein, Urine 501 mg/dL   Protein Creatinine Ratio 2.13 (H) 0.00 - 0.15 mg/mg[Cre]  Comprehensive metabolic panel     Status: Abnormal   Collection Time: 10/16/18  2:17 PM  Result Value Ref Range   Sodium 138 135 - 145 mmol/L   Potassium 4.0 3.5 - 5.1 mmol/L   Chloride 110 98 - 111 mmol/L   CO2 21 (L) 22 - 32 mmol/L   Glucose, Bld 85 70 - 99 mg/dL   BUN 8 6 - 20 mg/dL   Creatinine, Ser 1.61 0.44 - 1.00 mg/dL   Calcium 8.1 (L) 8.9 - 10.3 mg/dL   Total Protein 5.4 (L) 6.5 - 8.1 g/dL   Albumin 2.5 (L) 3.5 - 5.0 g/dL   AST 16 15 - 41 U/L   ALT 8 0 - 44 U/L   Alkaline Phosphatase 116 38 - 126 U/L   Total  Bilirubin 0.3 0.3 - 1.2 mg/dL   GFR calc non Af Amer >60 >60 mL/min   GFR calc Af Amer >60 >60 mL/min   Anion gap 7 5 - 15  CBC     Status: Abnormal   Collection Time: 10/16/18  2:17 PM  Result Value  Ref Range   WBC 11.8 (H) 4.0 - 10.5 K/uL   RBC 3.31 (L) 3.87 - 5.11 MIL/uL   Hemoglobin 9.1 (L) 12.0 - 15.0 g/dL   HCT 93.2 (L) 35.5 - 73.2 %   MCV 85.8 80.0 - 100.0 fL   MCH 27.5 26.0 - 34.0 pg   MCHC 32.0 30.0 - 36.0 g/dL   RDW 20.2 54.2 - 70.6 %   Platelets 117 (L) 150 - 400 K/uL   nRBC 0.0 0.0 - 0.2 %  Type and screen [facility]     Status: None   Collection Time: 10/16/18  3:48 PM  Result Value Ref Range   ABO/RH(D) A POS    Antibody Screen NEG    Sample Expiration      10/19/2018 Performed at [facility], [address] [place], [place] [address]     Assessment :  IREMIDE BELLAIRE is a 23 y.o. G1P0000 at [redacted]w[redacted]d being admitted for induction of labor due to pre-eclampsia, Rh positive, GBS negative, History of anxiety and depression, History of marijuana use, Vitamin D Deficiency, B-12 Deficiency, Anemia in pregnancy  FHR Category I  Plan:  Admit to birthing suites, see orders  Induction/Augmentation as needed, per protocol  Will start magnesium sulfate as seizure prophylaxis if headache continues  Delivery plan: Hopeful for vaginal delivery  Dr. Valentino Saxon notified of admission and plan of care   Gunnar Bulla, CNM [facility], [facility] 10/16/18 5:57 PM

## 2018-10-16 NOTE — OB Triage Note (Addendum)
Pt is a 22yo G1P0 at [redacted]w[redacted]d that was sent over from the office for [facility] workup. Pt denies LOF, VB, and states positive FM. Pt denies any other issues with this pregnancy. Pt state she has headaches that resolve with tylenol. Pt states she has occasional blurred vision and floaters but last episode was last night. Pt denies epigastric pain and has 2+ reflexes and no clonus on assessment. Initial BP 154/95 with pressure cycling q15 min. Lungs clear on auscultation. Pt also states she has had diarrhea for the past week. Pt is complaining of cold for the past 4 days with fever off and on. Initial BP 98.3 oral.  Will continue to monitor

## 2018-10-17 ENCOUNTER — Inpatient Hospital Stay: Payer: Medicaid Other | Admitting: Anesthesiology

## 2018-10-17 DIAGNOSIS — O1494 Unspecified pre-eclampsia, complicating childbirth: Principal | ICD-10-CM

## 2018-10-17 DIAGNOSIS — Z3A37 37 weeks gestation of pregnancy: Secondary | ICD-10-CM

## 2018-10-17 LAB — MAGNESIUM: Magnesium: 5.2 mg/dL — ABNORMAL HIGH (ref 1.7–2.4)

## 2018-10-17 LAB — CBC
HCT: 29.3 % — ABNORMAL LOW (ref 36.0–46.0)
HCT: 30.4 % — ABNORMAL LOW (ref 36.0–46.0)
HEMOGLOBIN: 9.8 g/dL — AB (ref 12.0–15.0)
Hemoglobin: 9.2 g/dL — ABNORMAL LOW (ref 12.0–15.0)
MCH: 27.3 pg (ref 26.0–34.0)
MCH: 27.5 pg (ref 26.0–34.0)
MCHC: 31.4 g/dL (ref 30.0–36.0)
MCHC: 32.2 g/dL (ref 30.0–36.0)
MCV: 86.9 fL (ref 80.0–100.0)
MCV: 85.4 fL (ref 80.0–100.0)
Platelets: 120 10*3/uL — ABNORMAL LOW (ref 150–400)
Platelets: 119 10*3/uL — ABNORMAL LOW (ref 150–400)
RBC: 3.37 MIL/uL — ABNORMAL LOW (ref 3.87–5.11)
RBC: 3.56 MIL/uL — ABNORMAL LOW (ref 3.87–5.11)
RDW: 13.8 % (ref 11.5–15.5)
RDW: 13.6 % (ref 11.5–15.5)
WBC: 12.6 10*3/uL — AB (ref 4.0–10.5)
WBC: 16.1 10*3/uL — ABNORMAL HIGH (ref 4.0–10.5)
nRBC: 0 % (ref 0.0–0.2)
nRBC: 0 % (ref 0.0–0.2)

## 2018-10-17 LAB — MONITOR DRUG PROFILE 14(MW)
Amphetamine Scrn, Ur: NEGATIVE ng/mL
BARBITURATE SCREEN URINE: NEGATIVE ng/mL
BENZODIAZEPINE SCREEN, URINE: NEGATIVE ng/mL
Buprenorphine, Urine: NEGATIVE ng/mL
CANNABINOIDS UR QL SCN: NEGATIVE ng/mL
Cocaine (Metab) Scrn, Ur: NEGATIVE ng/mL
Creatinine(Crt), U: 160 mg/dL (ref 20.0–300.0)
Fentanyl, Urine: NEGATIVE pg/mL
MEPERIDINE SCREEN, URINE: NEGATIVE ng/mL
Methadone Screen, Urine: NEGATIVE ng/mL
OXYCODONE+OXYMORPHONE UR QL SCN: NEGATIVE ng/mL
Opiate Scrn, Ur: NEGATIVE ng/mL
Ph of Urine: 7.3 (ref 4.5–8.9)
Phencyclidine Qn, Ur: NEGATIVE ng/mL
Propoxyphene Scrn, Ur: NEGATIVE ng/mL
SPECIFIC GRAVITY: 1.016
Tramadol Screen, Urine: NEGATIVE ng/mL

## 2018-10-17 LAB — COMPREHENSIVE METABOLIC PANEL
ALK PHOS: 145 U/L — AB (ref 38–126)
ALT: 9 U/L (ref 0–44)
ALT: 8 U/L (ref 0–44)
ANION GAP: 8 (ref 5–15)
AST: 18 U/L (ref 15–41)
AST: 17 U/L (ref 15–41)
Albumin: 2.5 g/dL — ABNORMAL LOW (ref 3.5–5.0)
Albumin: 2.5 g/dL — ABNORMAL LOW (ref 3.5–5.0)
Alkaline Phosphatase: 139 U/L — ABNORMAL HIGH (ref 38–126)
Anion gap: 7 (ref 5–15)
BUN: 10 mg/dL (ref 6–20)
BUN: 9 mg/dL (ref 6–20)
CO2: 21 mmol/L — ABNORMAL LOW (ref 22–32)
CO2: 21 mmol/L — ABNORMAL LOW (ref 22–32)
Calcium: 8 mg/dL — ABNORMAL LOW (ref 8.9–10.3)
Calcium: 7.2 mg/dL — ABNORMAL LOW (ref 8.9–10.3)
Chloride: 110 mmol/L (ref 98–111)
Chloride: 106 mmol/L (ref 98–111)
Creatinine, Ser: 0.76 mg/dL (ref 0.44–1.00)
Creatinine, Ser: 0.76 mg/dL (ref 0.44–1.00)
GFR calc Af Amer: >60 mL/min (ref >60)
GFR calc Af Amer: >60 mL/min (ref >60)
GFR calc non Af Amer: >60 mL/min (ref >60)
GFR calc non Af Amer: >60 mL/min (ref >60)
Glucose, Bld: 89 mg/dL (ref 70–99)
Glucose, Bld: 82 mg/dL (ref 70–99)
POTASSIUM: 3.7 mmol/L (ref 3.5–5.1)
Potassium: 3.7 mmol/L (ref 3.5–5.1)
SODIUM: 135 mmol/L (ref 135–145)
Sodium: 138 mmol/L (ref 135–145)
Total Bilirubin: 0.6 mg/dL (ref 0.3–1.2)
Total Bilirubin: 0.2 mg/dL — ABNORMAL LOW (ref 0.3–1.2)
Total Protein: 5.4 g/dL — ABNORMAL LOW (ref 6.5–8.1)
Total Protein: 5.4 g/dL — ABNORMAL LOW (ref 6.5–8.1)

## 2018-10-17 LAB — RPR: RPR Ser Ql: NONREACTIVE

## 2018-10-17 MED ORDER — FENTANYL 2.5 MCG/ML W/ROPIVACAINE 0.15% IN NS 100 ML EPIDURAL (ARMC)
EPIDURAL | Status: AC
  Filled 2018-10-17: qty 100

## 2018-10-17 MED ORDER — BENZOCAINE-MENTHOL 20-0.5 % EX AERO
1.0000 "application " | INHALATION_SPRAY | CUTANEOUS | Status: DC | PRN
  Administered 2018-10-18 – 2018-10-19 (×2): 1 via TOPICAL
  Filled 2018-10-17 (×2): qty 56

## 2018-10-17 MED ORDER — SENNOSIDES-DOCUSATE SODIUM 8.6-50 MG PO TABS
2.0000 | ORAL_TABLET | ORAL | Status: DC
  Administered 2018-10-19: 2 via ORAL
  Filled 2018-10-17: qty 2

## 2018-10-17 MED ORDER — ONDANSETRON HCL 4 MG PO TABS: 4.0000 mg | ORAL_TABLET | ORAL | Status: DC | PRN

## 2018-10-17 MED ORDER — MISOPROSTOL 200 MCG PO TABS
800.0000 ug | ORAL_TABLET | Freq: Once | ORAL | Status: AC
  Administered 2018-10-17: 800 ug via RECTAL

## 2018-10-17 MED ORDER — DIPHENHYDRAMINE HCL 50 MG/ML IJ SOLN
INTRAMUSCULAR | Status: AC
  Filled 2018-10-17: qty 1

## 2018-10-17 MED ORDER — LIDOCAINE-EPINEPHRINE (PF) 1.5 %-1:200000 IJ SOLN
INTRAMUSCULAR | Status: DC | PRN
  Administered 2018-10-17: 3 mL via EPIDURAL

## 2018-10-17 MED ORDER — BUPIVACAINE HCL (PF) 0.25 % IJ SOLN
INTRAMUSCULAR | Status: DC | PRN
  Administered 2018-10-17: 2 mL via EPIDURAL
  Administered 2018-10-17: 4 mL via EPIDURAL
  Administered 2018-10-17: 5 mL via EPIDURAL

## 2018-10-17 MED ORDER — DOCUSATE SODIUM 100 MG PO CAPS
100.0000 mg | ORAL_CAPSULE | Freq: Two times a day (BID) | ORAL | Status: DC
  Administered 2018-10-18 – 2018-10-19 (×3): 100 mg via ORAL
  Filled 2018-10-17 (×3): qty 1

## 2018-10-17 MED ORDER — IBUPROFEN 600 MG PO TABS
600.0000 mg | ORAL_TABLET | Freq: Four times a day (QID) | ORAL | Status: DC
  Administered 2018-10-18 – 2018-10-19 (×8): 600 mg via ORAL
  Filled 2018-10-17 (×8): qty 1

## 2018-10-17 MED ORDER — ONDANSETRON HCL 4 MG/2ML IJ SOLN
4.0000 mg | INTRAMUSCULAR | Status: DC | PRN
  Administered 2018-10-18: 4 mg via INTRAVENOUS

## 2018-10-17 MED ORDER — PRENATAL MULTIVITAMIN CH
1.0000 | ORAL_TABLET | Freq: Every day | ORAL | Status: DC
  Administered 2018-10-18 – 2018-10-19 (×2): 1 via ORAL
  Filled 2018-10-17 (×2): qty 1

## 2018-10-17 MED ORDER — LIDOCAINE HCL (PF) 1 % IJ SOLN
INTRAMUSCULAR | Status: DC | PRN
  Administered 2018-10-17: 3 mL via SUBCUTANEOUS

## 2018-10-17 MED ORDER — FERROUS SULFATE 325 (65 FE) MG PO TABS
325.0000 mg | ORAL_TABLET | Freq: Every day | ORAL | Status: DC
  Administered 2018-10-18 – 2018-10-19 (×2): 325 mg via ORAL
  Filled 2018-10-17 (×2): qty 1

## 2018-10-17 MED ORDER — SIMETHICONE 80 MG PO CHEW: 80.0000 mg | CHEWABLE_TABLET | ORAL | Status: DC | PRN

## 2018-10-17 MED ORDER — DIPHENHYDRAMINE HCL 25 MG PO CAPS
ORAL_CAPSULE | ORAL | Status: AC
  Filled 2018-10-17: qty 1

## 2018-10-17 MED ORDER — DIBUCAINE 1 % RE OINT: 1.0000 "application " | TOPICAL_OINTMENT | RECTAL | Status: DC | PRN

## 2018-10-17 MED ORDER — DIPHENHYDRAMINE HCL 50 MG/ML IJ SOLN
12.5000 mg | Freq: Once | INTRAMUSCULAR | Status: AC
  Administered 2018-10-17: 12.5 mg via INTRAVENOUS

## 2018-10-17 MED ORDER — WITCH HAZEL-GLYCERIN EX PADS: 1.0000 "application " | MEDICATED_PAD | CUTANEOUS | Status: DC | PRN

## 2018-10-17 MED ORDER — FENTANYL 2.5 MCG/ML W/ROPIVACAINE 0.15% IN NS 100 ML EPIDURAL (ARMC)
EPIDURAL | Status: DC | PRN
  Administered 2018-10-17: 12 mL/h via EPIDURAL

## 2018-10-17 MED ORDER — CALCIUM GLUCONATE 10 % IV SOLN
INTRAVENOUS | Status: AC
  Filled 2018-10-17: qty 10

## 2018-10-17 NOTE — Anesthesia Procedure Notes (Signed)
Epidural  Start time: 10/17/2018 12:57 PM End time: 10/17/2018 1:15 PM  Staffing Anesthesiologist: Jovita Gamma, MD Resident/CRNA: Irving Burton, CRNA Performed: resident/CRNA   Preanesthetic Checklist Completed: patient identified, site marked, surgical consent, pre-op evaluation, IV checked, risks and benefits discussed and monitors and equipment checked  Epidural Patient position: sitting Prep: Betadine and site prepped and draped Patient monitoring: heart rate, continuous pulse ox and blood pressure Approach: midline Location: L3-L4 Injection technique: LOR air  Needle:  Needle type: Tuohy  Needle gauge: 17 G Needle length: 9 cm Needle insertion depth: 7 cm Catheter type: closed end flexible Catheter size: 19 Gauge Catheter at skin depth: 12 cm Test dose: negative and 1.5% lidocaine with Epi 1:200 K  Assessment Events: blood not aspirated, injection not painful, no injection resistance, negative IV test and no paresthesia  Additional Notes Reason for block:procedure for pain

## 2018-10-17 NOTE — Progress Notes (Signed)
Dr Henrene Hawking on unit. Informed of pt desire for epidural with labor progression. Dr Henrene Hawking expressed concern with isolation. IP Thurmond Butts) called and IP on call MD Dr. Terri Piedra called and consult with Henrene Hawking and MD done via phone. Dr Terri Piedra approved epidural placement in consult with Dr Henrene Hawking

## 2018-10-17 NOTE — Progress Notes (Addendum)
Patient ID: Tamara Bishop, female   DOB: 13-Mar-1996, 23 y.o.   MRN: [id number]  Tamara Bishop is a 23 y.o. G1P0000 at [redacted]w[redacted]d by ultrasound admitted for induction of labor due to Pre-eclampsia  Subjective:  Patient sitting in bed, reports occasional contractions pain. Headache returning. No blurred vision or epigastric pain. Denies difficulty breathing or respiratory distress, chest pain, abdominal pain, vaginal bleeding, dysuria, and leg pain or swelling.   Patient's stepmother at bedside for continuous labor support.   Objective:  Temp:  [98.1 F (36.7 C)-99.2 F (37.3 C)] 98.3 F (36.8 C) (03/18 0038) Pulse Rate:  [55-101] 94 (03/18 0652) Resp:  [16-18] 18 (03/18 0038) BP: (123-169)/(79-109) 146/90 (03/18 0652) SpO2:  [98 %-100 %] 98 % (03/17 1425) Weight:  [84.8 kg] 84.8 kg (03/17 1342)  I/O last 3 completed shifts: In: 0  Out: 775 [Urine:775] No intake/output data recorded.  Fetal Wellbeing:  Category I   UC:   regular, every one (1) to seven (7) minutes; soft resting tone; pitocin infusing  SVE:   Dilation: 2.5 Effacement (%): 60 Station: -2 Exam by:: S.Apel, RNC  Labs: Lab Results  Component Value Date   WBC 12.6 (H) 10/17/2018   HGB 9.2 (L) 10/17/2018   HCT 29.3 (L) 10/17/2018   MCV 86.9 10/17/2018   PLT 120 (L) 10/17/2018    Assessment:  Tamara Bishop a 23 y.o.G1P0000 at [redacted]w[redacted]d admitted for induction of labor due to pre-eclampsia, Rh positive, GBS negative, History of anxiety and depression, History of marijuana use, Vitamin D Deficiency, B-12 Deficiency, Anemia in pregnancy, Cytotec induction  FHR Category I  Plan:  Continue orders as written.   Report given to oncoming CNM, [name].    Gunnar Bulla, CNM [facility], [facility] 10/17/2018, 7:06 AM

## 2018-10-17 NOTE — Progress Notes (Signed)
LABOR NOTE   Tamara Bishop 22 y.o.@ at [redacted]w[redacted]d Early latent labor.  SUBJECTIVE:  Uncomfortable feeling pressure  OBJECTIVE:  BP (!) 141/91   Pulse (!) 104   Temp 98.6 F (37 C) (Oral)   Resp 20   Ht 5\' 1"  (1.549 m)   Wt 84.8 kg   LMP 01/22/2018 (Approximate)   SpO2 94%   BMI 35.33 kg/m  Total I/O In: 1929.9 [P.O.:650; I.V.:1279.9] Out: 1775 [Urine:1775]  She has shown cervical change. CERVIX: 5cm:  60 %:   -2:   posterior:   firm SVE:   Dilation: 7 Effacement (%): 80, 90 Station: -2 Exam by:: [name] CNM CONTRACTIONS: regular, every 2-4 minutes FHR: Fetal heart tracing reviewed. Baseline: 130 bpm, Variability: Good {> 6 bpm), Accelerations: Reactive and Decelerations: Absent Category I   Analgesia: Epidural, just placed   Labs: Lab Results  Component Value Date   WBC 16.1 (H) 10/17/2018   HGB 9.8 (L) 10/17/2018   HCT 30.4 (L) 10/17/2018   MCV 85.4 10/17/2018   PLT 119 (L) 10/17/2018    ASSESSMENT: 1) Labor curve reviewed.       Progress: Early latent labor.     Membranes: ruptured, clear fluid     IUPC placed       Active Problems:   Indication for care in labor and delivery, antepartum   Preeclampsia   Anemia in pregnancy   PLAN: continue present management. Titrate pitocin 200-250 MVU's    [name], [place]  10/17/2018 6:14 PM

## 2018-10-17 NOTE — Progress Notes (Signed)
LABOR NOTE   Tamara Bishop 23 y.o.@ at [redacted]w[redacted]d Early latent labor.  SUBJECTIVE:  Not feeling contractions. Has mild headache at this time.  OBJECTIVE:  BP (!) 139/100 (BP Location: Left Arm)   Pulse 89   Temp 98 F (36.7 C) (Oral)   Resp 16   Ht 5\' 1"  (1.549 m)   Wt 84.8 kg   LMP 01/22/2018 (Approximate)   SpO2 98%   BMI 35.33 kg/m  Total I/O In: 223.9 [I.V.:223.9] Out: 300 [Urine:300]  Reflexes 2 + bilaterally Lungs clear No visual disturbances Negative clonus  She has shown cervical change. CERVIX: 4cm:  60%:   -2:   posterior:   Firm, moderate swelling on perineum  SVE:   Dilation: 4 Effacement (%): 60 Station: -2 Exam by:: [name] CNM CONTRACTIONS: regular, every 2-5 minutes FHR: Fetal heart tracing reviewed. Baseline: 130 bpm, Variability: Good {> 6 bpm), Accelerations: Reactive and Decelerations: Absent Category I   Analgesia: Labor support without medications  Labs: Lab Results  Component Value Date   WBC 12.6 (H) 10/17/2018   HGB 9.2 (L) 10/17/2018   HCT 29.3 (L) 10/17/2018   MCV 86.9 10/17/2018   PLT 120 (L) 10/17/2018    ASSESSMENT: 1) Labor curve reviewed.       Progress: Early latent labor.     Membranes: ruptured, clear fluid             Active Problems:   Indication for care in labor and delivery, antepartum   Preeclampsia   Anemia in pregnancy   PLAN: continue present management, change maternal position to side lying and apply ice to perineum.    [name], CNM  10/17/2018 8:34 AM

## 2018-10-17 NOTE — Anesthesia Preprocedure Evaluation (Signed)
Anesthesia Evaluation  Patient identified by MRN, date of birth, ID band Patient awake    Reviewed: Allergy & Precautions, H&P , NPO status , Patient's Chart, lab work & pertinent test results  History of Anesthesia Complications Negative for: history of anesthetic complications  Airway Mallampati: II       Dental no notable dental hx. (+) Teeth Intact   Pulmonary former smoker,           Cardiovascular hypertension, On Medications      Neuro/Psych    GI/Hepatic   Endo/Other    Renal/GU      Musculoskeletal   Abdominal   Peds  Hematology  (+) Blood dyscrasia, anemia ,   Anesthesia Other Findings   Reproductive/Obstetrics (+) Pregnancy                             Anesthesia Physical Anesthesia Plan  ASA: II  Anesthesia Plan: Epidural   Post-op Pain Management:    Induction:   PONV Risk Score and Plan:   Airway Management Planned:   Additional Equipment:   Intra-op Plan:   Post-operative Plan:   Informed Consent: I have reviewed the patients History and Physical, chart, labs and discussed the procedure including the risks, benefits and alternatives for the proposed anesthesia with the patient or authorized representative who has indicated his/her understanding and acceptance.       Plan Discussed with: Anesthesiologist  Anesthesia Plan Comments:         Anesthesia Quick Evaluation

## 2018-10-17 NOTE — Progress Notes (Signed)
Spoke to [employer] from Infection prevention about the patient still being on droplet precaution with negative viral panel, complaining of no symptoms, and being afebrile. Ronny Bacon states that it is appropriate to discontinue droplet precautions. Droplet precautions have been discontinued.

## 2018-10-17 NOTE — Progress Notes (Signed)
LABOR NOTE   Tamara Bishop 22 y.o.@ at [redacted]w[redacted]d Active phase labor.  SUBJECTIVE:  Feeling pressure with epidural.  OBJECTIVE:  BP (!) 141/91   Pulse (!) 104   Temp 98.6 F (37 C) (Oral)   Resp 20   Ht 5\' 1"  (1.549 m)   Wt 84.8 kg   LMP 01/22/2018 (Approximate)   SpO2 94%   BMI 35.33 kg/m  Total I/O In: 1929.9 [P.O.:650; I.V.:1279.9] Out: 1775 [Urine:1775]  She has shown cervical change. CERVIX: 7cm:  85%:   -2:   mid position:   soft SVE:   Dilation: 7 Effacement (%): 80, 90 Station: -2 Exam by:: [name] CNM CONTRACTIONS: regular, every 2 minutes FHR: Fetal heart tracing reviewed. Baseline: 145 bpm, Variability: Good {> 6 bpm), Accelerations: Reactive and Decelerations: Absent Category I   Analgesia: Epidural  Labs: Lab Results  Component Value Date   WBC 16.1 (H) 10/17/2018   HGB 9.8 (L) 10/17/2018   HCT 30.4 (L) 10/17/2018   MCV 85.4 10/17/2018   PLT 119 (L) 10/17/2018    ASSESSMENT: 1) Labor curve reviewed.       Progress: Active phase labor.     Membranes: ruptured, clear fluid, IUPC           Active Problems:   Indication for care in labor and delivery, antepartum   Preeclampsia   Anemia in pregnancy   PLAN: continue present management   [name], CNM  10/17/2018 6:10 PM

## 2018-10-18 DIAGNOSIS — O1490 Unspecified pre-eclampsia, unspecified trimester: Secondary | ICD-10-CM

## 2018-10-18 LAB — CBC
HCT: 27.2 % — ABNORMAL LOW (ref 36.0–46.0)
Hemoglobin: 8.7 g/dL — ABNORMAL LOW (ref 12.0–15.0)
MCH: 27.8 pg (ref 26.0–34.0)
MCHC: 32 g/dL (ref 30.0–36.0)
MCV: 86.9 fL (ref 80.0–100.0)
Platelets: 139 10*3/uL — ABNORMAL LOW (ref 150–400)
RBC: 3.13 MIL/uL — ABNORMAL LOW (ref 3.87–5.11)
RDW: 13.9 % (ref 11.5–15.5)
WBC: 22.9 10*3/uL — ABNORMAL HIGH (ref 4.0–10.5)
nRBC: 0 % (ref 0.0–0.2)

## 2018-10-18 LAB — COMPREHENSIVE METABOLIC PANEL
ALT: 9 U/L (ref 0–44)
AST: 31 U/L (ref 15–41)
Albumin: 2.2 g/dL — ABNORMAL LOW (ref 3.5–5.0)
Alkaline Phosphatase: 118 U/L (ref 38–126)
Anion gap: 9 (ref 5–15)
BUN: 9 mg/dL (ref 6–20)
CO2: 17 mmol/L — AB (ref 22–32)
Calcium: 6.8 mg/dL — ABNORMAL LOW (ref 8.9–10.3)
Chloride: 107 mmol/L (ref 98–111)
Creatinine, Ser: 0.93 mg/dL (ref 0.44–1.00)
GFR calc Af Amer: >60 mL/min (ref >60)
GFR calc non Af Amer: >60 mL/min (ref >60)
GLUCOSE: 160 mg/dL — AB (ref 70–99)
Potassium: 3.9 mmol/L (ref 3.5–5.1)
SODIUM: 133 mmol/L — AB (ref 135–145)
Total Bilirubin: 0.3 mg/dL (ref 0.3–1.2)
Total Protein: 4.9 g/dL — ABNORMAL LOW (ref 6.5–8.1)

## 2018-10-18 MED ORDER — MENTHOL 3 MG MT LOZG
1.0000 | LOZENGE | OROMUCOSAL | Status: DC | PRN
  Filled 2018-10-18: qty 9

## 2018-10-18 MED ORDER — LABETALOL HCL 200 MG PO TABS
200.0000 mg | ORAL_TABLET | Freq: Two times a day (BID) | ORAL | Status: DC
  Administered 2018-10-19: 200 mg via ORAL
  Filled 2018-10-18: qty 1

## 2018-10-18 MED ORDER — LABETALOL HCL 100 MG PO TABS
100.0000 mg | ORAL_TABLET | Freq: Two times a day (BID) | ORAL | Status: DC
  Administered 2018-10-18: 100 mg via ORAL
  Filled 2018-10-18: qty 1

## 2018-10-18 MED ORDER — TETANUS-DIPHTH-ACELL PERTUSSIS 5-2.5-18.5 LF-MCG/0.5 IM SUSP: 0.5000 mL | Freq: Once | INTRAMUSCULAR | Status: DC

## 2018-10-18 MED ORDER — COCONUT OIL OIL: 1.0000 "application " | TOPICAL_OIL | Status: DC | PRN

## 2018-10-18 MED ORDER — LABETALOL HCL 100 MG PO TABS
100.0000 mg | ORAL_TABLET | Freq: Three times a day (TID) | ORAL | Status: DC
  Administered 2018-10-18: 100 mg via ORAL
  Filled 2018-10-18: qty 1

## 2018-10-18 NOTE — Anesthesia Postprocedure Evaluation (Signed)
Anesthesia Post Note  Patient: MARAGRET BOHLKEN  Procedure(s) Performed: AN AD HOC LABOR EPIDURAL  Patient location during evaluation: L&D Anesthesia Type: Epidural Level of consciousness: awake and alert and oriented Pain management: pain level controlled Vital Signs Assessment: post-procedure vital signs reviewed and stable Respiratory status: spontaneous breathing Cardiovascular status: stable Postop Assessment: patient able to bend at knees, no apparent nausea or vomiting and adequate PO intake Anesthetic complications: no     Last Vitals:  Vitals:   10/18/18 0645 10/18/18 0706  BP:  (!) 151/104  Pulse:  95  Resp:    Temp:  37.3 C  SpO2: 96%     Last Pain:  Vitals:   10/18/18 0706  TempSrc: Axillary  PainSc:                  Zachary George

## 2018-10-18 NOTE — Progress Notes (Signed)
Progress Note - Vaginal Delivery  Tamara Bishop is a 23 y.o. G1P1001 now PP day 1 s/p Vaginal, Spontaneous .  On Magnesium sulfate therapy for pre eclampsia   Subjective:  The patient reports no complaints, tolerating PO and + flatus. She has some blurred vision. Denies headache or epigastric pain.   Objective:  Vital signs in last 24 hours: Temp:  [97.9 F (36.6 C)-99.5 F (37.5 C)] 97.9 F (36.6 C) (03/19 0836) Pulse Rate:  [86-112] 110 (03/19 0936) Resp:  [16-20] 17 (03/19 0931) BP: (126-172)/(83-121) 157/105 (03/19 0936) SpO2:  [92 %-100 %] 93 % (03/19 1000)  Physical Exam:  General: alert, cooperative, appears stated age and fatigued  Lungs Clear bilaterally Reflexes: 2+ on right / 1+ on left. Negative clonus  Lochia: appropriate Uterine Fundus: firm DVT Evaluation: No evidence of DVT seen on physical exam. No cords or calf tenderness. Calf/Ankle edema is present.    Data Review Recent Labs    10/17/18 1411 10/18/18 0155  HGB 9.8* 8.7*  HCT 30.4* 27.2*    Assessment/Plan: Active Problems:   Indication for care in labor and delivery, antepartum   Preeclampsia   Anemia in pregnancy   Breastfeeding Continue Magnesium x 24 hr post delivery ( 2109) Start labetalol 100 mg BID ( Dr. Logan Bores consulted)    -- Continue routine PP care.     [name], CNM  10/18/2018 10:09 AM

## 2018-10-18 NOTE — Lactation Note (Signed)
This note was copied from a baby's chart. Lactation Consultation Note  Patient Name: Tamara Bishop [id number] Date: 10/18/2018 Reason for consult: Follow-up assessment   Maternal Data    Feeding Feeding Type: Breast Fed  LATCH Score                   Interventions    Lactation Tools Discussed/Used     Consult Status  Baby had fed for 15-20 minutes on Mom's left side with the use of breastfeeding support pillow.  Mom had requested assistance with latching baby on her right side since she had had difficulty latching on that side for most of the day.  Mom was shown how to keep baby in a cradle position but to slide her over to her right side to establish a football-hold.  In the football-hold baby was able to latch effectively but quickly fell asleep at the breast, presumed full.  Mom was very receptive to the new position and felt confident in her ability to replicate it the next time she has to feed on that side.    Gilman Killings Kelcy Laible 10/18/2018, 4:46 PM

## 2018-10-19 ENCOUNTER — Encounter: Payer: Medicaid Other | Admitting: Certified Nurse Midwife

## 2018-10-19 MED ORDER — LABETALOL HCL 200 MG PO TABS
200.0000 mg | ORAL_TABLET | Freq: Three times a day (TID) | ORAL | Status: DC
  Administered 2018-10-19: 200 mg via ORAL
  Filled 2018-10-19: qty 1

## 2018-10-19 MED ORDER — IBUPROFEN 600 MG PO TABS: 600.0000 mg | ORAL_TABLET | Freq: Four times a day (QID) | ORAL | 0 refills | Status: DC

## 2018-10-19 MED ORDER — LABETALOL HCL 200 MG PO TABS: 200.0000 mg | ORAL_TABLET | Freq: Three times a day (TID) | ORAL | 0 refills | Status: DC

## 2018-10-19 MED ORDER — HYDROCHLOROTHIAZIDE 12.5 MG PO CAPS
12.5000 mg | ORAL_CAPSULE | Freq: Every day | ORAL | Status: DC
  Administered 2018-10-19: 12.5 mg via ORAL
  Filled 2018-10-19 (×2): qty 1

## 2018-10-19 MED ORDER — CALCIUM CARBONATE ANTACID 500 MG PO CHEW
800.0000 mg | CHEWABLE_TABLET | Freq: Three times a day (TID) | ORAL | Status: DC
  Administered 2018-10-19: 800 mg via ORAL
  Filled 2018-10-19: qty 4

## 2018-10-19 MED ORDER — HYDROCHLOROTHIAZIDE 12.5 MG PO CAPS: 12.5000 mg | ORAL_CAPSULE | Freq: Every day | ORAL | 0 refills | Status: DC

## 2018-10-19 MED ORDER — FERROUS SULFATE 325 (65 FE) MG PO TABS: 325.0000 mg | ORAL_TABLET | Freq: Every day | ORAL | 3 refills | Status: DC

## 2018-10-19 NOTE — Progress Notes (Signed)
Patient discharged home with infant. Discharge instructions reviewed and given to pt. Pt verbalized understanding. Escorted out by staff.  

## 2018-10-19 NOTE — Discharge Summary (Signed)
Obstetric Discharge Summary  Patient ID: Tamara Bishop MRN: [id number] DOB/AGE: 1995-12-26 23 y.o.   Date of Admission: 10/16/2018  Date of Discharge:  10/19/18  Admitting Diagnosis: Induction of labor at [redacted]w[redacted]d  Secondary Diagnosis: Pre-eclampsia, History of anxiety and depression, History of marijuana use, Vitamin D Deficiency, Vitamin B-12 Deficiency, Former smoker  Mode of Delivery: Normal spontaneous vaginal delivery     Discharge Diagnosis: No other diagnosis   Intrapartum Procedures: Magnesium sulfate, Atificial rupture of membranes, epidural, pitocin augmentation, placement of intrauterine catheter and Cytotec induction   Post partum procedures: None  Complications: Second degree perineal laceration and left labial laceration, repaired   Brief Hospital Course   Akanksha GELILA KRUM is a G1P1001 who had a SVD on 10/17/2018;  for further details of this birth, please refer to the delivey note. Her postpartum course was complicated by elevated blood pressure requiring oral medications.  By time of discharge on PPD#2, her pain was controlled on oral pain medications; she had appropriate lochia and was ambulating, voiding without difficulty and tolerating regular diet.  She was deemed stable for discharge to home with follow up appointment scheduled early next week.   Labs: CBC Latest Ref Rng & Units 10/18/2018 10/17/2018 10/17/2018  WBC 4.0 - 10.5 K/uL 22.9(H) 16.1(H) 12.6(H)  Hemoglobin 12.0 - 15.0 g/dL 8.4(O) 9.6(E) 9.5(M)  Hematocrit 36.0 - 46.0 % 27.2(L) 30.4(L) 29.3(L)  Platelets 150 - 400 K/uL 139(L) 119(L) 120(L)   CMP Latest Ref Rng & Units 10/18/2018 10/17/2018 10/17/2018  Glucose 70 - 99 mg/dL 841(L) 82 89  BUN 6 - 20 mg/dL 9 9 10   Creatinine 0.44 - 1.00 mg/dL 2.44 0.10 2.72  Sodium 135 - 145 mmol/L 133(L) 135 138  Potassium 3.5 - 5.1 mmol/L 3.9 3.7 3.7  Chloride 98 - 111 mmol/L 107 106 110  CO2 22 - 32 mmol/L 17(L) 21(L) 21(L)  Calcium 8.9 - 10.3 mg/dL 5.3(G)  7.2(L) 8.0(L)  Total Protein 6.5 - 8.1 g/dL 4.9(L) 5.4(L) 5.4(L)  Total Bilirubin 0.3 - 1.2 mg/dL 0.3 6.4(Q) 0.6  Alkaline Phos 38 - 126 U/L 118 145(H) 139(H)  AST 15 - 41 U/L 31 17 18   ALT 0 - 44 U/L 9 8 9     A POS  Physical exam:   Temp:  [98.2 F (36.8 C)-98.7 F (37.1 C)] 98.7 F (37.1 C) (03/20 1505) Pulse Rate:  [75-116] 75 (03/20 1505) Resp:  [16-20] 18 (03/20 1505) BP: (132-163)/(83-106) 132/86 (03/20 1505) SpO2:  [93 %-100 %] 98 % (03/20 0849)  General: alert and no distress  Lochia: appropriate  Abdomen: soft, NT  Uterine Fundus: firm  Lacerations: healing well, no significant drainage, no dehiscence, no significant erythema  Extremities: No evidence of DVT seen on physical exam.   Discharge Instructions: Per After Visit Summary.  Activity: Advance as tolerated. Pelvic rest for 6 weeks.  Also refer to After Visit Summary  Diet: Regular  Medications: Allergies as of 10/19/2018      Reactions   Azithromycin Other (See Comments)   Felt like her stomach was going to come out of her body      Medication List    STOP taking these medications   cyanocobalamin 1000 MCG/ML injection Commonly known as:  (VITAMIN B-12)     TAKE these medications   CitraNatal Bloom 90-1 MG Tabs Take 1 tablet by mouth daily.   ferrous sulfate 325 (65 FE) MG tablet Take 1 tablet (325 mg total) by mouth daily with breakfast. Start taking on:  October 20, 2018   hydrochlorothiazide 12.5 MG capsule Commonly known as:  MICROZIDE Take 1 capsule (12.5 mg total) by mouth daily. Start taking on:  October 20, 2018   ibuprofen 600 MG tablet Commonly known as:  ADVIL,MOTRIN Take 1 tablet (600 mg total) by mouth every 6 (six) hours.   labetalol 200 MG tablet Commonly known as:  NORMODYNE Take 1 tablet (200 mg total) by mouth 3 (three) times daily.   Vitamin D (Ergocalciferol) 1.25 MG (50000 UT) Caps capsule Commonly known as:  DRISDOL Take 1 capsule (50,000 Units total) by mouth  2 (two) times a week.      Outpatient follow up:  Follow-up Information    Gunnar Bulla, CNM. Schedule an appointment as soon as possible for a visit in 3 day(s).   Specialties:  Certified Nurse Midwife, Obstetrics and Gynecology, Radiology Why:  Please make appointment for blood check appointment for Monday Contact information: [address] [place] [address] [phone]          Postpartum contraception: abstinence, IUD-Mirena  Discharged Condition: stable  Discharged to: home   Newborn Data:  Disposition:home with mother  Apgars: APGAR (1 MIN): 8   APGAR (5 MINS): 9    Baby Feeding: Breast   Gunnar Bulla, CNM [facility], [facility] 10/19/18 4:02 PM

## 2018-10-19 NOTE — Addendum Note (Signed)
Addendum  created 10/19/18 0921 by Jovita Gamma, MD   Attestation recorded in Intraprocedure, Intraprocedure Attestations filed

## 2018-10-19 NOTE — Discharge Instructions (Signed)
Please call your doctor or return to the ER if you experience any chest pains, shortness of breath, dizziness, visual changes, fever greater than 101, any heavy bleeding (saturating more than 1 pad per hour), large clots, or foul smelling discharge, any worsening abdominal pain and cramping that is not controlled by pain medication, or any signs of postpartum depression. No tampons, enemas, douches, or sexual intercourse for 6 weeks. Also avoid tub baths, hot tubs, or swimming for 6 weeks.            Contraception Choices Contraception, also called birth control, means things to use or ways to try not to get pregnant. Hormonal birth control This kind of birth control uses hormones. Here are some types of hormonal birth control:  A tube that is put under skin of the arm (implant). The tube can stay in for as long as 3 years.  Shots to get every 3 months (injections).  Pills to take every day (birth control pills).  A patch to change 1 time each week for 3 weeks (birth control patch). After that, the patch is taken off for 1 week.  A ring to put in the vagina. The ring is left in for 3 weeks. Then it is taken out of the vagina for 1 week. Then a new ring is put in.  Pills to take after unprotected sex (emergency birth control pills). Barrier birth control Here are some types of barrier birth control:  A thin covering that is put on the penis before sex (female condom). The covering is thrown away after sex.  A soft, loose covering that is put in the vagina before sex (female condom). The covering is thrown away after sex.  A rubber bowl that sits over the cervix (diaphragm). The bowl must be made for you. The bowl is put into the vagina before sex. The bowl is left in for 6-8 hours after sex. It is taken out within 24 hours.  A small, soft cup that fits over the cervix (cervical cap). The cup must be made for you. The cup can be left in for 6-8 hours after sex. It is taken out within  48 hours.  A sponge that is put into the vagina before sex. It must be left in for at least 6 hours after sex. It must be taken out within 30 hours. Then it is thrown away.  A chemical that kills or stops sperm from getting into the uterus (spermicide). It may be a pill, cream, jelly, or foam to put in the vagina. The chemical should be used at least 10-15 minutes before sex. IUD (intrauterine) birth control An IUD is a small, T-shaped piece of plastic. It is put inside the uterus. There are two kinds:  Hormone IUD. This kind can stay in for 3-5 years.  Copper IUD. This kind can stay in for 10 years. Permanent birth control Here are some types of permanent birth control:  Surgery to block the fallopian tubes.  Having an insert put into each fallopian tube.  Surgery to tie off the tubes that carry sperm (vasectomy). Natural planning birth control Here are some types of natural planning birth control:  Not having sex on the days the woman could get pregnant.  Using a calendar: ? To keep track of the length of each period. ? To find out what days pregnancy can happen. ? To plan to not have sex on days when pregnancy can happen.  Watching for symptoms of ovulation and  not having sex during ovulation. One way the woman can check for ovulation is to check her temperature.  Waiting to have sex until after ovulation. Summary  Contraception, also called birth control, means things to use or ways to try not to get pregnant.  Hormonal methods of birth control include implants, injections, pills, patches, vaginal rings, and emergency birth control pills.  Barrier methods of birth control can include female condoms, female condoms, diaphragms, cervical caps, sponges, and spermicides.  There are two types of IUD (intrauterine device) birth control. An IUD can be put in a woman's uterus to prevent pregnancy for 3-5 years.  Permanent sterilization can be done through a procedure for males,  females, or both.  Natural planning methods involve not having sex on the days when the woman could get pregnant. This information is not intended to replace advice given to you by your health care provider. Make sure you discuss any questions you have with your health care provider. Document Released: 05/15/2009 Document Revised: 02/22/2018 Document Reviewed: 07/28/2016 Elsevier Interactive Patient Education  2019 [employer].   Levonorgestrel intrauterine device (IUD) What is this medicine? LEVONORGESTREL IUD (LEE voe nor jes trel) is a contraceptive (birth control) device. The device is placed inside the uterus by a healthcare professional. It is used to prevent pregnancy. This device can also be used to treat heavy bleeding that occurs during your period. This medicine may be used for other purposes; ask your health care provider or pharmacist if you have questions. COMMON BRAND NAME(S): Cameron Ali What should I tell my health care provider before I take this medicine? They need to know if you have any of these conditions: -abnormal Pap smear -cancer of the breast, uterus, or cervix -diabetes -endometritis -genital or pelvic infection now or in the past -have more than one sexual partner or your partner has more than one partner -heart disease -history of an ectopic or tubal pregnancy -immune system problems -IUD in place -liver disease or tumor -problems with blood clots or take blood-thinners -seizures -use intravenous drugs -uterus of unusual shape -vaginal bleeding that has not been explained -an unusual or allergic reaction to levonorgestrel, other hormones, silicone, or polyethylene, medicines, foods, dyes, or preservatives -pregnant or trying to get pregnant -breast-feeding How should I use this medicine? This device is placed inside the uterus by a health care professional. Talk to your pediatrician regarding the use of this medicine in children.  Special care may be needed. Overdosage: If you think you have taken too much of this medicine contact a poison control center or emergency room at once. NOTE: This medicine is only for you. Do not share this medicine with others. What if I miss a dose? This does not apply. Depending on the brand of device you have inserted, the device will need to be replaced every 3 to 5 years if you wish to continue using this type of birth control. What may interact with this medicine? Do not take this medicine with any of the following medications: -amprenavir -bosentan -fosamprenavir This medicine may also interact with the following medications: -aprepitant -armodafinil -barbiturate medicines for inducing sleep or treating seizures -bexarotene -boceprevir -griseofulvin -medicines to treat seizures like carbamazepine, ethotoin, felbamate, oxcarbazepine, phenytoin, topiramate -modafinil -pioglitazone -rifabutin -rifampin -rifapentine -some medicines to treat HIV infection like atazanavir, efavirenz, indinavir, lopinavir, nelfinavir, tipranavir, ritonavir -St. John's wort -warfarin This list may not describe all possible interactions. Give your health care provider a list of all the medicines, herbs,  non-prescription drugs, or dietary supplements you use. Also tell them if you smoke, drink alcohol, or use illegal drugs. Some items may interact with your medicine. What should I watch for while using this medicine? Visit your doctor or health care professional for regular check ups. See your doctor if you or your partner has sexual contact with others, becomes HIV positive, or gets a sexual transmitted disease. This product does not protect you against HIV infection (AIDS) or other sexually transmitted diseases. You can check the placement of the IUD yourself by reaching up to the top of your vagina with clean fingers to feel the threads. Do not pull on the threads. It is a good habit to check  placement after each menstrual period. Call your doctor right away if you feel more of the IUD than just the threads or if you cannot feel the threads at all. The IUD may come out by itself. You may become pregnant if the device comes out. If you notice that the IUD has come out use a backup birth control method like condoms and call your health care provider. Using tampons will not change the position of the IUD and are okay to use during your period. This IUD can be safely scanned with magnetic resonance imaging (MRI) only under specific conditions. Before you have an MRI, tell your healthcare provider that you have an IUD in place, and which type of IUD you have in place. What side effects may I notice from receiving this medicine? Side effects that you should report to your doctor or health care professional as soon as possible: -allergic reactions like skin rash, itching or hives, swelling of the face, lips, or tongue -fever, flu-like symptoms -genital sores -high blood pressure -no menstrual period for 6 weeks during use -pain, swelling, warmth in the leg -pelvic pain or tenderness -severe or sudden headache -signs of pregnancy -stomach cramping -sudden shortness of breath -trouble with balance, talking, or walking -unusual vaginal bleeding, discharge -yellowing of the eyes or skin Side effects that usually do not require medical attention (report to your doctor or health care professional if they continue or are bothersome): -acne -breast pain -change in sex drive or performance -changes in weight -cramping, dizziness, or faintness while the device is being inserted -headache -irregular menstrual bleeding within first 3 to 6 months of use -nausea This list may not describe all possible side effects. Call your doctor for medical advice about side effects. You may report side effects to FDA at 1-800-FDA-1088. Where should I keep my medicine? This does not apply. NOTE: This sheet  is a summary. It may not cover all possible information. If you have questions about this medicine, talk to your doctor, pharmacist, or health care provider.  2019 Elsevier/Gold Standard (2016-04-29 14:14:56)   Breastfeeding  Choosing to breastfeed is one of the best decisions you can make for yourself and your baby. A change in hormones during pregnancy causes your breasts to make breast milk in your milk-producing glands. Hormones prevent breast milk from being released before your baby is born. They also prompt milk flow after birth. Once breastfeeding has begun, thoughts of your baby, as well as his or her sucking or crying, can stimulate the release of milk from your milk-producing glands. Benefits of breastfeeding Research shows that breastfeeding offers many health benefits for infants and mothers. It also offers a cost-free and convenient way to feed your baby. For your baby  Your first milk (colostrum) helps your baby's digestive  system to function better.  Special cells in your milk (antibodies) help your baby to fight off infections.  Breastfed babies are less likely to develop asthma, allergies, obesity, or type 2 diabetes. They are also at lower risk for sudden infant death syndrome (SIDS).  Nutrients in breast milk are better able to meet your babys needs compared to infant formula.  Breast milk improves your baby's brain development. For you  Breastfeeding helps to create a very special bond between you and your baby.  Breastfeeding is convenient. Breast milk costs nothing and is always available at the correct temperature.  Breastfeeding helps to burn calories. It helps you to lose the weight that you gained during pregnancy.  Breastfeeding makes your uterus return faster to its size before pregnancy. It also slows bleeding (lochia) after you give birth.  Breastfeeding helps to lower your risk of developing type 2 diabetes, osteoporosis, rheumatoid arthritis,  cardiovascular disease, and breast, ovarian, uterine, and endometrial cancer later in life. Breastfeeding basics Starting breastfeeding  Find a comfortable place to sit or lie down, with your neck and back well-supported.  Place a pillow or a rolled-up blanket under your baby to bring him or her to the level of your breast (if you are seated). Nursing pillows are specially designed to help support your arms and your baby while you breastfeed.  Make sure that your baby's tummy (abdomen) is facing your abdomen.  Gently massage your breast. With your fingertips, massage from the outer edges of your breast inward toward the nipple. This encourages milk flow. If your milk flows slowly, you may need to continue this action during the feeding.  Support your breast with 4 fingers underneath and your thumb above your nipple (make the letter "C" with your hand). Make sure your fingers are well away from your nipple and your babys mouth.  Stroke your baby's lips gently with your finger or nipple.  When your baby's mouth is open wide enough, quickly bring your baby to your breast, placing your entire nipple and as much of the areola as possible into your baby's mouth. The areola is the colored area around your nipple. ? More areola should be visible above your baby's upper lip than below the lower lip. ? Your baby's lips should be opened and extended outward (flanged) to ensure an adequate, comfortable latch. ? Your baby's tongue should be between his or her lower gum and your breast.  Make sure that your baby's mouth is correctly positioned around your nipple (latched). Your baby's lips should create a seal on your breast and be turned out (everted).  It is common for your baby to suck about 2-3 minutes in order to start the flow of breast milk. Latching Teaching your baby how to latch onto your breast properly is very important. An improper latch can cause nipple pain, decreased milk supply, and poor  weight gain in your baby. Also, if your baby is not latched onto your nipple properly, he or she may swallow some air during feeding. This can make your baby fussy. Burping your baby when you switch breasts during the feeding can help to get rid of the air. However, teaching your baby to latch on properly is still the best way to prevent fussiness from swallowing air while breastfeeding. Signs that your baby has successfully latched onto your nipple  Silent tugging or silent sucking, without causing you pain. Infant's lips should be extended outward (flanged).  Swallowing heard between every 3-4 sucks once your  milk has started to flow (after your let-down milk reflex occurs).  Muscle movement above and in front of his or her ears while sucking. Signs that your baby has not successfully latched onto your nipple  Sucking sounds or smacking sounds from your baby while breastfeeding.  Nipple pain. If you think your baby has not latched on correctly, slip your finger into the corner of your babys mouth to break the suction and place it between your baby's gums. Attempt to start breastfeeding again. Signs of successful breastfeeding Signs from your baby  Your baby will gradually decrease the number of sucks or will completely stop sucking.  Your baby will fall asleep.  Your baby's body will relax.  Your baby will retain a small amount of milk in his or her mouth.  Your baby will let go of your breast by himself or herself. Signs from you  Breasts that have increased in firmness, weight, and size 1-3 hours after feeding.  Breasts that are softer immediately after breastfeeding.  Increased milk volume, as well as a change in milk consistency and color by the fifth day of breastfeeding.  Nipples that are not sore, cracked, or bleeding. Signs that your baby is getting enough milk  Wetting at least 1-2 diapers during the first 24 hours after birth.  Wetting at least 5-6 diapers every 24  hours for the first week after birth. The urine should be clear or pale yellow by the age of 5 days.  Wetting 6-8 diapers every 24 hours as your baby continues to grow and develop.  At least 3 stools in a 24-hour period by the age of 5 days. The stool should be soft and yellow.  At least 3 stools in a 24-hour period by the age of 7 days. The stool should be seedy and yellow.  No loss of weight greater than 10% of birth weight during the first 3 days of life.  Average weight gain of 4-7 oz (113-198 g) per week after the age of 4 days.  Consistent daily weight gain by the age of 5 days, without weight loss after the age of 2 weeks. After a feeding, your baby may spit up a small amount of milk. This is normal. Breastfeeding frequency and duration Frequent feeding will help you make more milk and can prevent sore nipples and extremely full breasts (breast engorgement). Breastfeed when you feel the need to reduce the fullness of your breasts or when your baby shows signs of hunger. This is called "breastfeeding on demand." Signs that your baby is hungry include:  Increased alertness, activity, or restlessness.  Movement of the head from side to side.  Opening of the mouth when the corner of the mouth or cheek is stroked (rooting).  Increased sucking sounds, smacking lips, cooing, sighing, or squeaking.  Hand-to-mouth movements and sucking on fingers or hands.  Fussing or crying. Avoid introducing a pacifier to your baby in the first 4-6 weeks after your baby is born. After this time, you may choose to use a pacifier. Research has shown that pacifier use during the first year of a baby's life decreases the risk of sudden infant death syndrome (SIDS). Allow your baby to feed on each breast as long as he or she wants. When your baby unlatches or falls asleep while feeding from the first breast, offer the second breast. Because newborns are often sleepy in the first few weeks of life, you may  need to awaken your baby to get him  or her to feed. Breastfeeding times will vary from baby to baby. However, the following rules can serve as a guide to help you make sure that your baby is properly fed:  Newborns (babies 9 weeks of age or younger) may breastfeed every 1-3 hours.  Newborns should not go without breastfeeding for longer than 3 hours during the day or 5 hours during the night.  You should breastfeed your baby a minimum of 8 times in a 24-hour period. Breast milk pumping     Pumping and storing breast milk allows you to make sure that your baby is exclusively fed your breast milk, even at times when you are unable to breastfeed. This is especially important if you go back to work while you are still breastfeeding, or if you are not able to be present during feedings. Your lactation consultant can help you find a method of pumping that works best for you and give you guidelines about how long it is safe to store breast milk. Caring for your breasts while you breastfeed Nipples can become dry, cracked, and sore while breastfeeding. The following recommendations can help keep your breasts moisturized and healthy:  Avoid using soap on your nipples.  Wear a supportive bra designed especially for nursing. Avoid wearing underwire-style bras or extremely tight bras (sports bras).  Air-dry your nipples for 3-4 minutes after each feeding.  Use only cotton bra pads to absorb leaked breast milk. Leaking of breast milk between feedings is normal.  Use lanolin on your nipples after breastfeeding. Lanolin helps to maintain your skin's normal moisture barrier. Pure lanolin is not harmful (not toxic) to your baby. You may also hand express a few drops of breast milk and gently massage that milk into your nipples and allow the milk to air-dry. In the first few weeks after giving birth, some women experience breast engorgement. Engorgement can make your breasts feel heavy, warm, and tender to  the touch. Engorgement peaks within 3-5 days after you give birth. The following recommendations can help to ease engorgement:  Completely empty your breasts while breastfeeding or pumping. You may want to start by applying warm, moist heat (in the shower or with warm, water-soaked hand towels) just before feeding or pumping. This increases circulation and helps the milk flow. If your baby does not completely empty your breasts while breastfeeding, pump any extra milk after he or she is finished.  Apply ice packs to your breasts immediately after breastfeeding or pumping, unless this is too uncomfortable for you. To do this: ? Put ice in a plastic bag. ? Place a towel between your skin and the bag. ? Leave the ice on for 20 minutes, 2-3 times a day.  Make sure that your baby is latched on and positioned properly while breastfeeding. If engorgement persists after 48 hours of following these recommendations, contact your health care provider or a Advertising copywriter. Overall health care recommendations while breastfeeding  Eat 3 healthy meals and 3 snacks every day. Well-nourished mothers who are breastfeeding need an additional 450-500 calories a day. You can meet this requirement by increasing the amount of a balanced diet that you eat.  Drink enough water to keep your urine pale yellow or clear.  Rest often, relax, and continue to take your prenatal vitamins to prevent fatigue, stress, and low vitamin and mineral levels in your body (nutrient deficiencies).  Do not use any products that contain nicotine or tobacco, such as cigarettes and e-cigarettes. Your baby may be harmed  by chemicals from cigarettes that pass into breast milk and exposure to secondhand smoke. If you need help quitting, ask your health care provider.  Avoid alcohol.  Do not use illegal drugs or marijuana.  Talk with your health care provider before taking any medicines. These include over-the-counter and prescription  medicines as well as vitamins and herbal supplements. Some medicines that may be harmful to your baby can pass through breast milk.  It is possible to become pregnant while breastfeeding. If birth control is desired, ask your health care provider about options that will be safe while breastfeeding your baby. Where to find more information: [employer]: www.llli.org Contact a health care provider if:  You feel like you want to stop breastfeeding or have become frustrated with breastfeeding.  Your nipples are cracked or bleeding.  Your breasts are red, tender, or warm.  You have: ? Painful breasts or nipples. ? A swollen area on either breast. ? A fever or chills. ? Nausea or vomiting. ? Drainage other than breast milk from your nipples.  Your breasts do not become full before feedings by the fifth day after you give birth.  You feel sad and depressed.  Your baby is: ? Too sleepy to eat well. ? Having trouble sleeping. ? More than 54 week old and wetting fewer than 6 diapers in a 24-hour period. ? Not gaining weight by 30 days of age.  Your baby has fewer than 3 stools in a 24-hour period.  Your baby's skin or the white parts of his or her eyes become yellow. Get help right away if:  Your baby is overly tired (lethargic) and does not want to wake up and feed.  Your baby develops an unexplained fever. Summary  Breastfeeding offers many health benefits for infant and mothers.  Try to breastfeed your infant when he or she shows early signs of hunger.  Gently tickle or stroke your baby's lips with your finger or nipple to allow the baby to open his or her mouth. Bring the baby to your breast. Make sure that much of the areola is in your baby's mouth. Offer one side and burp the baby before you offer the other side.  Talk with your health care provider or lactation consultant if you have questions or you face problems as you breastfeed. This information is not  intended to replace advice given to you by your health care provider. Make sure you discuss any questions you have with your health care provider. Document Released: 07/18/2005 Document Revised: 08/19/2016 Document Reviewed: 08/19/2016 Elsevier Interactive Patient Education  2019 [employer]   Vaginal Delivery, Care After Refer to this sheet in the next few weeks. These instructions provide you with information about caring for yourself after vaginal delivery. Your health care provider may also give you more specific instructions. Your treatment has been planned according to current medical practices, but problems sometimes occur. Call your health care provider if you have any problems or questions. What can I expect after the procedure? After vaginal delivery, it is common to have:  Some bleeding from your vagina.  Soreness in your abdomen, your vagina, and the area of skin between your vaginal opening and your anus (perineum).  Pelvic cramps.  Fatigue. Follow these instructions at home: Medicines  Take over-the-counter and prescription medicines only as told by your health care provider.  If you were prescribed an antibiotic medicine, take it as told by your health care provider. Do not stop taking the  antibiotic until it is finished. Driving   Do not drive or operate heavy machinery while taking prescription pain medicine.  Do not drive for 24 hours if you received a sedative. Lifestyle  Do not drink alcohol. This is especially important if you are breastfeeding or taking medicine to relieve pain.  Do not use tobacco products, including cigarettes, chewing tobacco, or e-cigarettes. If you need help quitting, ask your health care provider. Eating and drinking  Drink at least 8 eight-ounce glasses of water every day unless you are told not to by your health care provider. If you choose to breastfeed your baby, you may need to drink more water than this.  Eat high-fiber foods  every day. These foods may help prevent or relieve constipation. High-fiber foods include: ? Whole grain cereals and breads. ? Brown rice. ? Beans. ? Fresh fruits and vegetables. Activity  Return to your normal activities as told by your health care provider. Ask your health care provider what activities are safe for you.  Rest as much as possible. Try to rest or take a nap when your baby is sleeping.  Do not lift anything that is heavier than your baby or 10 lb (4.5 kg) until your health care provider says that it is safe.  Talk with your health care provider about when you can engage in sexual activity. This may depend on your: ? Risk of infection. ? Rate of healing. ? Comfort and desire to engage in sexual activity. Vaginal Care  If you have an episiotomy or a vaginal tear, check the area every day for signs of infection. Check for: ? More redness, swelling, or pain. ? More fluid or blood. ? Warmth. ? Pus or a bad smell.  Do not use tampons or douches until your health care provider says this is safe.  Watch for any blood clots that may pass from your vagina. These may look like clumps of dark red, brown, or black discharge. General instructions  Keep your perineum clean and dry as told by your health care provider.  Wear loose, comfortable clothing.  Wipe from front to back when you use the toilet.  Ask your health care provider if you can shower or take a bath. If you had an episiotomy or a perineal tear during labor and delivery, your health care provider may tell you not to take baths for a certain length of time.  Wear a bra that supports your breasts and fits you well.  If possible, have someone help you with household activities and help care for your baby for at least a few days after you leave the hospital.  Keep all follow-up visits for you and your baby as told by your health care provider. This is important. Contact a health care provider if:  You  have: ? Vaginal discharge that has a bad smell. ? Difficulty urinating. ? Pain when urinating. ? A sudden increase or decrease in the frequency of your bowel movements. ? More redness, swelling, or pain around your episiotomy or vaginal tear. ? More fluid or blood coming from your episiotomy or vaginal tear. ? Pus or a bad smell coming from your episiotomy or vaginal tear. ? A fever. ? A rash. ? Little or no interest in activities you used to enjoy. ? Questions about caring for yourself or your baby.  Your episiotomy or vaginal tear feels warm to the touch.  Your episiotomy or vaginal tear is separating or does not appear to be healing.  Your breasts are painful, hard, or turn red.  You feel unusually sad or worried.  You feel nauseous or you vomit.  You pass large blood clots from your vagina. If you pass a blood clot from your vagina, save it to show to your health care provider. Do not flush blood clots down the toilet without having your health care provider look at them.  You urinate more than usual.  You are dizzy or light-headed.  You have not breastfed at all and you have not had a menstrual period for 12 weeks after delivery.  You have stopped breastfeeding and you have not had a menstrual period for 12 weeks after you stopped breastfeeding. Get help right away if:  You have: ? Pain that does not go away or does not get better with medicine. ? Chest pain. ? Difficulty breathing. ? Blurred vision or spots in your vision. ? Thoughts about hurting yourself or your baby.  You develop pain in your abdomen or in one of your legs.  You develop a severe headache.  You faint.  You bleed from your vagina so much that you fill two sanitary pads in one hour. This information is not intended to replace advice given to you by your health care provider. Make sure you discuss any questions you have with your health care provider. Document Released: 07/15/2000 Document  Revised: 12/30/2015 Document Reviewed: 08/02/2015 Elsevier Interactive Patient Education  2019 [employer] Postpartum Care After Vaginal Delivery This sheet gives you information about how to care for yourself from the time you deliver your baby to up to 6-12 weeks after delivery (postpartum period). Your health care provider may also give you more specific instructions. If you have problems or questions, contact your health care provider. Follow these instructions at home: Vaginal bleeding  It is normal to have vaginal bleeding (lochia) after delivery. Wear a sanitary pad for vaginal bleeding and discharge. ? During the first week after delivery, the amount and appearance of lochia is often similar to a menstrual period. ? Over the next few weeks, it will gradually decrease to a dry, yellow-brown discharge. ? For most women, lochia stops completely by 4-6 weeks after delivery. Vaginal bleeding can vary from woman to woman.  Change your sanitary pads frequently. Watch for any changes in your flow, such as: ? A sudden increase in volume. ? A change in color. ? Large blood clots.  If you pass a blood clot from your vagina, save it and call your health care provider to discuss. Do not flush blood clots down the toilet before talking with your health care provider.  Do not use tampons or douches until your health care provider says this is safe.  If you are not breastfeeding, your period should return 6-8 weeks after delivery. If you are feeding your child breast milk only (exclusive breastfeeding), your period may not return until you stop breastfeeding. Perineal care  Keep the area between the vagina and the anus (perineum) clean and dry as told by your health care provider. Use medicated pads and pain-relieving sprays and creams as directed.  If you had a cut in the perineum (episiotomy) or a tear in the vagina, check the area for signs of infection until you are healed. Check  for: ? More redness, swelling, or pain. ? Fluid or blood coming from the cut or tear. ? Warmth. ? Pus or a bad smell.  You may be given a squirt bottle to use instead of wiping to  clean the perineum area after you go to the bathroom. As you start healing, you may use the squirt bottle before wiping yourself. Make sure to wipe gently.  To relieve pain caused by an episiotomy, a tear in the vagina, or swollen veins in the anus (hemorrhoids), try taking a warm sitz bath 2-3 times a day. A sitz bath is a warm water bath that is taken while you are sitting down. The water should only come up to your hips and should cover your buttocks. Breast care  Within the first few days after delivery, your breasts may feel heavy, full, and uncomfortable (breast engorgement). Milk may also leak from your breasts. Your health care provider can suggest ways to help relieve the discomfort. Breast engorgement should go away within a few days.  If you are breastfeeding: ? Wear a bra that supports your breasts and fits you well. ? Keep your nipples clean and dry. Apply creams and ointments as told by your health care provider. ? You may need to use breast pads to absorb milk that leaks from your breasts. ? You may have uterine contractions every time you breastfeed for up to several weeks after delivery. Uterine contractions help your uterus return to its normal size. ? If you have any problems with breastfeeding, work with your health care provider or Advertising copywriter.  If you are not breastfeeding: ? Avoid touching your breasts a lot. Doing this can make your breasts produce more milk. ? Wear a good-fitting bra and use cold packs to help with swelling. ? Do not squeeze out (express) milk. This causes you to make more milk. Intimacy and sexuality  Ask your health care provider when you can engage in sexual activity. This may depend on: ? Your risk of infection. ? How fast you are healing. ? Your comfort  and desire to engage in sexual activity.  You are able to get pregnant after delivery, even if you have not had your period. If desired, talk with your health care provider about methods of birth control (contraception). Medicines  Take over-the-counter and prescription medicines only as told by your health care provider.  If you were prescribed an antibiotic medicine, take it as told by your health care provider. Do not stop taking the antibiotic even if you start to feel better. Activity  Gradually return to your normal activities as told by your health care provider. Ask your health care provider what activities are safe for you.  Rest as much as possible. Try to rest or take a nap while your baby is sleeping. Eating and drinking   Drink enough fluid to keep your urine pale yellow.  Eat high-fiber foods every day. These may help prevent or relieve constipation. High-fiber foods include: ? Whole grain cereals and breads. ? Brown rice. ? Beans. ? Fresh fruits and vegetables.  Do not try to lose weight quickly by cutting back on calories.  Take your prenatal vitamins until your postpartum checkup or until your health care provider tells you it is okay to stop. Lifestyle  Do not use any products that contain nicotine or tobacco, such as cigarettes and e-cigarettes. If you need help quitting, ask your health care provider.  Do not drink alcohol, especially if you are breastfeeding. General instructions  Keep all follow-up visits for you and your baby as told by your health care provider. Most women visit their health care provider for a postpartum checkup within the first 3-6 weeks after delivery. Contact a health  care provider if:  You feel unable to cope with the changes that your child brings to your life, and these feelings do not go away.  You feel unusually sad or worried.  Your breasts become red, painful, or hard.  You have a fever.  You have trouble holding urine  or keeping urine from leaking.  You have little or no interest in activities you used to enjoy.  You have not breastfed at all and you have not had a menstrual period for 12 weeks after delivery.  You have stopped breastfeeding and you have not had a menstrual period for 12 weeks after you stopped breastfeeding.  You have questions about caring for yourself or your baby.  You pass a blood clot from your vagina. Get help right away if:  You have chest pain.  You have difficulty breathing.  You have sudden, severe leg pain.  You have severe pain or cramping in your lower abdomen.  You bleed from your vagina so much that you fill more than one sanitary pad in one hour. Bleeding should not be heavier than your heaviest period.  You develop a severe headache.  You faint.  You have blurred vision or spots in your vision.  You have bad-smelling vaginal discharge.  You have thoughts about hurting yourself or your baby. If you ever feel like you may hurt yourself or others, or have thoughts about taking your own life, get help right away. You can go to the nearest emergency department or call:  Your local emergency services (911 in the [place]  A suicide crisis helpline, such as the National Suicide Prevention Lifeline at 5737967116. This is open 24 hours a day. Summary  The period of time right after you deliver your newborn up to 6-12 weeks after delivery is called the postpartum period.  Gradually return to your normal activities as told by your health care provider.  Keep all follow-up visits for you and your baby as told by your health care provider. This information is not intended to replace advice given to you by your health care provider. Make sure you discuss any questions you have with your health care provider. Document Released: 05/15/2007 Document Revised: 05/01/2017 Document Reviewed: 05/01/2017 Elsevier Interactive Patient Education  2019 [employer]. Postpartum Baby Blues The postpartum period begins right after the birth of a baby. During this time, there is often a lot of joy and excitement. It is also a time of many changes in the life of the parents. No matter how many times a mother gives birth, each child brings new challenges to the family, including different ways of relating to one another. It is common to have feelings of excitement along with confusing changes in moods, emotions, and thoughts. You may feel happy one minute and sad or stressed the next. These feelings of sadness usually happen in the period right after you have your baby, and they go away within a week or two. This is called the "baby blues." What are the causes? There is no known cause of baby blues. It is likely caused by a combination of factors. However, changes in hormone levels after childbirth are believed to trigger some of the symptoms. Other factors that can play a role in these mood changes include:  Lack of sleep.  Stressful life events, such as poverty, caring for a loved one, or death of a loved one.  Genetics. What are the signs or symptoms? Symptoms of this condition include:  Brief  changes in mood, such as going from extreme happiness to sadness.  Decreased concentration.  Difficulty sleeping.  Crying spells and tearfulness.  Loss of appetite.  Irritability.  Anxiety. If the symptoms of baby blues last for more than 2 weeks or become more severe, you may have postpartum depression. How is this diagnosed? This condition is diagnosed based on an evaluation of your symptoms. There are no medical or lab tests that lead to a diagnosis, but there are various questionnaires that a health care provider may use to identify women with the baby blues or postpartum depression. How is this treated? Treatment is not needed for this condition. The baby blues usually go away on their own in 1-2 weeks. Social support is often all that is needed.  You will be encouraged to get adequate sleep and rest. Follow these instructions at home: Lifestyle      Get as much rest as you can. Take a nap when the baby sleeps.  Exercise regularly as told by your health care provider. Some women find yoga and walking to be helpful.  Eat a balanced and nourishing diet. This includes plenty of fruits and vegetables, whole grains, and lean proteins.  Do little things that you enjoy. Have a cup of tea, take a bubble bath, read your favorite magazine, or listen to your favorite music.  Avoid alcohol.  Ask for help with household chores, cooking, grocery shopping, or running errands. Do not try to do everything yourself. Consider hiring a postpartum doula to help. This is a professional who specializes in providing support to new mothers.  Try not to make any major life changes during pregnancy or right after giving birth. This can add stress. General instructions  Talk to people close to you about how you are feeling. Get support from your partner, family members, friends, or other new moms. You may want to join a support group.  Find ways to cope with stress. This may include: ? Writing your thoughts and feelings in a journal. ? Spending time outside. ? Spending time with people who make you laugh.  Try to stay positive in how you think. Think about the things you are grateful for.  Take over-the-counter and prescription medicines only as told by your health care provider.  Let your health care provider know if you have any concerns.  Keep all postpartum visits as told by your health care provider. This is important. Contact a health care provider if:  Your baby blues do not go away after 2 weeks. Get help right away if:  You have thoughts of taking your own life (suicidal thoughts).  You think you may harm the baby or other people.  You see or hear things that are not there (hallucinations). Summary  After giving birth, you may  feel happy one minute and sad or stressed the next. Feelings of sadness that happen right after the baby is born and go away after a week or two are called the "baby blues."  You can manage the baby blues by getting enough rest, eating a healthy diet, exercising, spending time with supportive people, and finding ways to cope with stress.  If feelings of sadness and stress last longer than 2 weeks or get in the way of caring for your baby, talk to your health care provider. This may mean you have postpartum depression. This information is not intended to replace advice given to you by your health care provider. Make sure you discuss any questions you  have with your health care provider. Document Released: 04/21/2004 Document Revised: 09/13/2016 Document Reviewed: 09/13/2016 Elsevier Interactive Patient Education  2019 [employer].

## 2018-10-20 ENCOUNTER — Encounter: Payer: Self-pay | Admitting: Certified Nurse Midwife

## 2018-10-20 ENCOUNTER — Other Ambulatory Visit: Payer: Self-pay

## 2018-10-20 ENCOUNTER — Observation Stay
Admission: EM | Admit: 2018-10-20 | Discharge: 2018-10-21 | Disposition: A | Discharge status: Home / Self Care | Payer: Medicaid Other | Attending: Certified Nurse Midwife | Admitting: Certified Nurse Midwife

## 2018-10-20 DIAGNOSIS — O1495 Unspecified pre-eclampsia, complicating the puerperium: Secondary | ICD-10-CM | POA: Diagnosis not present

## 2018-10-20 DIAGNOSIS — R609 Edema, unspecified: Secondary | ICD-10-CM | POA: Insufficient documentation

## 2018-10-20 DIAGNOSIS — R0602 Shortness of breath: Secondary | ICD-10-CM | POA: Diagnosis present

## 2018-10-20 DIAGNOSIS — Z791 Long term (current) use of non-steroidal anti-inflammatories (NSAID): Secondary | ICD-10-CM | POA: Diagnosis not present

## 2018-10-20 DIAGNOSIS — D649 Anemia, unspecified: Secondary | ICD-10-CM | POA: Insufficient documentation

## 2018-10-20 DIAGNOSIS — H538 Other visual disturbances: Secondary | ICD-10-CM | POA: Diagnosis not present

## 2018-10-20 DIAGNOSIS — F419 Anxiety disorder, unspecified: Secondary | ICD-10-CM | POA: Diagnosis not present

## 2018-10-20 DIAGNOSIS — R918 Other nonspecific abnormal finding of lung field: Secondary | ICD-10-CM | POA: Insufficient documentation

## 2018-10-20 DIAGNOSIS — Z79899 Other long term (current) drug therapy: Secondary | ICD-10-CM | POA: Diagnosis not present

## 2018-10-20 DIAGNOSIS — Z8759 Personal history of other complications of pregnancy, childbirth and the puerperium: Secondary | ICD-10-CM

## 2018-10-20 MED ORDER — LORATADINE 10 MG PO TABS
10.0000 mg | ORAL_TABLET | Freq: Every day | ORAL | Status: DC
  Filled 2018-10-20: qty 1

## 2018-10-20 MED ORDER — BENZOCAINE-MENTHOL 20-0.5 % EX AERO
INHALATION_SPRAY | CUTANEOUS | Status: AC
  Administered 2018-10-21: 03:00:00
  Filled 2018-10-20: qty 56

## 2018-10-20 MED ORDER — NIFEDIPINE ER OSMOTIC RELEASE 30 MG PO TB24
30.0000 mg | ORAL_TABLET | Freq: Two times a day (BID) | ORAL | Status: DC
  Administered 2018-10-21: 30 mg via ORAL
  Filled 2018-10-20: qty 1

## 2018-10-20 MED ORDER — OXYCODONE-ACETAMINOPHEN 5-325 MG PO TABS: 1.0000 | ORAL_TABLET | Freq: Four times a day (QID) | ORAL | Status: DC | PRN

## 2018-10-20 MED ORDER — METOCLOPRAMIDE HCL 10 MG PO TABS
10.0000 mg | ORAL_TABLET | Freq: Three times a day (TID) | ORAL | Status: DC | PRN
  Filled 2018-10-20: qty 1

## 2018-10-20 NOTE — Progress Notes (Signed)
Pt. presented to L/D triage 3 days post-partum after previous pre-eclampsia diagnosis and magnesium treatment. She reports blurry vision, increased swelling, shortness of breath, and decreased urine output despite adequate oral intake. She has a temp. Of 100.2 and reports no headache or epigastric pain. 2+ reflexes and no clonus. 2+ and 3+ pitting edema present on the LLE and RLE. Last dose of labetalol was taken at 1930. Last B/P 151/97. Pt. stable at this time. Will continue to monitor.

## 2018-10-21 ENCOUNTER — Observation Stay: Payer: Medicaid Other

## 2018-10-21 DIAGNOSIS — Z8759 Personal history of other complications of pregnancy, childbirth and the puerperium: Secondary | ICD-10-CM

## 2018-10-21 DIAGNOSIS — R0602 Shortness of breath: Secondary | ICD-10-CM | POA: Diagnosis not present

## 2018-10-21 DIAGNOSIS — O1495 Unspecified pre-eclampsia, complicating the puerperium: Secondary | ICD-10-CM | POA: Diagnosis not present

## 2018-10-21 LAB — CBC
HCT: 20.6 % — ABNORMAL LOW (ref 36.0–46.0)
Hemoglobin: 6.4 g/dL — ABNORMAL LOW (ref 12.0–15.0)
MCH: 27.7 pg (ref 26.0–34.0)
MCHC: 31.1 g/dL (ref 30.0–36.0)
MCV: 89.2 fL (ref 80.0–100.0)
Platelets: 153 10*3/uL (ref 150–400)
RBC: 2.31 MIL/uL — ABNORMAL LOW (ref 3.87–5.11)
RDW: 14.4 % (ref 11.5–15.5)
WBC: 12.4 10*3/uL — AB (ref 4.0–10.5)
nRBC: 0.2 % (ref 0.0–0.2)

## 2018-10-21 LAB — URINALYSIS, COMPLETE (UACMP) WITH MICROSCOPIC
BACTERIA UA: NONE SEEN
Bilirubin Urine: NEGATIVE
Glucose, UA: NEGATIVE mg/dL
Ketones, ur: NEGATIVE mg/dL
Nitrite: NEGATIVE
Protein, ur: 30 mg/dL — AB
RBC / HPF: >50 RBC/hpf — ABNORMAL HIGH (ref 0–5)
Specific Gravity, Urine: 1.018 (ref 1.005–1.030)
pH: 6 (ref 5.0–8.0)

## 2018-10-21 LAB — COMPREHENSIVE METABOLIC PANEL
ALT: 18 U/L (ref 0–44)
AST: 36 U/L (ref 15–41)
Albumin: 2.4 g/dL — ABNORMAL LOW (ref 3.5–5.0)
Alkaline Phosphatase: 93 U/L (ref 38–126)
Anion gap: 8 (ref 5–15)
BUN: 16 mg/dL (ref 6–20)
CO2: 23 mmol/L (ref 22–32)
Calcium: 9 mg/dL (ref 8.9–10.3)
Chloride: 108 mmol/L (ref 98–111)
Creatinine, Ser: 0.81 mg/dL (ref 0.44–1.00)
GFR calc Af Amer: >60 mL/min (ref >60)
GFR calc non Af Amer: >60 mL/min (ref >60)
GLUCOSE: 88 mg/dL (ref 70–99)
Potassium: 3.8 mmol/L (ref 3.5–5.1)
Sodium: 139 mmol/L (ref 135–145)
Total Bilirubin: 0.3 mg/dL (ref 0.3–1.2)
Total Protein: 5.3 g/dL — ABNORMAL LOW (ref 6.5–8.1)

## 2018-10-21 MED ORDER — HYDROXYZINE HCL 25 MG PO TABS: 25.0000 mg | ORAL_TABLET | Freq: Four times a day (QID) | ORAL | 2 refills | Status: DC | PRN

## 2018-10-21 MED ORDER — NIFEDIPINE 10 MG PO CAPS: 20.0000 mg | ORAL_CAPSULE | ORAL | Status: DC | PRN

## 2018-10-21 MED ORDER — FUROSEMIDE 10 MG/ML IJ SOLN
20.0000 mg | Freq: Once | INTRAMUSCULAR | Status: AC
  Administered 2018-10-21: 20 mg via INTRAVENOUS
  Filled 2018-10-21: qty 2

## 2018-10-21 MED ORDER — NIFEDIPINE 10 MG PO CAPS
20.0000 mg | ORAL_CAPSULE | ORAL | Status: DC | PRN
  Filled 2018-10-21: qty 2

## 2018-10-21 MED ORDER — LABETALOL HCL 5 MG/ML IV SOLN: 40.0000 mg | INTRAVENOUS | Status: DC | PRN

## 2018-10-21 MED ORDER — NIFEDIPINE 10 MG PO CAPS
10.0000 mg | ORAL_CAPSULE | ORAL | Status: DC | PRN
  Administered 2018-10-21: 10 mg via ORAL
  Filled 2018-10-21: qty 1

## 2018-10-21 MED ORDER — NIFEDIPINE ER 30 MG PO TB24: 30.0000 mg | ORAL_TABLET | Freq: Every day | ORAL | 0 refills | Status: DC

## 2018-10-21 MED ORDER — ESCITALOPRAM OXALATE 10 MG PO TABS: 10.0000 mg | ORAL_TABLET | Freq: Every day | ORAL | 0 refills | Status: DC

## 2018-10-21 MED ORDER — SODIUM CHLORIDE 0.9 % IV SOLN
510.0000 mg | Freq: Once | INTRAVENOUS | Status: AC
  Administered 2018-10-21: 510 mg via INTRAVENOUS
  Filled 2018-10-21: qty 17

## 2018-10-21 NOTE — Progress Notes (Signed)
Nursing supervisor order to place patient on droplet/contact precautions due to low-grade temp of 100.2 and shortness of breath. CNM aware of symptoms. Will continue to monitor.

## 2018-10-21 NOTE — OB Triage Note (Signed)
L&D OB Triage Note  SUBJECTIVE Tamara Bishop is a 23 y.o. G12P1001 female. She is a pre eclamptic 4 days postpartum pt that was treated with magnesium sulfate therapy.  Who presented to triage with complaints of SOB. She states that she thinks it was related to her anxiety, she was having difficulty getting baby latched. On admission to L&D her stats were 91%.   OB History  Gravida Para Term Preterm AB Living  1 1 1  0 0 1  SAB TAB Ectopic Multiple Live Births  0 0 0 0 1    # Outcome Date GA Lbr Len/2nd Weight Sex Delivery Anes PTL Lv  1 Term 10/17/18 [redacted]w[redacted]d / 00:46 2970 g F Vag-Spont EPI  LIV     Name: Davidoff,GIRL Brae     Apgar1: 8  Apgar5: 9    Medications Prior to Admission  Medication Sig Dispense Refill Last Dose  . ferrous sulfate 325 (65 FE) MG tablet Take 1 tablet (325 mg total) by mouth daily with breakfast. 30 tablet 3 10/20/2018 at Unknown time  . hydrochlorothiazide (MICROZIDE) 12.5 MG capsule Take 1 capsule (12.5 mg total) by mouth daily. 4 capsule 0 10/20/2018 at Unknown time  . ibuprofen (ADVIL,MOTRIN) 600 MG tablet Take 1 tablet (600 mg total) by mouth every 6 (six) hours. 30 tablet 0 10/20/2018 at Unknown time  . labetalol (NORMODYNE) 200 MG tablet Take 1 tablet (200 mg total) by mouth 3 (three) times daily. 30 tablet 0 10/20/2018 at 1930  . Prenatal-DSS-FeCb-FeGl-FA (CITRANATAL BLOOM) 90-1 MG TABS Take 1 tablet by mouth daily. 30 tablet 11 10/19/2018 at Unknown time  . Vitamin D, Ergocalciferol, (DRISDOL) 1.25 MG (50000 UT) CAPS capsule Take 1 capsule (50,000 Units total) by mouth 2 (two) times a week. (Patient not taking: Reported on 10/20/2018) 30 capsule 1 Not Taking at Unknown time     OBJECTIVE  Nursing Evaluation:   BP 136/86 (BP Location: Right Arm)   Pulse 89   Temp 99 F (37.2 C) (Oral)   Resp 18   Ht 5\' 1"  (1.549 m)   Wt 85.1 kg   SpO2 95%   BMI 35.45 kg/m    Findings:   CLINICAL DATA:  Three days postpartum, history of preeclampsia, reports  blurred vision, increased swelling, shortness of breath and decreased urine output despite adequate intake  EXAM: CHEST - 2 VIEW  COMPARISON:  None  FINDINGS: Mild enlargement of cardiac silhouette and pulmonary vascular congestion consistent with recent postpartum state.  Mediastinal contours normal.  Scattered interstitial infiltrates in the mid to lower lungs bilaterally which could represent edema or infection.  No pleural effusion or pneumothorax.  Bones unremarkable.  IMPRESSION: Interstitial infiltrates in the mid to lower lungs which could represent pulmonary edema or infection.   Electronically Signed   By: Ulyses Southward M.D.   On: 10/21/2018 00:52  CBC    Component Value Date/Time   WBC 12.4 (H) 10/21/2018 0029   RBC 2.31 (L) 10/21/2018 0029   HGB 6.4 (L) 10/21/2018 0029   HGB 9.5 (L) 10/12/2018 1152   HCT 20.6 (L) 10/21/2018 0029   HCT 29.7 (L) 10/12/2018 1152   PLT 153 10/21/2018 0029   PLT 152 10/12/2018 1152   MCV 89.2 10/21/2018 0029   MCV 84 10/12/2018 1152   MCV 87 05/11/2014 0104   MCH 27.7 10/21/2018 0029   MCHC 31.1 10/21/2018 0029   RDW 14.4 10/21/2018 0029   RDW 13.1 10/12/2018 1152   RDW 13.2 05/11/2014 0104  LYMPHSABS 1.2 09/28/2018 1354   LYMPHSABS 1.1 05/11/2014 0104   MONOABS 0.9 06/18/2018 2134   MONOABS 1.0 (H) 05/11/2014 0104   EOSABS 0.1 09/28/2018 1354   EOSABS 0.1 05/11/2014 0104   BASOSABS 0.0 09/28/2018 1354   BASOSABS 0.0 05/11/2014 0104     ASSESSMENT Impression:  1. 4 D pp, pre eclampsia  2.  Anemia 3.interstitial infiltrates in the mid to lower lungs which could represent pulmonary edema or infection.  PLAN 1. Reassurance given, incentive spirometry, Iron infusion, lasix 20 mg given 2. Pt stating 95-98% on room air, Feeling better requesting to go home.  3. Discharge home with standard labor precautions given to return office as scheduled tomorrow or PRN to hospital.   [name], CNM

## 2018-10-21 NOTE — Progress Notes (Signed)
Discussed POC, interventions, and plan to discharge pt. with A. Valentino Saxon, MD. MD ok with plan to D/C droplet precautions. New orders to begin 2L 02 via nasal cannula and give 20 mg lasix IV. Will continue to monitor.  Infection control ok with plan to D/C precautions.

## 2018-10-21 NOTE — Progress Notes (Signed)
Completed feraheme 510 mg and discussed discharge order with CNM.CNM notified of pt clinical data: SpO2 91% on room air, pt. still experiencing "intermittent elephant sitting on chest" which the patient attributes to anxiety, normalized blood pressures after Procardia-XL 30 mg and Procardia 10 mg, temp 98.7, and sufficient urine output.Patient prescribed Vistaril and Lexapro for anxiety and ok with this plan of care.  No new orders at this time. Patient stable. Will follow up with MD and Infection Control about continuing droplet precaution protocol with negative respiratory panel and clinical data.  Pt. does state that she was was initially opposed to blood products because they "sounded gross" but would accept them if needed at any point in her future care.

## 2018-10-21 NOTE — Progress Notes (Signed)
Pt. educated on incentive spirometry use per CNM order. Will continue to monitor.

## 2018-10-22 ENCOUNTER — Encounter: Payer: Self-pay | Admitting: Certified Nurse Midwife

## 2018-10-22 ENCOUNTER — Other Ambulatory Visit: Payer: Self-pay

## 2018-10-22 ENCOUNTER — Ambulatory Visit (INDEPENDENT_AMBULATORY_CARE_PROVIDER_SITE_OTHER): Payer: Medicaid Other | Admitting: Certified Nurse Midwife

## 2018-10-22 VITALS — BP 148/96 | HR 88 | Ht 62.0 in | Wt 170.5 lb

## 2018-10-22 DIAGNOSIS — Z0130 Encounter for examination of blood pressure without abnormal findings: Secondary | ICD-10-CM

## 2018-10-22 DIAGNOSIS — Z8759 Personal history of other complications of pregnancy, childbirth and the puerperium: Secondary | ICD-10-CM | POA: Diagnosis not present

## 2018-10-22 LAB — URINE CULTURE: CULTURE: NO GROWTH

## 2018-10-22 NOTE — Patient Instructions (Signed)
Postpartum Hypertension  Postpartum hypertension is high blood pressure that remains higher than normal after childbirth. You may not realize that you have postpartum hypertension if your blood pressure is not being checked regularly. In most cases, postpartum hypertension will go away on its own, usually within a week of delivery. However, for some women, medical treatment is required to prevent serious complications, such as seizures or stroke.  What are the causes?  This condition may be caused by one or more of the following:   Hypertension that existed before pregnancy (chronic hypertension).   Hypertension that comes on as a result of pregnancy (gestational hypertension).   Hypertensive disorders during pregnancy (preeclampsia) or seizures in women who have high blood pressure during pregnancy (eclampsia).   A condition in which the liver, platelets, and red blood cells are damaged during pregnancy (HELLP syndrome).   A condition in which the thyroid produces too much hormones (hyperthyroidism).   Other rare problems of the nerves (neurological disorders) or blood disorders.  In some cases, the cause may not be known.  What increases the risk?  The following factors may make you more likely to develop this condition:   Chronic hypertension. In some cases, this may not have been diagnosed before pregnancy.   Obesity.   Type 2 diabetes.   Kidney disease.   History of preeclampsia or eclampsia.   Other medical conditions that change the level of hormones in the body (hormonal imbalance).  What are the signs or symptoms?  As with all types of hypertension, postpartum hypertension may not have any symptoms. Depending on how high your blood pressure is, you may experience:   Headaches. These may be mild, moderate, or severe. They may also be steady, constant, or sudden in onset (thunderclap headache).   Changes in your ability to see (visual changes).   Dizziness.   Shortness of breath.   Swelling  of your hands, feet, lower legs, or face. In some cases, you may have swelling in more than one of these locations.   Heart palpitations or a racing heartbeat.   Difficulty breathing while lying down.   Decrease in the amount of urine that you pass.  Other rare signs and symptoms may include:   Sweating more than usual. This lasts longer than a few days after delivery.   Chest pain.   Sudden dizziness when you get up from sitting or lying down.   Seizures.   Nausea or vomiting.   Abdominal pain.  How is this diagnosed?  This condition may be diagnosed based on the results of a physical exam, blood pressure measurements, and blood and urine tests.  You may also have other tests, such as a CT scan or an MRI, to check for other problems of postpartum hypertension.  How is this treated?  If blood pressure is high enough to require treatment, your options may include:   Medicines to reduce blood pressure (antihypertensives). Tell your health care provider if you are breastfeeding or if you plan to breastfeed. There are many antihypertensive medicines that are safe to take while breastfeeding.   Stopping medicines that may be causing hypertension.   Treating medical conditions that are causing hypertension.   Treating the complications of hypertension, such as seizures, stroke, or kidney problems.  Your health care provider will also continue to monitor your blood pressure closely until it is within a safe range for you.  Follow these instructions at home:   Take over-the-counter and prescription medicines only as   told by your health care provider.   Return to your normal activities as told by your health care provider. Ask your health care provider what activities are safe for you.   Do not use any products that contain nicotine or tobacco, such as cigarettes and e-cigarettes. If you need help quitting, ask your health care provider.   Keep all follow-up visits as told by your health care provider. This  is important.  Contact a health care provider if:   Your symptoms get worse.   You have new symptoms, such as:  ? A headache that does not get better.  ? Dizziness.  ? Visual changes.  Get help right away if:   You suddenly develop swelling in your hands, ankles, or face.   You have sudden, rapid weight gain.   You develop difficulty breathing, chest pain, racing heartbeat, or heart palpitations.   You develop severe pain in your abdomen.   You have any symptoms of a stroke. "BE FAST" is an easy way to remember the main warning signs of a stroke:  ? B - Balance. Signs are dizziness, sudden trouble walking, or loss of balance.  ? E - Eyes. Signs are trouble seeing or a sudden change in vision.  ? F - Face. Signs are sudden weakness or numbness of the face, or the face or eyelid drooping on one side.  ? A - Arms. Signs are weakness or numbness in an arm. This happens suddenly and usually on one side of the body.  ? S - Speech. Signs are sudden trouble speaking, slurred speech, or trouble understanding what people say.  ? T - Time. Time to call emergency services. Write down what time symptoms started.   You have other signs of a stroke, such as:  ? A sudden, severe headache with no known cause.  ? Nausea or vomiting.  ? Seizure.  These symptoms may represent a serious problem that is an emergency. Do not wait to see if the symptoms will go away. Get medical help right away. Call your local emergency services (911 in the [place] Do not drive yourself to the hospital.  Summary   Postpartum hypertension is high blood pressure that remains higher than normal after childbirth.   In most cases, postpartum hypertension will go away on its own, usually within a week of delivery.   For some women, medical treatment is required to prevent serious complications, such as seizures or stroke.  This information is not intended to replace advice given to you by your health care provider. Make sure you discuss any questions  you have with your health care provider.  Document Released: 03/21/2014 Document Revised: 05/08/2017 Document Reviewed: 05/08/2017  Elsevier Interactive Patient Education  2019 [employer]

## 2018-10-22 NOTE — Progress Notes (Signed)
GYN ENCOUNTER NOTE  Subjective:       Tamara Bishop is a 23 y.o. G30P1001 female here for postpartum blood pressure check after observation visit at hospital and medication change.   Started Procardia on Sunday night. Reports weight and fluid loss since resting all day yesterday.   Producing approximately four (4) ounces of milk after each feeding.   Denies difficulty breathing or respiratory distress, chest pain, abdominal pain, excessive vaginal bleeding, dysuria, and leg pain or swelling.   Gynecologic History  No LMP recorded.   Contraception: abstinence  Last Pap: 04/2018. Results were: LSIL  Obstetric History  OB History  Gravida Para Term Preterm AB Living  1 1 1  0 0 1  SAB TAB Ectopic Multiple Live Births  0 0 0 0 1    # Outcome Date GA Lbr Len/2nd Weight Sex Delivery Anes PTL Lv  1 Term 10/17/18 [redacted]w[redacted]d / 00:46 6 lb 8.8 oz (2.97 kg) F Vag-Spont EPI  LIV    Past Medical History:  Diagnosis Date  . Adjustment disorder with anxiety   . Allergic rhinitis   . Anxiety   . Cigarette smoker   . Depression   . Insomnia   . Tobacco abuse     Past Surgical History:  Procedure Laterality Date  . NO PAST SURGERIES    . WISDOM TOOTH EXTRACTION      Current Outpatient Medications on File Prior to Visit  Medication Sig Dispense Refill  . escitalopram (LEXAPRO) 10 MG tablet Take 1 tablet (10 mg total) by mouth daily. 30 tablet 0  . ferrous sulfate 325 (65 FE) MG tablet Take 1 tablet (325 mg total) by mouth daily with breakfast. 30 tablet 3  . hydrochlorothiazide (MICROZIDE) 12.5 MG capsule Take 1 capsule (12.5 mg total) by mouth daily. 4 capsule 0  . ibuprofen (ADVIL,MOTRIN) 600 MG tablet Take 1 tablet (600 mg total) by mouth every 6 (six) hours. 30 tablet 0  . NIFEdipine (ADALAT CC) 30 MG 24 hr tablet Take 1 tablet (30 mg total) by mouth daily. 30 tablet 0  . hydrOXYzine (ATARAX/VISTARIL) 25 MG tablet Take 1 tablet (25 mg total) by mouth every 6 (six) hours as  needed for itching. (Patient not taking: Reported on 10/22/2018) 30 tablet 2  . Vitamin D, Ergocalciferol, (DRISDOL) 1.25 MG (50000 UT) CAPS capsule Take 1 capsule (50,000 Units total) by mouth 2 (two) times a week. (Patient not taking: Reported on 10/20/2018) 30 capsule 1   No current facility-administered medications on file prior to visit.     Allergies  Allergen Reactions  . Azithromycin Other (See Comments)    Felt like her stomach was going to come out of her body    Social History   Socioeconomic History  . Marital status: Significant Other    Spouse name: Ernestina Patches  . Number of children: Not on file  . Years of education: Not on file  . Highest education level: Not on file  Occupational History  . Not on file  Social Needs  . Financial resource strain: Not hard at all  . Food insecurity:    Worry: Never true    Inability: Never true  . Transportation needs:    Medical: No    Non-medical: No  Tobacco Use  . Smoking status: Former Smoker    Packs/day: 0.50    Years: 1.00    Pack years: 0.50    Types: Cigarettes  . Smokeless tobacco: Never Used  Substance and Sexual  Activity  . Alcohol use: No  . Drug use: No  . Sexual activity: Yes    Birth control/protection: Pill  Lifestyle  . Physical activity:    Days per week: 5 days    Minutes per session: Not on file  . Stress: Not at all  Relationships  . Social connections:    Talks on phone: More than three times a week    Gets together: More than three times a week    Attends religious service: More than 4 times per year    Active member of club or organization: Yes    Attends meetings of clubs or organizations: Never    Relationship status: Living with partner  . Intimate partner violence:    Fear of current or ex partner: No    Emotionally abused: No    Physically abused: No    Forced sexual activity: No  Other Topics Concern  . Not on file  Social History Narrative  . Not on file    Family History   Problem Relation Age of Onset  . Lupus Mother   . Diabetes Father   . Breast cancer Maternal Grandmother   . Cancer Maternal Grandmother         breast  . Diabetes Paternal Grandmother   . Diabetes Paternal Grandfather   . Lupus Sister   . Heart disease Maternal Grandfather     The following portions of the patient's history were reviewed and updated as appropriate: allergies, current medications, past family history, past medical history, past social history, past surgical history and problem list.  Review of Systems  ROS negative except as noted above. Information obtained from patient.   Objective:   BP (!) 148/96   Pulse 88   Ht 5\' 2"  (1.575 m)   Wt 170 lb 8 oz (77.3 kg)   SpO2 99%   Breastfeeding Yes   BMI 31.18 kg/m    CONSTITUTIONAL: Well-developed, well-nourished female in no acute distress.    GAD 7 : Generalized Anxiety Score 10/22/2018 08/17/2018  Nervous, Anxious, on Edge 1 1  Control/stop worrying 1 3  Worry too much - different things 1 3  Trouble relaxing 1 1  Restless 1 1  Easily annoyed or irritable 0 0  Afraid - awful might happen 1 1  Total GAD 7 Score 6 10  Anxiety Difficulty Not difficult at all Not difficult at all   Assessment:   1. Blood pressure check   2. History of severe pre-eclampsia   Plan:   Continue medications as prescribed.   Reviewed red flag symptoms and when to call.   RTC x Friday for blood pressure check or sooner if needed.    Gunnar Bulla, CNM [facility], [facility] 10/22/18 1:12 PM

## 2018-10-25 NOTE — Progress Notes (Signed)
Coronavirus (COVID-19) Are you at risk?  Are you at risk for the Coronavirus (COVID-19)?  To be considered HIGH RISK for Coronavirus (COVID-19), you have to meet the following criteria:  . Traveled to [place], [place], [place], [place] or [place]; or in the [place] to [place], [place], [place], or [place]; and have fever, cough, and shortness of breath within the last 2 weeks of travel OR . Been in close contact with a person diagnosed with COVID-19 within the last 2 weeks and have fever, cough, and shortness of breath . IF YOU DO NOT MEET THESE CRITERIA, YOU ARE CONSIDERED LOW RISK FOR COVID-19.  What to do if you are HIGH RISK for COVID-19?  . If you are having a medical emergency, call 911. . Seek medical care right away. Before you go to a doctor's office, urgent care or emergency department, call ahead and tell them about your recent travel, contact with someone diagnosed with COVID-19, and your symptoms. You should receive instructions from your physician's office regarding next steps of care.  . When you arrive at healthcare provider, tell the healthcare staff immediately you have returned from visiting [place], [place], [place], [place] or [place]; or traveled in the [place] to [place], [place], [place], or [place]; in the last two weeks or you have been in close contact with a person diagnosed with COVID-19 in the last 2 weeks.   . Tell the health care staff about your symptoms: fever, cough and shortness of breath. . After you have been seen by a medical provider, you will be either: o Tested for (COVID-19) and discharged home on quarantine except to seek medical care if symptoms worsen, and asked to  - Stay home and avoid contact with others until you get your results (4-5 days)  - Avoid travel on public transportation if possible (such as bus, train, or airplane) or o Sent to the Emergency Department by EMS for evaluation, COVID-19 testing, and possible  admission depending on your condition and test results.  What to do if you are LOW RISK for COVID-19?  Reduce your risk of any infection by using the same precautions used for avoiding the common cold or flu:  . Wash your hands often with soap and warm water for at least 20 seconds.  If soap and water are not readily available, use an alcohol-based hand sanitizer with at least 60% alcohol.  . If coughing or sneezing, cover your mouth and nose by coughing or sneezing into the elbow areas of your shirt or coat, into a tissue or into your sleeve (not your hands). . Avoid shaking hands with others and consider head nods or verbal greetings only. . Avoid touching your eyes, nose, or mouth with unwashed hands.  . Avoid close contact with people who are sick. . Avoid places or events with large numbers of people in one location, like concerts or sporting events. . Carefully consider travel plans you have or are making. . If you are planning any travel outside or inside the [place], visit the CDC's Travelers' Health webpage for the latest health notices. . If you have some symptoms but not all symptoms, continue to monitor at home and seek medical attention if your symptoms worsen. . If you are having a medical emergency, call 911.  Spoke with pt denies any sx. Amy Clontz, CMA   ADDITIONAL HEALTHCARE OPTIONS FOR PATIENTS  [facility] Telehealth / e-Visit: [email or web address]           [facility]: [phone]  [facility]: [phone]                   [facility]: [phone]  

## 2018-10-26 ENCOUNTER — Other Ambulatory Visit: Payer: Self-pay

## 2018-10-26 ENCOUNTER — Encounter: Payer: Self-pay | Admitting: Certified Nurse Midwife

## 2018-10-26 ENCOUNTER — Ambulatory Visit (INDEPENDENT_AMBULATORY_CARE_PROVIDER_SITE_OTHER): Payer: Medicaid Other | Admitting: Certified Nurse Midwife

## 2018-10-26 VITALS — BP 140/86 | HR 76 | Wt 161.8 lb

## 2018-10-26 DIAGNOSIS — Z8759 Personal history of other complications of pregnancy, childbirth and the puerperium: Secondary | ICD-10-CM | POA: Diagnosis not present

## 2018-10-26 DIAGNOSIS — Z0130 Encounter for examination of blood pressure without abnormal findings: Secondary | ICD-10-CM | POA: Diagnosis not present

## 2018-10-26 NOTE — Progress Notes (Signed)
GYN ENCOUNTER NOTE  Subjective:       Tamara Bishop is a 23 y.o. G73P1001 female here for blood pressure check.   Taking Procardia daily. No side effects. Nursing infant without difficulty.   Denies difficulty breathing or respiratory distress, chest pain, abdominal pain, excessive vaginal bleeding, dysuria, and leg pain or swelling.    Gynecologic History  No LMP recorded.  Contraception: abstinence  Last Pap: 04/2018. Results were: LSIL  Obstetric History  OB History  Gravida Para Term Preterm AB Living  1 1 1  0 0 1  SAB TAB Ectopic Multiple Live Births  0 0 0 0 1    # Outcome Date GA Lbr Len/2nd Weight Sex Delivery Anes PTL Lv  1 Term 10/17/18 [redacted]w[redacted]d / 00:46 6 lb 8.8 oz (2.97 kg) F Vag-Spont EPI  LIV    Past Medical History:  Diagnosis Date  . Adjustment disorder with anxiety   . Allergic rhinitis   . Anxiety   . Cigarette smoker   . Depression   . Insomnia   . Tobacco abuse     Past Surgical History:  Procedure Laterality Date  . NO PAST SURGERIES    . WISDOM TOOTH EXTRACTION      Current Outpatient Medications on File Prior to Visit  Medication Sig Dispense Refill  . escitalopram (LEXAPRO) 10 MG tablet Take 1 tablet (10 mg total) by mouth daily. 30 tablet 0  . ferrous sulfate 325 (65 FE) MG tablet Take 1 tablet (325 mg total) by mouth daily with breakfast. 30 tablet 3  . ibuprofen (ADVIL,MOTRIN) 600 MG tablet Take 1 tablet (600 mg total) by mouth every 6 (six) hours. 30 tablet 0  . NIFEdipine (ADALAT CC) 30 MG 24 hr tablet Take 1 tablet (30 mg total) by mouth daily. 30 tablet 0  . hydrochlorothiazide (MICROZIDE) 12.5 MG capsule Take 1 capsule (12.5 mg total) by mouth daily. (Patient not taking: Reported on 10/26/2018) 4 capsule 0  . hydrOXYzine (ATARAX/VISTARIL) 25 MG tablet Take 1 tablet (25 mg total) by mouth every 6 (six) hours as needed for itching. (Patient not taking: Reported on 10/22/2018) 30 tablet 2  . Vitamin D, Ergocalciferol, (DRISDOL) 1.25 MG  (50000 UT) CAPS capsule Take 1 capsule (50,000 Units total) by mouth 2 (two) times a week. (Patient not taking: Reported on 10/20/2018) 30 capsule 1   No current facility-administered medications on file prior to visit.     Allergies  Allergen Reactions  . Azithromycin Other (See Comments)    Felt like her stomach was going to come out of her body    Social History   Socioeconomic History  . Marital status: Significant Other    Spouse name: Ernestina Patches  . Number of children: Not on file  . Years of education: Not on file  . Highest education level: Not on file  Occupational History  . Not on file  Social Needs  . Financial resource strain: Not hard at all  . Food insecurity:    Worry: Never true    Inability: Never true  . Transportation needs:    Medical: No    Non-medical: No  Tobacco Use  . Smoking status: Former Smoker    Packs/day: 0.50    Years: 1.00    Pack years: 0.50    Types: Cigarettes  . Smokeless tobacco: Never Used  Substance and Sexual Activity  . Alcohol use: No  . Drug use: No  . Sexual activity: Not Currently  Lifestyle  .  Physical activity:    Days per week: 5 days    Minutes per session: Not on file  . Stress: Not at all  Relationships  . Social connections:    Talks on phone: More than three times a week    Gets together: More than three times a week    Attends religious service: More than 4 times per year    Active member of club or organization: Yes    Attends meetings of clubs or organizations: Never    Relationship status: Living with partner  . Intimate partner violence:    Fear of current or ex partner: No    Emotionally abused: No    Physically abused: No    Forced sexual activity: No  Other Topics Concern  . Not on file  Social History Narrative  . Not on file    Family History  Problem Relation Age of Onset  . Lupus Mother   . Diabetes Father   . Breast cancer Maternal Grandmother   . Cancer Maternal Grandmother          breast  . Diabetes Paternal Grandmother   . Diabetes Paternal Grandfather   . Lupus Sister   . Heart disease Maternal Grandfather     The following portions of the patient's history were reviewed and updated as appropriate: allergies, current medications, past family history, past medical history, past social history, past surgical history and problem list.  Review of Systems  ROS negative except as noted above. Information obtained from patient.   Objective:   BP 140/86   Pulse 76   Wt 161 lb 12.8 oz (73.4 kg)   Breastfeeding Yes   BMI 29.59 kg/m    CONSTITUTIONAL: Well-developed, well-nourished female in no acute distress.   MUSCULOSKELETAL: Normal range of motion. No tenderness.  No cyanosis, clubbing, or edema.  Assessment:   1. Blood pressure check  2. History of severe pre-eclampsia  Plan:   Continue medications as prescribed.   Reviewed red flag symptoms and when to call.   RTC x 4 weeks for postpartum visit or sooner if needed.    Gunnar Bulla, CNM [facility], [facility]

## 2018-10-26 NOTE — Progress Notes (Signed)
Pt is present today for bp check. Pt's bp was 140/86 P 76.

## 2018-10-26 NOTE — Patient Instructions (Signed)
Postpartum Hypertension  Postpartum hypertension is high blood pressure that remains higher than normal after childbirth. You may not realize that you have postpartum hypertension if your blood pressure is not being checked regularly. In most cases, postpartum hypertension will go away on its own, usually within a week of delivery. However, for some women, medical treatment is required to prevent serious complications, such as seizures or stroke.  What are the causes?  This condition may be caused by one or more of the following:   Hypertension that existed before pregnancy (chronic hypertension).   Hypertension that comes on as a result of pregnancy (gestational hypertension).   Hypertensive disorders during pregnancy (preeclampsia) or seizures in women who have high blood pressure during pregnancy (eclampsia).   A condition in which the liver, platelets, and red blood cells are damaged during pregnancy (HELLP syndrome).   A condition in which the thyroid produces too much hormones (hyperthyroidism).   Other rare problems of the nerves (neurological disorders) or blood disorders.  In some cases, the cause may not be known.  What increases the risk?  The following factors may make you more likely to develop this condition:   Chronic hypertension. In some cases, this may not have been diagnosed before pregnancy.   Obesity.   Type 2 diabetes.   Kidney disease.   History of preeclampsia or eclampsia.   Other medical conditions that change the level of hormones in the body (hormonal imbalance).  What are the signs or symptoms?  As with all types of hypertension, postpartum hypertension may not have any symptoms. Depending on how high your blood pressure is, you may experience:   Headaches. These may be mild, moderate, or severe. They may also be steady, constant, or sudden in onset (thunderclap headache).   Changes in your ability to see (visual changes).   Dizziness.   Shortness of breath.   Swelling  of your hands, feet, lower legs, or face. In some cases, you may have swelling in more than one of these locations.   Heart palpitations or a racing heartbeat.   Difficulty breathing while lying down.   Decrease in the amount of urine that you pass.  Other rare signs and symptoms may include:   Sweating more than usual. This lasts longer than a few days after delivery.   Chest pain.   Sudden dizziness when you get up from sitting or lying down.   Seizures.   Nausea or vomiting.   Abdominal pain.  How is this diagnosed?  This condition may be diagnosed based on the results of a physical exam, blood pressure measurements, and blood and urine tests.  You may also have other tests, such as a CT scan or an MRI, to check for other problems of postpartum hypertension.  How is this treated?  If blood pressure is high enough to require treatment, your options may include:   Medicines to reduce blood pressure (antihypertensives). Tell your health care provider if you are breastfeeding or if you plan to breastfeed. There are many antihypertensive medicines that are safe to take while breastfeeding.   Stopping medicines that may be causing hypertension.   Treating medical conditions that are causing hypertension.   Treating the complications of hypertension, such as seizures, stroke, or kidney problems.  Your health care provider will also continue to monitor your blood pressure closely until it is within a safe range for you.  Follow these instructions at home:   Take over-the-counter and prescription medicines only as   told by your health care provider.   Return to your normal activities as told by your health care provider. Ask your health care provider what activities are safe for you.   Do not use any products that contain nicotine or tobacco, such as cigarettes and e-cigarettes. If you need help quitting, ask your health care provider.   Keep all follow-up visits as told by your health care provider. This  is important.  Contact a health care provider if:   Your symptoms get worse.   You have new symptoms, such as:  ? A headache that does not get better.  ? Dizziness.  ? Visual changes.  Get help right away if:   You suddenly develop swelling in your hands, ankles, or face.   You have sudden, rapid weight gain.   You develop difficulty breathing, chest pain, racing heartbeat, or heart palpitations.   You develop severe pain in your abdomen.   You have any symptoms of a stroke. "BE FAST" is an easy way to remember the main warning signs of a stroke:  ? B - Balance. Signs are dizziness, sudden trouble walking, or loss of balance.  ? E - Eyes. Signs are trouble seeing or a sudden change in vision.  ? F - Face. Signs are sudden weakness or numbness of the face, or the face or eyelid drooping on one side.  ? A - Arms. Signs are weakness or numbness in an arm. This happens suddenly and usually on one side of the body.  ? S - Speech. Signs are sudden trouble speaking, slurred speech, or trouble understanding what people say.  ? T - Time. Time to call emergency services. Write down what time symptoms started.   You have other signs of a stroke, such as:  ? A sudden, severe headache with no known cause.  ? Nausea or vomiting.  ? Seizure.  These symptoms may represent a serious problem that is an emergency. Do not wait to see if the symptoms will go away. Get medical help right away. Call your local emergency services (911 in the [place] Do not drive yourself to the hospital.  Summary   Postpartum hypertension is high blood pressure that remains higher than normal after childbirth.   In most cases, postpartum hypertension will go away on its own, usually within a week of delivery.   For some women, medical treatment is required to prevent serious complications, such as seizures or stroke.  This information is not intended to replace advice given to you by your health care provider. Make sure you discuss any questions  you have with your health care provider.  Document Released: 03/21/2014 Document Revised: 05/08/2017 Document Reviewed: 05/08/2017  Elsevier Interactive Patient Education  2019 [employer]

## 2018-11-14 ENCOUNTER — Encounter: Payer: Self-pay | Admitting: Certified Nurse Midwife

## 2018-11-15 ENCOUNTER — Encounter: Payer: Self-pay | Admitting: Certified Nurse Midwife

## 2018-11-22 ENCOUNTER — Telehealth: Payer: Self-pay | Admitting: *Deleted

## 2018-11-22 NOTE — Telephone Encounter (Signed)
Coronavirus (COVID-19) Are you at risk?  Are you at risk for the Coronavirus (COVID-19)?  To be considered HIGH RISK for Coronavirus (COVID-19), you have to meet the following criteria:  . Traveled to [place], [place], [place], [place] or [place]; or in the [place] to [place], [place], [place], or [place]; and have fever, cough, and shortness of breath within the last 2 weeks of travel OR . Been in close contact with a person diagnosed with COVID-19 within the last 2 weeks and have fever, cough, and shortness of breath . IF YOU DO NOT MEET THESE CRITERIA, YOU ARE CONSIDERED LOW RISK FOR COVID-19.  What to do if you are HIGH RISK for COVID-19?  . If you are having a medical emergency, call 911. . Seek medical care right away. Before you go to a doctor's office, urgent care or emergency department, call ahead and tell them about your recent travel, contact with someone diagnosed with COVID-19, and your symptoms. You should receive instructions from your physician's office regarding next steps of care.  . When you arrive at healthcare provider, tell the healthcare staff immediately you have returned from visiting [place], [place], [place], [place] or [place]; or traveled in the [place] to [place], [place], [place], or [place]; in the last two weeks or you have been in close contact with a person diagnosed with COVID-19 in the last 2 weeks.   . Tell the health care staff about your symptoms: fever, cough and shortness of breath. . After you have been seen by a medical provider, you will be either: o Tested for (COVID-19) and discharged home on quarantine except to seek medical care if symptoms worsen, and asked to  - Stay home and avoid contact with others until you get your results (4-5 days)  - Avoid travel on public transportation if possible (such as bus, train, or airplane) or o Sent to the Emergency Department by EMS for evaluation, COVID-19 testing, and possible  admission depending on your condition and test results.  What to do if you are LOW RISK for COVID-19?  Reduce your risk of any infection by using the same precautions used for avoiding the common cold or flu:  . Wash your hands often with soap and warm water for at least 20 seconds.  If soap and water are not readily available, use an alcohol-based hand sanitizer with at least 60% alcohol.  . If coughing or sneezing, cover your mouth and nose by coughing or sneezing into the elbow areas of your shirt or coat, into a tissue or into your sleeve (not your hands). . Avoid shaking hands with others and consider head nods or verbal greetings only. . Avoid touching your eyes, nose, or mouth with unwashed hands.  . Avoid close contact with people who are sick. . Avoid places or events with large numbers of people in one location, like concerts or sporting events. . Carefully consider travel plans you have or are making. . If you are planning any travel outside or inside the [place], visit the CDC's Travelers' Health webpage for the latest health notices. . If you have some symptoms but not all symptoms, continue to monitor at home and seek medical attention if your symptoms worsen. . If you are having a medical emergency, call 911.   ADDITIONAL HEALTHCARE OPTIONS FOR PATIENTS  [facility] / e-Visit: [email or web address]         [facility]: [phone]  [facility]: [phone]                   [facility]: [phone]   Spoke with pt denies any sx.  Cortavious Nix, CMA 

## 2018-11-23 ENCOUNTER — Encounter: Payer: Self-pay | Admitting: Certified Nurse Midwife

## 2018-11-23 ENCOUNTER — Other Ambulatory Visit: Payer: Self-pay

## 2018-11-23 ENCOUNTER — Ambulatory Visit (INDEPENDENT_AMBULATORY_CARE_PROVIDER_SITE_OTHER): Payer: Medicaid Other | Admitting: Certified Nurse Midwife

## 2018-11-23 ENCOUNTER — Encounter: Payer: Medicaid Other | Admitting: Certified Nurse Midwife

## 2018-11-23 DIAGNOSIS — B370 Candidal stomatitis: Secondary | ICD-10-CM

## 2018-11-23 DIAGNOSIS — O9229 Other disorders of breast associated with pregnancy and the puerperium: Secondary | ICD-10-CM

## 2018-11-23 MED ORDER — CLINDAMYCIN HCL 300 MG PO CAPS: 300.0000 mg | ORAL_CAPSULE | Freq: Three times a day (TID) | ORAL | 0 refills | Status: AC

## 2018-11-23 MED ORDER — DROSPIRENONE 4 MG PO TABS: 1.0000 | ORAL_TABLET | Freq: Every day | ORAL | 4 refills | Status: DC

## 2018-11-23 MED ORDER — FLUCONAZOLE 150 MG PO TABS: 150.0000 mg | ORAL_TABLET | Freq: Every day | ORAL | 0 refills | Status: DC

## 2018-11-23 NOTE — Patient Instructions (Signed)
Drospirenone tablets (contraception) What is this medicine? DROSPIRENONE (dro SPY re nown) is an oral contraceptive (birth control pill). The product contains a female hormone known as a progestin. It is used to prevent pregnancy. This medicine may be used for other purposes; ask your health care provider or pharmacist if you have questions. COMMON BRAND NAME(S): SLYND What should I tell my health care provider before I take this medicine? They need to know if you have any of these conditions: -abnormal vaginal bleeding -adrenal gland disease -blood vessel disease or blood clots -breast, cervical, endometrial, ovarian, liver, or uterine cancer -diabetes -heart disease or recent heart attack -high potassium level -kidney disease -liver disease -mental depression -migraine headaches -stroke -an unusual or allergic reaction to drospirenone, progestins, or other medicines, foods, dyes, or preservatives -pregnant or trying to get pregnant -breast-feeding How should I use this medicine? Take this medicine by mouth. To reduce nausea, this medicine may be taken with food. Follow the directions on the prescription label. Take this medicine at the same time each day and in the order directed on the package. Do not take your medicine more often than directed. A patient package insert for the product will be given with each prescription and refill. Read this sheet carefully each time. The sheet may change frequently. Talk to your pediatrician regarding the use of this medicine in children. Special care may be needed. This medicine has been used in female children who have started having menstrual periods. Overdosage: If you think you have taken too much of this medicine contact a poison control center or emergency room at once. NOTE: This medicine is only for you. Do not share this medicine with others. What if I miss a dose? If you miss a dose, take it as soon as you can and refer to the patient  information sheet you received with your medicine for direction. If you miss more than one pill, this medicine may not be as effective and you may need to use another form of birth control. What may interact with this medicine? Do not take this medicine with any of the following medications: -atazanavir; cobicistat -bosentan -fosamprenavir This medicine may also interact with the following medications: -aprepitant -barbiturates like phenobarbital, primidone -carbamazepine -certain antibiotics like clarithromycin, rifampin, rifabutin, rifapentine -certain antivirals for HIV or hepatitis -certain diuretics like amiloride, spironolactone, triamterene -certain medicines for fungal infections like griseofulvin, ketoconazole, itraconazole, voriconazole -certain medicines for blood pressure, heart disease -cyclosporine -felbamate -heparin -medicines for diabetes -modafinil -NSAIDs, medicines for pain and inflammation, like ibuprofen or naproxen -oxcarbazepine -phenytoin -potassium supplements -rufinamide -St. John's wort -topiramate This list may not describe all possible interactions. Give your health care provider a list of all the medicines, herbs, non-prescription drugs, or dietary supplements you use. Also tell them if you smoke, drink alcohol, or use illegal drugs. Some items may interact with your medicine. What should I watch for while using this medicine? Visit your doctor or health care professional for regular checks on your progress. You will need a regular breast and pelvic exam and Pap smear while on this medicine. You may need blood work done while you are taking this medicine. If you have any reason to think you are pregnant, stop taking this medicine right away and contact your doctor or health care professional. This medicine does not protect you against HIV infection (AIDS) or any other sexually transmitted diseases. If you are going to have elective surgery, you may need  to stop taking this medicine before  the surgery. Consult your health care professional for advice. What side effects may I notice from receiving this medicine? Side effects that you should report to your doctor or health care professional as soon as possible: -allergic reactions like skin rash, itching or hives, swelling of the face, lips, or tongue -breast tissue changes or discharge -depressed mood -severe pain, swelling, or tenderness in the abdomen -signs and symptoms of a blood clot such as chest pain; shortness of breath; pain, swelling, or warmth in the leg -signs and symptoms of increased potassium like muscle weakness; chest pain; or fast, irregular heartbeat -signs and symptoms of liver injury like dark yellow or brown urine; general ill feeling or flu-like symptoms; light-colored stools; loss of appetite; nausea; right upper belly pain; unusually weak or tired; yellowing of the eyes or skin -signs and symptoms of a stroke like changes in vision; confusion; trouble speaking or understanding; severe headaches; sudden numbness or weakness of the face, arm or leg; trouble walking; dizziness; loss of balance or coordination -unusual vaginal bleeding -unusually weak or tired Side effects that usually do not require medical attention (report these to your doctor or health care professional if they continue or are bothersome): -acne -breast tenderness -headache -menstrual cramps -nausea -weight gain This list may not describe all possible side effects. Call your doctor for medical advice about side effects. You may report side effects to FDA at 1-800-FDA-1088. Where should I keep my medicine? Keep out of the reach of children. Store at room temperature between 20 and 25 degrees C (68 and 77 degrees F). Throw away any unused medicine after the expiration date. NOTE: This sheet is a summary. It may not cover all possible information. If you have questions about this medicine, talk to your  doctor, pharmacist, or health care provider.  2019 Elsevier/Gold Standard (2017-12-27 15:01:56)   Preventive Care 18-39 Years, Female Preventive care refers to lifestyle choices and visits with your health care provider that can promote health and wellness. What does preventive care include?   A yearly physical exam. This is also called an annual well check.  Dental exams once or twice a year.  Routine eye exams. Ask your health care provider how often you should have your eyes checked.  Personal lifestyle choices, including: ? Daily care of your teeth and gums. ? Regular physical activity. ? Eating a healthy diet. ? Avoiding tobacco and drug use. ? Limiting alcohol use. ? Practicing safe sex. ? Taking vitamin and mineral supplements as recommended by your health care provider. What happens during an annual well check? The services and screenings done by your health care provider during your annual well check will depend on your age, overall health, lifestyle risk factors, and family history of disease. Counseling Your health care provider may ask you questions about your:  Alcohol use.  Tobacco use.  Drug use.  Emotional well-being.  Home and relationship well-being.  Sexual activity.  Eating habits.  Work and work Statistician.  Method of birth control.  Menstrual cycle.  Pregnancy history. Screening You may have the following tests or measurements:  Height, weight, and BMI.  Diabetes screening. This is done by checking your blood sugar (glucose) after you have not eaten for a while (fasting).  Blood pressure.  Lipid and cholesterol levels. These may be checked every 5 years starting at age 17.  Skin check.  Hepatitis C blood test.  Hepatitis B blood test.  Sexually transmitted disease (STD) testing.  BRCA-related cancer screening. This may  be done if you have a family history of breast, ovarian, tubal, or peritoneal cancers.  Pelvic exam and  Pap test. This may be done every 3 years starting at age 26. Starting at age 54, this may be done every 5 years if you have a Pap test in combination with an HPV test. Discuss your test results, treatment options, and if necessary, the need for more tests with your health care provider. Vaccines Your health care provider may recommend certain vaccines, such as:  Influenza vaccine. This is recommended every year.  Tetanus, diphtheria, and acellular pertussis (Tdap, Td) vaccine. You may need a Td booster every 10 years.  Varicella vaccine. You may need this if you have not been vaccinated.  HPV vaccine. If you are 65 or younger, you may need three doses over 6 months.  Measles, mumps, and rubella (MMR) vaccine. You may need at least one dose of MMR. You may also need a second dose.  Pneumococcal 13-valent conjugate (PCV13) vaccine. You may need this if you have certain conditions and were not previously vaccinated.  Pneumococcal polysaccharide (PPSV23) vaccine. You may need one or two doses if you smoke cigarettes or if you have certain conditions.  Meningococcal vaccine. One dose is recommended if you are age 43-21 years and a first-year college student living in a residence hall, or if you have one of several medical conditions. You may also need additional booster doses.  Hepatitis A vaccine. You may need this if you have certain conditions or if you travel or work in places where you may be exposed to hepatitis A.  Hepatitis B vaccine. You may need this if you have certain conditions or if you travel or work in places where you may be exposed to hepatitis B.  Haemophilus influenzae type b (Hib) vaccine. You may need this if you have certain risk factors. Talk to your health care provider about which screenings and vaccines you need and how often you need them. This information is not intended to replace advice given to you by your health care provider. Make sure you discuss any questions  you have with your health care provider. Document Released: 09/13/2001 Document Revised: 02/28/2017 Document Reviewed: 05/19/2015 Elsevier Interactive Patient Education  2019 [place] and Breastfeeding Thrush, also called candidiasis, is a fungal infection that can be passed between a mother and her baby during breastfeeding. It can cause nipple pain and sensitivity, and can cause symptoms in a baby, such as a rash or white patches in the mouth. If you are breastfeeding, you and your baby may need treatment at the same time in order to clear up the infection, even if one does not have symptoms. Occasionally, other family members, especially your sexual partner, may need to be treated at the same time. What are the causes? This condition is caused by a sudden increase (overgrowth) of the Candida fungus. This fungus is normally present in small amounts in warm, dark, and moist places of the body, such as skin folds under the breast and wet nipples covered by bras or nursing bra pads. Normally, the fungus is kept at healthy levels by the natural bacteria in our bodies. When the body's natural balance of bacteria is altered, the fungus can grow and multiply quickly. What increases the risk? You are more likely to develop this condition if:  You or your baby has been taking antibiotic medicines.  Your nipples are cracked.  You are taking birth control pills (oral  contraceptives).  You are taking medicines to reduce inflammation (steroids), such as asthma medicines.  You have had a previous yeast infection. What are the signs or symptoms? Symptoms of this condition include:  Breast pain during, between, or right after feedings.  Nipples that are: ? Sore. Soreness may start suddenly two weeks after giving birth. ? Sensitive. They may be painful even with a light touch. ? A deep pink or red color. They may have small blisters on them. ? Puffy and shiny. ? Leaky. ? Itchy. ?  Cracked, scaly, or flaky. Your baby may have the following symptoms:  Bright red rash on the buttocks.  Sore-looking blisters or pimples (pustules) on the buttocks.  White patches on the tongue. The patches cannot be wiped off with a clean paper towel.  Fussiness.  Refusal to breastfeed. How is this diagnosed? This condition is diagnosed based on:  Your symptoms.  Culture tests. This is when samples of discharge from your breasts are grown and then checked under a microscope. How is this treated? This condition may be treated by:  Applying antifungal cream to your nipples after each feeding.  Medicine for you or your baby. Symptoms usually improve within 24-48 hours after starting treatment. In some cases, symptoms may get worse before they get better. Make sure that you, your baby, and your sexual partner get checked for thrush and treated at the same time. Follow these instructions at home: Medicines  Take or use over-the-counter and prescription medicines, creams, and ointments only as told by your health care provider.  Give your child over-the-counter and prescription medicines only as told by his or her health care provider.  If you or your child were prescribed an antifungal medicine, apply it or give it as told by your health care provider. Do not stop using the medicine even if you or your child starts to feel better. Stopping the medicine early can cause symptoms to return.  If directed, take a probiotic supplement. Probiotics are the good bacteria and yeasts that live in your body and keep you and your digestive system healthy. General hygiene   Wash your hands often with hot, soapy water, and pat them dry. Wash them before and after nursing, after changing diapers, and after using the bathroom.  Wash your baby's hands often, especially if he or she sucks on his or her fingers.  Before breastfeeding, wash your nipples with warm water. Let nipples air dry after  washing and feeding.  If your baby uses a pacifier, rubber nipples, teethers, or mouth toys, boil them for 20 minutes a day and replace them every week.  Wash your breast pump and all its parts thoroughly in a solution of water and bleach. Boil all parts that touch milk (except the rubber gaskets).  Wear 100% cotton bras and wash them every day in hot water. Consider using bleach to kill fungus. Change bra pads after each feeding.  Use very hot water to wash any towels or clothing that has contact with infected areas. General instructions  Make sure that your baby is seen by a health care provider, and that you and your baby get treated at the same time.  Try nursing more often but for shorter periods of time. Start nursing on the least sore side.  If nursing becomes too painful, try temporarily pumping your milk instead. Do not save or freeze this milk, because giving it to your baby after treatment is done could cause the infection to return.  Eat yogurt that has active, live cultures. Contact a health care provider if:  You or your baby get worse or do not get better after 24-48 hours of treatment.  You take antibiotics and then your breasts develop shooting pains, discomfort, itching, or burning. Get help right away if:  You have a fever or other symptoms that do not improve or get worse.  You develop swelling and severe pain in your breast.  You develop blisters on your breast.  You feel a lump in your breast, with or without pain.  Your nipple starts bleeding. Summary  Tamara Bishop is a fungal infection that can be passed between a mother and her baby during breastfeeding.  This condition may be treated with topical antifungal creams applied to the nipple after each feeding.  The spread of the infection can be controlled by washing hands, keeping your nipples clean and dry, and washing and sterilizing breast pumps, pacifiers, and other items that touch infected areas. This  information is not intended to replace advice given to you by your health care provider. Make sure you discuss any questions you have with your health care provider. Document Released: 11/12/2004 Document Revised: 10/25/2016 Document Reviewed: 10/25/2016 Elsevier Interactive Patient Education  2019 [employer].

## 2018-11-23 NOTE — Progress Notes (Signed)
Subjective:    CHASITITY SAX is a 23 y.o. G72P1001 Caucasian female who presents for a postpartum visit. She is 6 weeks postpartum following a induced vaginal birth at 37+1 gestational weeks. Anesthesia: epidural. I have fully reviewed the prenatal and intrapartum course.   Postpartum course has been complicated by hypertension requiring medication.   Baby's course has been uncomplicated. Baby is feeding by breast. Patient notes cyst/lump to outer 9 o'clock are of right breast and intermittent nipple pain.    Bleeding no bleeding. Bowel function is normal. Bladder function is normal.   Patient is not sexually active.  Contraception method is abstinence. Postpartum depression screening: positive. Score 14.  Last pap 04/2018 and was LSIL.  Denies difficulty breathing or respiratory distress, chest pain, abdominal pain, excessive vaginal bleeding, dysuria, and leg pain or swelling.   The following portions of the patient's history were reviewed and updated as appropriate: allergies, current medications, past medical history, past surgical history and problem list.  Review of Systems  Pertinent items are noted in HPI.   Objective:   BP 120/76   Pulse 70   Ht 5\' 2"  (1.575 m)   Wt 150 lb 2 oz (68.1 kg)   Breastfeeding Yes   BMI 27.46 kg/m   General:  Alert, cooperative and no distress   Breasts:  Round, mobile grape sized furuncle to 9 o'clock area on right breast; bilateral nipple redness with "shiny" appearance  Lungs: Clear to auscultation bilaterally  Heart:  Regular rate and rhythm  Abdomen: Soft, nontender   Vulva: Normal  Vagina: Normal vagina  Cervix:  Closed  Corpus: Well-involuted  Adnexa:  Non-palpable    Depression screen [facility] 2/9 11/23/2018 08/17/2018 06/02/2015  Decreased Interest 1 0 0  Down, Depressed, Hopeless 1 0 0  PHQ - 2 Score 2 0 0  Altered sleeping 3 1 -  Tired, decreased energy 3 0 -  Change in appetite 3 0 -  Feeling bad or failure about yourself  1  0 -  Trouble concentrating 0 1 -  Moving slowly or fidgety/restless 2 0 -  Suicidal thoughts 0 0 -  PHQ-9 Score 14 2 -   GAD 7 : Generalized Anxiety Score 11/23/2018 10/22/2018 08/17/2018  Nervous, Anxious, on Edge 1 1 1   Control/stop worrying 1 1 3   Worry too much - different things 1 1 3   Trouble relaxing 1 1 1   Restless 1 1 1   Easily annoyed or irritable 3 0 0  Afraid - awful might happen 1 1 1   Total GAD 7 Score 9 6 10   Anxiety Difficulty Somewhat difficult Not difficult at all Not difficult at all        Assessment:   Postpartum exam Six (6) wks s/p induced vaginal birth due to pre-eclampsia Breastfeeding Depression screening Contraception counseling   Plan:   Declines counseling or restarting Lexapro at this time. Working on home treatment measures. Agrees to start medication if symptoms are daily.   Stop Nifedipine.   Rx: Clindamycin, Slynd, and Diflucan, see orders. Sample of Slynd given.   Encouraged routine health maintenance techniques.   Reviewed red flag symptoms and when to call.   Follow up in: 5 months for ANNUAL EXAM with Pap or earlier if needed   Gunnar Bulla, CNM [facility], [facility] 11/23/18 1:38 PM

## 2018-11-26 ENCOUNTER — Encounter: Payer: Self-pay | Admitting: Certified Nurse Midwife

## 2018-12-03 ENCOUNTER — Other Ambulatory Visit: Payer: Self-pay | Admitting: Certified Nurse Midwife

## 2018-12-06 ENCOUNTER — Encounter: Payer: Self-pay | Admitting: Certified Nurse Midwife

## 2018-12-06 ENCOUNTER — Telehealth: Payer: Self-pay | Admitting: Certified Nurse Midwife

## 2018-12-06 NOTE — Telephone Encounter (Signed)
The patient called and stated that she needs a call back from a nurse today if possible. Please advise.

## 2018-12-06 NOTE — Telephone Encounter (Signed)
Spoke with patient, patient wondering if letter was faxed to her employer.  Patient aware letter was faxed.

## 2018-12-20 ENCOUNTER — Encounter: Payer: Self-pay | Admitting: Certified Nurse Midwife

## 2019-04-12 ENCOUNTER — Encounter: Payer: Self-pay | Admitting: Certified Nurse Midwife

## 2019-04-16 ENCOUNTER — Other Ambulatory Visit: Payer: Self-pay

## 2019-04-16 ENCOUNTER — Encounter: Payer: Self-pay | Admitting: Certified Nurse Midwife

## 2019-04-16 ENCOUNTER — Other Ambulatory Visit (HOSPITAL_COMMUNITY)
Admission: RE | Admit: 2019-04-16 | Discharge: 2019-04-16 | Disposition: A | Discharge status: Home / Self Care | Payer: Medicaid Other | Source: Ambulatory Visit | Attending: Certified Nurse Midwife | Admitting: Certified Nurse Midwife

## 2019-04-16 ENCOUNTER — Ambulatory Visit (INDEPENDENT_AMBULATORY_CARE_PROVIDER_SITE_OTHER): Payer: Medicaid Other | Admitting: Certified Nurse Midwife

## 2019-04-16 VITALS — BP 120/73 | HR 70 | Ht 62.0 in | Wt 136.9 lb

## 2019-04-16 DIAGNOSIS — N920 Excessive and frequent menstruation with regular cycle: Secondary | ICD-10-CM | POA: Diagnosis not present

## 2019-04-16 DIAGNOSIS — Z124 Encounter for screening for malignant neoplasm of cervix: Secondary | ICD-10-CM | POA: Insufficient documentation

## 2019-04-16 DIAGNOSIS — Z01419 Encounter for gynecological examination (general) (routine) without abnormal findings: Secondary | ICD-10-CM

## 2019-04-16 DIAGNOSIS — Z23 Encounter for immunization: Secondary | ICD-10-CM | POA: Diagnosis not present

## 2019-04-16 DIAGNOSIS — E559 Vitamin D deficiency, unspecified: Secondary | ICD-10-CM | POA: Diagnosis not present

## 2019-04-16 DIAGNOSIS — E538 Deficiency of other specified B group vitamins: Secondary | ICD-10-CM

## 2019-04-16 DIAGNOSIS — R87612 Low grade squamous intraepithelial lesion on cytologic smear of cervix (LGSIL): Secondary | ICD-10-CM

## 2019-04-16 DIAGNOSIS — Z0000 Encounter for general adult medical examination without abnormal findings: Secondary | ICD-10-CM

## 2019-04-16 DIAGNOSIS — M79674 Pain in right toe(s): Secondary | ICD-10-CM

## 2019-04-16 NOTE — Progress Notes (Signed)
Patient here for annual exam, c/o heavier menstrual periods with clots x2 months.

## 2019-04-16 NOTE — Addendum Note (Signed)
Addended by: Cherre Huger on: 04/16/2019 09:46 AM   Modules accepted: Orders

## 2019-04-16 NOTE — Progress Notes (Addendum)
ANNUAL PREVENTATIVE CARE GYN  ENCOUNTER NOTE  Subjective:       Tamara Bishop is a 23 y.o. G58P1001 female here for a routine annual gynecologic exam.  Current complaints: 1. Heavier cycle with small clots for the last two (2) months 2. Needs Pap smear-history abnormal pap 3. Desires referral to Podiatrist due to right big toe pain  Denies difficulty breathing or respiratory distress, chest pain, abdominal pain, excessive vaginal bleeding, dysuria, and leg pain or swelling.    Gynecologic History  Patient's last menstrual period was 04/13/2019 (approximate). Period Cycle (Days): 30 Period Duration (Days): 7 Period Pattern: Regular Menstrual Flow: Heavy, Light Menstrual Control: Tampon Dysmenorrhea: (!) Moderate Dysmenorrhea Symptoms: Cramping  Contraception: coitus interruptus  Last Pap: 04/2018. Results were: abnormal, LSIL/HPV+  Obstetric History  OB History  Gravida Para Term Preterm AB Living  1 1 1  0 0 1  SAB TAB Ectopic Multiple Live Births  0 0 0 0 1    # Outcome Date GA Lbr Len/2nd Weight Sex Delivery Anes PTL Lv  1 Term 10/17/18 [redacted]w[redacted]d / 00:46 6 lb 8.8 oz (2.97 kg) F Vag-Spont EPI  LIV     Complications: Preeclampsia    Past Medical History:  Diagnosis Date  . Adjustment disorder with anxiety   . Allergic rhinitis   . Anxiety   . Cigarette smoker   . Depression   . Insomnia   . Tobacco abuse     Past Surgical History:  Procedure Laterality Date  . NO PAST SURGERIES    . WISDOM TOOTH EXTRACTION      No current outpatient medications on file prior to visit.   No current facility-administered medications on file prior to visit.     Allergies  Allergen Reactions  . Azithromycin Other (See Comments)    Felt like her stomach was going to come out of her body    Social History   Socioeconomic History  . Marital status: Significant Other    Spouse name: Ernestina Patches  . Number of children: Not on file  . Years of education: Not on file  .  Highest education level: Not on file  Occupational History  . Not on file  Social Needs  . Financial resource strain: Not hard at all  . Food insecurity    Worry: Never true    Inability: Never true  . Transportation needs    Medical: No    Non-medical: No  Tobacco Use  . Smoking status: Former Smoker    Packs/day: 0.50    Years: 1.00    Pack years: 0.50    Types: Cigarettes  . Smokeless tobacco: Never Used  Substance and Sexual Activity  . Alcohol use: No  . Drug use: No  . Sexual activity: Yes    Birth control/protection: Coitus interruptus  Lifestyle  . Physical activity    Days per week: 5 days    Minutes per session: Not on file  . Stress: Not at all  Relationships  . Social connections    Talks on phone: More than three times a week    Gets together: More than three times a week    Attends religious service: More than 4 times per year    Active member of club or organization: Yes    Attends meetings of clubs or organizations: Never    Relationship status: Living with partner  . Intimate partner violence    Fear of current or ex partner: No    Emotionally  abused: No    Physically abused: No    Forced sexual activity: No  Other Topics Concern  . Not on file  Social History Narrative  . Not on file    Family History  Problem Relation Age of Onset  . Lupus Mother   . Diabetes Father   . Breast cancer Maternal Grandmother   . Cancer Maternal Grandmother         breast  . Diabetes Paternal Grandmother   . Diabetes Paternal Grandfather   . Lupus Sister   . Heart disease Maternal Grandfather   . Ovarian cancer Neg Hx   . Colon cancer Neg Hx     The following portions of the patient's history were reviewed and updated as appropriate: allergies, current medications, past family history, past medical history, past social history, past surgical history and problem list.  Review of Systems  ROS negative except as noted above. Information obtained from  patient.    Objective:   BP 120/73   Pulse 70   Ht 5\' 2"  (1.575 m)   Wt 136 lb 14.4 oz (62.1 kg)   LMP 04/13/2019 (Approximate)   Breastfeeding Yes   BMI 25.04 kg/m    CONSTITUTIONAL: Well-developed, well-nourished female in no acute distress.   PSYCHIATRIC: Normal mood and affect. Normal behavior. Normal judgment and thought content.  [place]: Alert and oriented to person, place, and time. Normal muscle tone coordination. No cranial nerve deficit noted.  HENT:  Normocephalic, atraumatic, External right and left ear normal.   EYES: Conjunctivae and EOM are normal. Pupils are equal and round.   NECK: Normal range of motion, supple, no masses.  Normal thyroid.   SKIN: Skin is warm and dry. No rash noted. Not diaphoretic. No erythema. No pallor. Professional tattoos present.   CARDIOVASCULAR: Normal heart rate noted, regular rhythm, no murmur.  RESPIRATORY: Clear to auscultation bilaterally. Effort and breath sounds normal, no problems with respiration noted.  BREASTS: Symmetric in size. No masses, skin changes, nipple drainage, or lymphadenopathy. Lactating.   ABDOMEN: Soft, normal bowel sounds, no distention noted.  No tenderness, rebound or guarding.   PELVIC:  External Genitalia: Normal  Vagina: Normal  Cervix: Normal, Pap collected  Uterus: Normal  Adnexa: Normal   MUSCULOSKELETAL: Normal range of motion. No tenderness.  No cyanosis, clubbing, or edema.  2+ distal pulses.  LYMPHATIC: No Axillary, Supraclavicular, or Inguinal Adenopathy.  Assessment:   Annual gynecologic examination 23 y.o.   Contraception: coitus interruptus   Overweight   Problem List Items Addressed This Visit      Other   Low grade 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)   Relevant Orders   Cytology - PAP   Vitamin D deficiency   B12 deficiency    Other Visit Diagnoses    Well woman exam    -  Primary   Relevant Orders   Cytology - PAP   Screening for  cervical cancer       Relevant Orders   Cytology - PAP   Menorrhagia with regular cycle       Lactating mother          Plan:   Pap: Pap, Reflex if ASCUS  Labs: See orders   Flu vaccine given, see chart  Routine preventative health maintenance measures emphasized: Exercise/Diet/Weight control, Tobacco Warnings, Alcohol/Substance use risks, Stress Management and Peer Pressure Issues; see AVS  Reviewed red flag symptoms and when to call  RTC x 1 year for [employer]  or sooner if needed   Gunnar BullaJenkins Michelle Zita Ozimek, CNM [facility], [facility] 04/16/19 9:26 AM

## 2019-04-16 NOTE — Patient Instructions (Signed)
Preventive Care 21-23 Years Old, Female Preventive care refers to visits with your health care provider and lifestyle choices that can promote health and wellness. This includes:  A yearly physical exam. This may also be called an annual well check.  Regular dental visits and eye exams.  Immunizations.  Screening for certain conditions.  Healthy lifestyle choices, such as eating a healthy diet, getting regular exercise, not using drugs or products that contain nicotine and tobacco, and limiting alcohol use. What can I expect for my preventive care visit? Physical exam Your health care provider will check your:  Height and weight. This may be used to calculate body mass index (BMI), which tells if you are at a healthy weight.  Heart rate and blood pressure.  Skin for abnormal spots. Counseling Your health care provider may ask you questions about your:  Alcohol, tobacco, and drug use.  Emotional well-being.  Home and relationship well-being.  Sexual activity.  Eating habits.  Work and work environment.  Method of birth control.  Menstrual cycle.  Pregnancy history. What immunizations do I need?  Influenza (flu) vaccine  This is recommended every year. Tetanus, diphtheria, and pertussis (Tdap) vaccine  You may need a Td booster every 10 years. Varicella (chickenpox) vaccine  You may need this if you have not been vaccinated. Human papillomavirus (HPV) vaccine  If recommended by your health care provider, you may need three doses over 6 months. Measles, mumps, and rubella (MMR) vaccine  You may need at least one dose of MMR. You may also need a second dose. Meningococcal conjugate (MenACWY) vaccine  One dose is recommended if you are age 19-21 years and a first-year college student living in a residence hall, or if you have one of several medical conditions. You may also need additional booster doses. Pneumococcal conjugate (PCV13) vaccine  You may need  this if you have certain conditions and were not previously vaccinated. Pneumococcal polysaccharide (PPSV23) vaccine  You may need one or two doses if you smoke cigarettes or if you have certain conditions. Hepatitis A vaccine  You may need this if you have certain conditions or if you travel or work in places where you may be exposed to hepatitis A. Hepatitis B vaccine  You may need this if you have certain conditions or if you travel or work in places where you may be exposed to hepatitis B. Haemophilus influenzae type b (Hib) vaccine  You may need this if you have certain conditions. You may receive vaccines as individual doses or as more than one vaccine together in one shot (combination vaccines). Talk with your health care provider about the risks and benefits of combination vaccines. What tests do I need?  Blood tests  Lipid and cholesterol levels. These may be checked every 5 years starting at age 20.  Hepatitis C test.  Hepatitis B test. Screening  Diabetes screening. This is done by checking your blood sugar (glucose) after you have not eaten for a while (fasting).  Sexually transmitted disease (STD) testing.  BRCA-related cancer screening. This may be done if you have a family history of breast, ovarian, tubal, or peritoneal cancers.  Pelvic exam and Pap test. This may be done every 3 years starting at age 21. Starting at age 30, this may be done every 5 years if you have a Pap test in combination with an HPV test. Talk with your health care provider about your test results, treatment options, and if necessary, the need for more tests.   Follow these instructions at home: Eating and drinking   Eat a diet that includes fresh fruits and vegetables, whole grains, lean protein, and low-fat dairy.  Take vitamin and mineral supplements as recommended by your health care provider.  Do not drink alcohol if: ? Your health care provider tells you not to drink. ? You are  pregnant, may be pregnant, or are planning to become pregnant.  If you drink alcohol: ? Limit how much you have to 0-1 drink a day. ? Be aware of how much alcohol is in your drink. In the [place] one drink equals one 12 oz bottle of beer (355 mL), one 5 oz glass of wine (148 mL), or one 1 oz glass of hard liquor (44 mL). Lifestyle  Take daily care of your teeth and gums.  Stay active. Exercise for at least 30 minutes on 5 or more days each week.  Do not use any products that contain nicotine or tobacco, such as cigarettes, e-cigarettes, and chewing tobacco. If you need help quitting, ask your health care provider.  If you are sexually active, practice safe sex. Use a condom or other form of birth control (contraception) in order to prevent pregnancy and STIs (sexually transmitted infections). If you plan to become pregnant, see your health care provider for a preconception visit. What's next?  Visit your health care provider once a year for a well check visit.  Ask your health care provider how often you should have your eyes and teeth checked.  Stay up to date on all vaccines. This information is not intended to replace advice given to you by your health care provider. Make sure you discuss any questions you have with your health care provider. Document Released: 09/13/2001 Document Revised: 03/29/2018 Document Reviewed: 03/29/2018 Elsevier Patient Education  2020 [employer]vier [employer]  

## 2019-04-17 LAB — B12 AND FOLATE PANEL
Folate: 6.3 ng/mL (ref >3.0)
Vitamin B-12: 376 pg/mL (ref 232–1245)

## 2019-04-17 LAB — THYROID PANEL WITH TSH
Free Thyroxine Index: 1.9 (ref 1.2–4.9)
T3 Uptake Ratio: 27 % (ref 24–39)
T4, Total: 6.9 ug/dL (ref 4.5–12.0)
TSH: 1.25 u[IU]/mL (ref 0.450–4.500)

## 2019-04-17 LAB — CBC
Hematocrit: 40.2 % (ref 34.0–46.6)
Hemoglobin: 13.2 g/dL (ref 11.1–15.9)
MCH: 28.3 pg (ref 26.6–33.0)
MCHC: 32.8 g/dL (ref 31.5–35.7)
MCV: 86 fL (ref 79–97)
Platelets: 271 10*3/uL (ref 150–450)
RBC: 4.67 x10E6/uL (ref 3.77–5.28)
RDW: 12.8 % (ref 11.7–15.4)
WBC: 8.1 10*3/uL (ref 3.4–10.8)

## 2019-04-17 LAB — VITAMIN D 25 HYDROXY (VIT D DEFICIENCY, FRACTURES): Vit D, 25-Hydroxy: 43.8 ng/mL (ref 30.0–100.0)

## 2019-05-09 ENCOUNTER — Encounter: Payer: Self-pay | Admitting: Certified Nurse Midwife

## 2019-05-16 ENCOUNTER — Encounter: Payer: Self-pay | Admitting: Certified Nurse Midwife

## 2019-05-21 ENCOUNTER — Encounter: Payer: Self-pay | Admitting: Certified Nurse Midwife

## 2019-05-31 ENCOUNTER — Encounter: Payer: Self-pay | Admitting: Certified Nurse Midwife

## 2019-05-31 ENCOUNTER — Other Ambulatory Visit: Payer: Self-pay

## 2019-05-31 ENCOUNTER — Ambulatory Visit (INDEPENDENT_AMBULATORY_CARE_PROVIDER_SITE_OTHER): Payer: Medicaid Other | Admitting: Certified Nurse Midwife

## 2019-05-31 VITALS — BP 119/86 | HR 96 | Ht 61.5 in | Wt 137.1 lb

## 2019-05-31 DIAGNOSIS — R102 Pelvic and perineal pain: Secondary | ICD-10-CM | POA: Diagnosis not present

## 2019-05-31 DIAGNOSIS — O26891 Other specified pregnancy related conditions, first trimester: Secondary | ICD-10-CM

## 2019-05-31 DIAGNOSIS — N912 Amenorrhea, unspecified: Secondary | ICD-10-CM | POA: Diagnosis not present

## 2019-05-31 DIAGNOSIS — R109 Unspecified abdominal pain: Secondary | ICD-10-CM

## 2019-05-31 DIAGNOSIS — O26899 Other specified pregnancy related conditions, unspecified trimester: Secondary | ICD-10-CM

## 2019-05-31 DIAGNOSIS — O26892 Other specified pregnancy related conditions, second trimester: Secondary | ICD-10-CM | POA: Insufficient documentation

## 2019-05-31 DIAGNOSIS — O219 Vomiting of pregnancy, unspecified: Secondary | ICD-10-CM

## 2019-05-31 LAB — POCT URINE PREGNANCY: Preg Test, Ur: POSITIVE — AB

## 2019-05-31 MED ORDER — ASPIRIN EC 81 MG PO TBEC: 81.0000 mg | DELAYED_RELEASE_TABLET | Freq: Every day | ORAL | 2 refills | Status: DC

## 2019-05-31 MED ORDER — ONDANSETRON 4 MG PO TBDP: 4.0000 mg | ORAL_TABLET | Freq: Four times a day (QID) | ORAL | 0 refills | Status: DC | PRN

## 2019-05-31 NOTE — Progress Notes (Signed)
Patient here for pregnancy confirmation.  Patient c/o morning sickness, taking B6 and unisom with some relief.  Request Zofran Rx.  Patient c/o intermittent abdominal/pelvic cramping x3 weeks.

## 2019-05-31 NOTE — Progress Notes (Signed)
GYN ENCOUNTER NOTE  Subjective:       Tamara Bishop is a 23 y.o. G60P1001 female here for pregnancy confirmation.   Reports "about 50 positive home pregnancy tests". Endorses nausea with vomiting and intermittent abdominal cramping.   No relief with home treatment measures. Requests Zofran prescription.   Pregnancy is unplanned yet no prevented. History significant for pre-eclampsia in last pregnancy.   Taking prenatal vitamin with folic acid and DHA.   Denies difficulty breathing or respiratory distress, chest pain, dysuria, and leg pain or swelling.    Gynecologic History  Patient's last menstrual period was 04/14/2019 (approximate). Period Cycle (Days): 28 Period Duration (Days): 7 Period Pattern: Regular Menstrual Flow: Moderate Menstrual Control: Tampon Dysmenorrhea: (!) Mild Dysmenorrhea Symptoms: Cramping  Contraception: none   Gestational age: 10 weeks 5 days  Estimated date of birth: 01/19/2020  Last Pap: results pending  Obstetric History  OB History  Gravida Para Term Preterm AB Living  2 1 1  0 0 1  SAB TAB Ectopic Multiple Live Births  0 0 0 0 1    # Outcome Date GA Lbr Len/2nd Weight Sex Delivery Anes PTL Lv  2 Current           1 Term 10/17/18 [redacted]w[redacted]d / 00:46 6 lb 8.8 oz (2.97 kg) F Vag-Spont EPI  LIV     Complications: Preeclampsia    Past Medical History:  Diagnosis Date  . Adjustment disorder with anxiety   . Allergic rhinitis   . Anxiety   . Cigarette smoker   . Depression   . Insomnia   . Tobacco abuse     Past Surgical History:  Procedure Laterality Date  . WISDOM TOOTH EXTRACTION      Current Outpatient Medications on File Prior to Visit  Medication Sig Dispense Refill  . Prenatal Vit-Fe Fumarate-FA (MULTIVITAMIN-PRENATAL) 27-0.8 MG TABS tablet Take 1 tablet by mouth daily at 12 noon.     No current facility-administered medications on file prior to visit.     Allergies  Allergen Reactions  . Azithromycin Other (See  Comments)    Felt like her stomach was going to come out of her body    Social History   Socioeconomic History  . Marital status: Significant Other    Spouse name: [redacted]w[redacted]d  . Number of children: Not on file  . Years of education: Not on file  . Highest education level: Not on file  Occupational History  . Not on file  Social Needs  . Financial resource strain: Not hard at all  . Food insecurity    Worry: Never true    Inability: Never true  . Transportation needs    Medical: No    Non-medical: No  Tobacco Use  . Smoking status: Former Smoker    Packs/day: 0.50    Years: 1.00    Pack years: 0.50    Types: Cigarettes  . Smokeless tobacco: Never Used  Substance and Sexual Activity  . Alcohol use: No  . Drug use: No  . Sexual activity: Yes    Birth control/protection: None  Lifestyle  . Physical activity    Days per week: 5 days    Minutes per session: Not on file  . Stress: Not at all  Relationships  . Social connections    Talks on phone: More than three times a week    Gets together: More than three times a week    Attends religious service: More than 4 times per  year    Active member of club or organization: Yes    Attends meetings of clubs or organizations: Never    Relationship status: Living with partner  . Intimate partner violence    Fear of current or ex partner: No    Emotionally abused: No    Physically abused: No    Forced sexual activity: No  Other Topics Concern  . Not on file  Social History Narrative  . Not on file    Family History  Problem Relation Age of Onset  . Lupus Mother   . Diabetes Father   . Breast cancer Maternal Grandmother   . Cancer Maternal Grandmother         breast  . Diabetes Paternal Grandmother   . Diabetes Paternal Grandfather   . Lupus Sister   . Heart disease Maternal Grandfather   . Ovarian cancer Neg Hx   . Colon cancer Neg Hx     The following portions of the patient's history were reviewed and  updated as appropriate: allergies, current medications, past family history, past medical history, past social history, past surgical history and problem list.  Review of Systems  ROS negative except as noted above. Information obtained from patient.   Objective:   BP 119/86   Pulse 96   Ht 5' 1.5" (1.562 m)   Wt 137 lb 1.6 oz (62.2 kg)   LMP 04/14/2019 (Approximate)   Breastfeeding Yes   BMI 25.49 kg/m    CONSTITUTIONAL: Well-developed, well-nourished female in no acute distress.   PHYSICAL EXAM: Not indicated.   POCT urine pregnancy     Status: Abnormal   Collection Time: 05/31/19 10:04 AM  Result Value Ref Range   Preg Test, Ur Positive (A) Negative    Assessment:   1. Amenorrhea  - POCT urine pregnancy  2. Pelvic pain affecting pregnancy in first trimester, antepartum   3. Cramping affecting pregnancy, antepartum   4. Nausea/vomiting in pregnancy   Plan:   First trimester education, see AVS.   Rx: Zofran, see orders. Patient desires medication after warnings provided.   Rx: Aspirin, see orders. Agrees to start at 12 weeks.   Reviewed red flag symptoms and when to call.   RTC x 1-2 weeks for dating/viability ultrasound and nurse intake or sooner if needed.    Diona Fanti, CNM [facility], [facility] 05/31/19 2:58 PM

## 2019-05-31 NOTE — Patient Instructions (Addendum)
Abdominal Pain During Pregnancy  Belly (abdominal) pain is common during pregnancy. There are many possible causes. Most of the time, it is not a serious problem. Other times, it can be a sign that something is wrong with the pregnancy. Always tell your doctor if you have belly pain. Follow these instructions at home:  Do not have sex or put anything in your vagina until your pain goes away completely.  Get plenty of rest until your pain gets better.  Drink enough fluid to keep your pee (urine) pale yellow.  Take over-the-counter and prescription medicines only as told by your doctor.  Keep all follow-up visits as told by your doctor. This is important. Contact a doctor if:  Your pain continues or gets worse after resting.  You have lower belly pain that: ? Comes and goes at regular times. ? Spreads to your back. ? Feels like menstrual cramps.  You have pain or burning when you pee (urinate). Get help right away if:  You have a fever or chills.  You have vaginal bleeding.  You are leaking fluid from your vagina.  You are passing tissue from your vagina.  You throw up (vomit) for more than 24 hours.  You have watery poop (diarrhea) for more than 24 hours.  Your baby is moving less than usual.  You feel very weak or faint.  You have shortness of breath.  You have very bad pain in your upper belly. Summary  Belly (abdominal) pain is common during pregnancy. There are many possible causes.  If you have belly pain during pregnancy, tell your doctor right away.  Keep all follow-up visits as told by your doctor. This is important. This information is not intended to replace advice given to you by your health care provider. Make sure you discuss any questions you have with your health care provider. Document Released: 07/06/2009 Document Revised: 11/05/2018 Document Reviewed: 10/20/2016 Elsevier Patient Education  2020 [employer]. WHAT OB PATIENTS CAN EXPECT    Confirmation of pregnancy and ultrasound ordered if medically indicated-[redacted] weeks gestation  New OB (NOB) intake with nurse and New OB (NOB) labs- [redacted] weeks gestation  New OB (NOB) physical examination with provider- 11/[redacted] weeks gestation  Flu vaccine-[redacted] weeks gestation  Anatomy scan-[redacted] weeks gestation  Glucose tolerance test, blood work to test for anemia, T-dap vaccine-[redacted] weeks gestation  Vaginal swabs/cultures-STD/Group B strep-[redacted] weeks gestation  Appointments every 4 weeks until 28 weeks  Every 2 weeks from 28 weeks until 36 weeks  Weekly visits from 36 weeks until delivery  Common Medications Safe in Pregnancy  Acne:      Constipation:  Benzoyl Peroxide     Colace  Clindamycin      Dulcolax Suppository  Topica Erythromycin     Fibercon  Salicylic Acid      Metamucil         Miralax AVOID:        Senakot   Accutane    Cough:  Retin-A       Cough Drops  Tetracycline      Phenergan w/ Codeine if Rx  Minocycline      Robitussin (Plain & DM)  Antibiotics:     Crabs/Lice:  Ceclor       RID  Cephalosporins    AVOID:  E-Mycins      Kwell  Keflex  Macrobid/Macrodantin   Diarrhea:  Penicillin      Kao-Pectate  Zithromax      Imodium AD  PUSH FLUIDS AVOID:       Cipro     Fever:  Tetracycline      Tylenol (Regular or Extra  Minocycline       Strength)  Levaquin      Extra Strength-Do not          Exceed 8 tabs/24 hrs Caffeine:        <251m/day (equiv. To 1 cup of coffee or  approx. 3 12 oz sodas)         Gas: Cold/Hayfever:       Gas-X  Benadryl      Mylicon  Claritin       Phazyme  **Claritin-D        Chlor-Trimeton    Headaches:  Dimetapp      ASA-Free Excedrin  Drixoral-Non-Drowsy     Cold Compress  Mucinex (Guaifenasin)     Tylenol (Regular or Extra  Sudafed/Sudafed-12 Hour     Strength)  **Sudafed PE Pseudoephedrine   Tylenol Cold & Sinus     Vicks Vapor Rub  Zyrtec  **AVOID if Problems With Blood Pressure         Heartburn: Avoid lying  down for at least 1 hour after meals  Aciphex      Maalox     Rash:  Milk of Magnesia     Benadryl    Mylanta       1% Hydrocortisone Cream  Pepcid  Pepcid Complete   Sleep Aids:  Prevacid      Ambien   Prilosec       Benadryl  Rolaids       Chamomile Tea  Tums (Limit 4/day)     Unisom  Zantac       Tylenol PM         Warm milk-add vanilla or  Hemorrhoids:       Sugar for taste  Anusol/Anusol H.C.  (RX: Analapram 2.5%)  Sugar Substitutes:  Hydrocortisone OTC     Ok in moderation  Preparation H      Tucks        Vaseline lotion applied to tissue with wiping    Herpes:     Throat:  Acyclovir      Oragel  Famvir  Valtrex     Vaccines:         Flu Shot Leg Cramps:       *Gardasil  Benadryl      Hepatitis A         Hepatitis B Nasal Spray:       Pneumovax  Saline Nasal Spray     Polio Booster         Tetanus Nausea:       Tuberculosis test or PPD  Vitamin B6 25 mg TID   AVOID:    Dramamine      *Gardasil  Emetrol       Live Poliovirus  Ginger Root 250 mg QID    MMR (measles, mumps &  High Complex Carbs @ Bedtime    rebella)  Sea Bands-Accupressure    Varicella (Chickenpox)  Unisom 1/2 tab TID     *No known complications           If received before Pain:         Known pregnancy;   Darvocet       Resume series after  Lortab        Delivery  Percocet    Yeast:   Tramadol  Femstat  Tylenol 3      Gyne-lotrimin  Ultram       Monistat  Vicodin           MISC:         All Sunscreens           Hair Coloring/highlights          Insect Repellant's          (Including DEET)         Mystic Tans Morning Sickness  Morning sickness is when you feel sick to your stomach (nauseous) during pregnancy. You may feel sick to your stomach and throw up (vomit). You may feel sick in the morning, but you can feel this way at any time of day. Some women feel very sick to their stomach and cannot stop throwing up (hyperemesis gravidarum). Follow these instructions at home: Medicines   Take over-the-counter and prescription medicines only as told by your doctor. Do not take any medicines until you talk with your doctor about them first.  Taking multivitamins before getting pregnant can stop or lessen the harshness of morning sickness. Eating and drinking  Eat dry toast or crackers before getting out of bed.  Eat 5 or 6 small meals a day.  Eat dry and bland foods like rice and baked potatoes.  Do not eat greasy, fatty, or spicy foods.  Have someone cook for you if the smell of food causes you to feel sick or throw up.  If you feel sick to your stomach after taking prenatal vitamins, take them at night or with a snack.  Eat protein when you need a snack. Nuts, yogurt, and cheese are good choices.  Drink fluids throughout the day.  Try ginger ale made with real ginger, ginger tea made from fresh grated ginger, or ginger candies. General instructions  Do not use any products that have nicotine or tobacco in them, such as cigarettes and e-cigarettes. If you need help quitting, ask your doctor.  Use an air purifier to keep the air in your house free of smells.  Get lots of fresh air.  Try to avoid smells that make you feel sick.  Try: ? Wearing a bracelet that is used for seasickness (acupressure wristband). ? Going to a doctor who puts thin needles into certain body points (acupuncture) to improve how you feel. Contact a doctor if:  You need medicine to feel better.  You feel dizzy or light-headed.  You are losing weight. Get help right away if:  You feel very sick to your stomach and cannot stop throwing up.  You pass out (faint).  You have very bad pain in your belly. Summary  Morning sickness is when you feel sick to your stomach (nauseous) during pregnancy.  You may feel sick in the morning, but you can feel this way at any time of day.  Making some changes to what you eat may help your symptoms go away. This information is not intended to  replace advice given to you by your health care provider. Make sure you discuss any questions you have with your health care provider. Document Released: 08/25/2004 Document Revised: 06/30/2017 Document Reviewed: 08/18/2016 Elsevier Patient Education  2020 [employer].   Breastfeeding During Pregnancy Deciding whether to continue breastfeeding during a pregnancy is an Designer, television/film set. Breastfeeding during pregnancy is generally not risky. Your nursing child may naturally stop breastfeeding (wean) during your pregnancy. If you have problems during pregnancy, you may be advised to  stop breastfeeding. Work with your health care provider to help decide if breastfeeding during pregnancy is right for you. What should I consider when deciding whether to breastfeed during pregnancy? When deciding whether to continue breastfeeding while you are pregnant, you may want to consider:  The age of your nursing child and his or her physical and emotional needs.  Any health concerns related to your pregnancy. It may not be safe to continue breastfeeding if you have certain problems, such as: ? Uterine pain or bleeding. ? A history of preterm labor and delivery. ? Problems gaining weight or losing weight during pregnancy. ? A history of cervical insufficiency. This is a condition in which the cervix begins to thin and soften before your due date.  Whether you have any problems associated with breastfeeding. Some common problems experienced when breastfeeding during pregnancy include: ? Nipple tenderness and breast soreness. ? Nausea. ? Discomfort while breastfeeding due to the growing belly. ? Fatigue. ? Reduced milk supply. This may mean having fewer feedings a day. ? Changes in how your milk tastes. ? Uterine contractions. Follow these instructions at home:   Keep an open mind about how your breastfeeding experience will be. Avoid setting rigid expectations for yourself. Your needs and your nursing  child's needs are likely to change as your pregnancy progresses.  Make sure that you are gaining a healthy amount of weight and eating enough calories.  Eat a healthy diet that includes fresh fruits and vegetables, whole grains, lean meat, fish, eggs, beans, nuts, and seeds, and low-fat dairy products.  Drink plenty of fluids so your urine is clear or pale yellow.  Work with your health care provider and your child's health care provider as needed.  Keep track of your nursing child's weight. If your nursing baby is younger than twelve months, the normal decrease of your milk supply that happens during pregnancy could keep your baby from getting all the milk he or she needs.  Keep track of your nursing child's daily wet diapers and bowel movements to make sure he or she is staying hydrated. Your baby should have 6-8 wet diapers and at least 3 stools each day.  Talk to your health care provider or lactation specialist before starting any new medications or supplements. This is to make sure that they are safe for both pregnancy and breastfeeding. Where to find more information  [employer]: http://www.llli.org Contact a health care provider if:  Your breasts become large and painful (engorged).  Your nursing child is urinating or having bowel movements less often than normal. Summary  Breastfeeding during pregnancy is generally not risky. However, if you have problems during pregnancy, you may be advised to stop breastfeeding.  During pregnancy, your milk supply naturally decreases. Your nursing child may wean himself or herself naturally during your pregnancy.  Keep track of your nursing child's weight, wet diapers, and stools to make sure that he or she is getting enough milk.  Keep an open mind about how your breastfeeding experience will be. Avoid setting rigid expectations for yourself. This information is not intended to replace advice given to you by your health  care provider. Make sure you discuss any questions you have with your health care provider. Document Released: 11/15/2004 Document Revised: 11/22/2016 Document Reviewed: 07/19/2016 Elsevier Patient Education  2020 [employer].

## 2019-06-06 ENCOUNTER — Encounter: Payer: Self-pay | Admitting: Certified Nurse Midwife

## 2019-06-11 ENCOUNTER — Other Ambulatory Visit: Payer: Self-pay | Admitting: Obstetrics and Gynecology

## 2019-06-11 DIAGNOSIS — Z789 Other specified health status: Secondary | ICD-10-CM

## 2019-06-13 ENCOUNTER — Ambulatory Visit (INDEPENDENT_AMBULATORY_CARE_PROVIDER_SITE_OTHER): Payer: Medicaid Other | Admitting: Certified Nurse Midwife

## 2019-06-13 ENCOUNTER — Other Ambulatory Visit: Payer: Self-pay

## 2019-06-13 ENCOUNTER — Ambulatory Visit (INDEPENDENT_AMBULATORY_CARE_PROVIDER_SITE_OTHER): Payer: Medicaid Other

## 2019-06-13 VITALS — BP 112/79 | HR 71 | Ht 61.5 in | Wt 137.4 lb

## 2019-06-13 DIAGNOSIS — Z113 Encounter for screening for infections with a predominantly sexual mode of transmission: Secondary | ICD-10-CM

## 2019-06-13 DIAGNOSIS — Z3481 Encounter for supervision of other normal pregnancy, first trimester: Secondary | ICD-10-CM

## 2019-06-13 DIAGNOSIS — Z3687 Encounter for antenatal screening for uncertain dates: Secondary | ICD-10-CM

## 2019-06-13 DIAGNOSIS — Z0283 Encounter for blood-alcohol and blood-drug test: Secondary | ICD-10-CM

## 2019-06-13 DIAGNOSIS — Z789 Other specified health status: Secondary | ICD-10-CM

## 2019-06-13 MED ORDER — ONDANSETRON 4 MG PO TBDP: 4.0000 mg | ORAL_TABLET | Freq: Four times a day (QID) | ORAL | 0 refills | Status: DC | PRN

## 2019-06-13 NOTE — Patient Instructions (Signed)
WHAT OB PATIENTS CAN EXPECT   Confirmation of pregnancy and ultrasound ordered if medically indicated-[redacted] weeks gestation  New OB (NOB) intake with nurse and New OB (NOB) labs- [redacted] weeks gestation  New OB (NOB) physical examination with provider- 11/[redacted] weeks gestation  Flu vaccine-[redacted] weeks gestation  Anatomy scan-[redacted] weeks gestation  Glucose tolerance test, blood work to test for anemia, T-dap vaccine-[redacted] weeks gestation  Vaginal swabs/cultures-STD/Group B strep-[redacted] weeks gestation  Appointments every 4 weeks until 28 weeks  Every 2 weeks from 28 weeks until 36 weeks  Weekly visits from 36 weeks until delivery  Morning Sickness  Morning sickness is when you feel sick to your stomach (nauseous) during pregnancy. You may feel sick to your stomach and throw up (vomit). You may feel sick in the morning, but you can feel this way at any time of day. Some women feel very sick to their stomach and cannot stop throwing up (hyperemesis gravidarum). Follow these instructions at home: Medicines  Take over-the-counter and prescription medicines only as told by your doctor. Do not take any medicines until you talk with your doctor about them first.  Taking multivitamins before getting pregnant can stop or lessen the harshness of morning sickness. Eating and drinking  Eat dry toast or crackers before getting out of bed.  Eat 5 or 6 small meals a day.  Eat dry and bland foods like rice and baked potatoes.  Do not eat greasy, fatty, or spicy foods.  Have someone cook for you if the smell of food causes you to feel sick or throw up.  If you feel sick to your stomach after taking prenatal vitamins, take them at night or with a snack.  Eat protein when you need a snack. Nuts, yogurt, and cheese are good choices.  Drink fluids throughout the day.  Try ginger ale made with real ginger, ginger tea made from fresh grated ginger, or ginger candies. General instructions  Do not use any products  that have nicotine or tobacco in them, such as cigarettes and e-cigarettes. If you need help quitting, ask your doctor.  Use an air purifier to keep the air in your house free of smells.  Get lots of fresh air.  Try to avoid smells that make you feel sick.  Try: ? Wearing a bracelet that is used for seasickness (acupressure wristband). ? Going to a doctor who puts thin needles into certain body points (acupuncture) to improve how you feel. Contact a doctor if:  You need medicine to feel better.  You feel dizzy or light-headed.  You are losing weight. Get help right away if:  You feel very sick to your stomach and cannot stop throwing up.  You pass out (faint).  You have very bad pain in your belly. Summary  Morning sickness is when you feel sick to your stomach (nauseous) during pregnancy.  You may feel sick in the morning, but you can feel this way at any time of day.  Making some changes to what you eat may help your symptoms go away. This information is not intended to replace advice given to you by your health care provider. Make sure you discuss any questions you have with your health care provider. Document Released: 08/25/2004 Document Revised: 06/30/2017 Document Reviewed: 08/18/2016 Elsevier Patient Education  2020 [employer]. How a Baby Grows During Pregnancy  Pregnancy begins when a female's sperm enters a female's egg (fertilization). Fertilization usually happens in one of the tubes (fallopian tubes) that connect the  ovaries to the womb (uterus). The fertilized egg moves down the fallopian tube to the uterus. Once it reaches the uterus, it implants into the lining of the uterus and begins to grow. For the first 10 weeks, the fertilized egg is called an embryo. After 10 weeks, it is called a fetus. As the fetus continues to grow, it receives oxygen and nutrients through tissue (placenta) that grows to support the developing baby. The placenta is the life support  system for the baby. It provides oxygen and nutrition and removes waste. Learning as much as you can about your pregnancy and how your baby is developing can help you enjoy the experience. It can also make you aware of when there might be a problem and when to ask questions. How long does a typical pregnancy last? A pregnancy usually lasts 280 days, or about 40 weeks. Pregnancy is divided into three periods of growth, also called trimesters:  First trimester: 0-12 weeks.  Second trimester: 13-27 weeks.  Third trimester: 28-40 weeks. The day when your baby is ready to be born (full term) is your estimated date of delivery. How does my baby develop month by month? First month  The fertilized egg attaches to the inside of the uterus.  Some cells will form the placenta. Others will form the fetus.  The arms, legs, brain, spinal cord, lungs, and heart begin to develop.  At the end of the first month, the heart begins to beat. Second month  The bones, inner ear, eyelids, hands, and feet form.  The genitals develop.  By the end of 8 weeks, all major organs are developing. Third month  All of the internal organs are forming.  Teeth develop below the gums.  Bones and muscles begin to grow. The spine can flex.  The skin is transparent.  Fingernails and toenails begin to form.  Arms and legs continue to grow longer, and hands and feet develop.  The fetus is about 3 inches (7.6 cm) long. Fourth month  The placenta is completely formed.  The external sex organs, neck, outer ear, eyebrows, eyelids, and fingernails are formed.  The fetus can hear, swallow, and move its arms and legs.  The kidneys begin to produce urine.  The skin is covered with a white, waxy coating (vernix) and very fine hair (lanugo). Fifth month  The fetus moves around more and can be felt for the first time (quickening).  The fetus starts to sleep and wake up and may begin to suck its finger.  The  nails grow to the end of the fingers.  The organ in the digestive system that makes bile (gallbladder) functions and helps to digest nutrients.  If your baby is a girl, eggs are present in her ovaries. If your baby is a boy, testicles start to move down into his scrotum. Sixth month  The lungs are formed.  The eyes open. The brain continues to develop.  Your baby has fingerprints and toe prints. Your baby's hair grows thicker.  At the end of the second trimester, the fetus is about 9 inches (22.9 cm) long. Seventh month  The fetus kicks and stretches.  The eyes are developed enough to sense changes in light.  The hands can make a grasping motion.  The fetus responds to sound. Eighth month  All organs and body systems are fully developed and functioning.  Bones harden, and taste buds develop. The fetus may hiccup.  Certain areas of the brain are still developing. The  skull remains soft. Ninth month  The fetus gains about  lb (0.23 kg) each week.  The lungs are fully developed.  Patterns of sleep develop.  The fetus's head typically moves into a head-down position (vertex) in the uterus to prepare for birth.  The fetus weighs 6-9 lb (2.72-4.08 kg) and is 19-20 inches (48.26-50.8 cm) long. What can I do to have a healthy pregnancy and help my baby develop? General instructions  Take prenatal vitamins as directed by your health care provider. These include vitamins such as folic acid, iron, calcium, and vitamin D. They are important for healthy development.  Take medicines only as directed by your health care provider. Read labels and ask a pharmacist or your health care provider whether over-the-counter medicines, supplements, and prescription drugs are safe to take during pregnancy.  Keep all follow-up visits as directed by your health care provider. This is important. Follow-up visits include prenatal care and screening tests. How do I know if my baby is developing  well? At each prenatal visit, your health care provider will do several different tests to check on your health and keep track of your baby's development. These include:  Fundal height and position. ? Your health care provider will measure your growing belly from your pubic bone to the top of the uterus using a tape measure. ? Your health care provider will also feel your belly to determine your baby's position.  Heartbeat. ? An ultrasound in the first trimester can confirm pregnancy and show a heartbeat, depending on how far along you are. ? Your health care provider will check your baby's heart rate at every prenatal visit.  Second trimester ultrasound. ? This ultrasound checks your baby's development. It also may show your baby's gender. What should I do if I have concerns about my baby's development? Always talk with your health care provider about any concerns that you may have about your pregnancy and your baby. Summary  A pregnancy usually lasts 280 days, or about 40 weeks. Pregnancy is divided into three periods of growth, also called trimesters.  Your health care provider will monitor your baby's growth and development throughout your pregnancy.  Follow your health care provider's recommendations about taking prenatal vitamins and medicines during your pregnancy.  Talk with your health care provider if you have any concerns about your pregnancy or your developing baby. This information is not intended to replace advice given to you by your health care provider. Make sure you discuss any questions you have with your health care provider. Document Released: 01/04/2008 Document Revised: 11/08/2018 Document Reviewed: 05/31/2017 Elsevier Patient Education  2020 [place] of Pregnancy  The first trimester of pregnancy is from week 1 until the end of week 13 (months 1 through 3). During this time, your baby will begin to develop inside you. At 6-8 weeks, the eyes  and face are formed, and the heartbeat can be seen on ultrasound. At the end of 12 weeks, all the baby's organs are formed. Prenatal care is all the medical care you receive before the birth of your baby. Make sure you get good prenatal care and follow all of your doctor's instructions. Follow these instructions at home: Medicines  Take over-the-counter and prescription medicines only as told by your doctor. Some medicines are safe and some medicines are not safe during pregnancy.  Take a prenatal vitamin that contains at least 600 micrograms (mcg) of folic acid.  If you have trouble pooping (constipation), take medicine that  will make your stool soft (stool softener) if your doctor approves. Eating and drinking   Eat regular, healthy meals.  Your doctor will tell you the amount of weight gain that is right for you.  Avoid raw meat and uncooked cheese.  If you feel sick to your stomach (nauseous) or throw up (vomit): ? Eat 4 or 5 small meals a day instead of 3 large meals. ? Try eating a few soda crackers. ? Drink liquids between meals instead of during meals.  To prevent constipation: ? Eat foods that are high in fiber, like fresh fruits and vegetables, whole grains, and beans. ? Drink enough fluids to keep your pee (urine) clear or pale yellow. Activity  Exercise only as told by your doctor. Stop exercising if you have cramps or pain in your lower belly (abdomen) or low back.  Do not exercise if it is too hot, too humid, or if you are in a place of great height (high altitude).  Try to avoid standing for long periods of time. Move your legs often if you must stand in one place for a long time.  Avoid heavy lifting.  Wear low-heeled shoes. Sit and stand up straight.  You can have sex unless your doctor tells you not to. Relieving pain and discomfort  Wear a good support bra if your breasts are sore.  Take warm water baths (sitz baths) to soothe pain or discomfort caused by  hemorrhoids. Use hemorrhoid cream if your doctor says it is okay.  Rest with your legs raised if you have leg cramps or low back pain.  If you have puffy, bulging veins (varicose veins) in your legs: ? Wear support hose or compression stockings as told by your doctor. ? Raise (elevate) your feet for 15 minutes, 3-4 times a day. ? Limit salt in your food. Prenatal care  Schedule your prenatal visits by the twelfth week of pregnancy.  Write down your questions. Take them to your prenatal visits.  Keep all your prenatal visits as told by your doctor. This is important. Safety  Wear your seat belt at all times when driving.  Make a list of emergency phone numbers. The list should include numbers for family, friends, the hospital, and police and fire departments. General instructions  Ask your doctor for a referral to a local prenatal class. Begin classes no later than at the start of month 6 of your pregnancy.  Ask for help if you need counseling or if you need help with nutrition. Your doctor can give you advice or tell you where to go for help.  Do not use hot tubs, steam rooms, or saunas.  Do not douche or use tampons or scented sanitary pads.  Do not cross your legs for long periods of time.  Avoid all herbs and alcohol. Avoid drugs that are not approved by your doctor.  Do not use any tobacco products, including cigarettes, chewing tobacco, and electronic cigarettes. If you need help quitting, ask your doctor. You may get counseling or other support to help you quit.  Avoid cat litter boxes and soil used by cats. These carry germs that can cause birth defects in the baby and can cause a loss of your baby (miscarriage) or stillbirth.  Visit your dentist. At home, brush your teeth with a soft toothbrush. Be gentle when you floss. Contact a doctor if:  You are dizzy.  You have mild cramps or pressure in your lower belly.  You have a nagging pain  in your belly area.  You  continue to feel sick to your stomach, you throw up, or you have watery poop (diarrhea).  You have a bad smelling fluid coming from your vagina.  You have pain when you pee (urinate).  You have increased puffiness (swelling) in your face, hands, legs, or ankles. Get help right away if:  You have a fever.  You are leaking fluid from your vagina.  You have spotting or bleeding from your vagina.  You have very bad belly cramping or pain.  You gain or lose weight rapidly.  You throw up blood. It may look like coffee grounds.  You are around people who have [place] measles, fifth disease, or chickenpox.  You have a very bad headache.  You have shortness of breath.  You have any kind of trauma, such as from a fall or a car accident. Summary  The first trimester of pregnancy is from week 1 until the end of week 13 (months 1 through 3).  To take care of yourself and your unborn baby, you will need to eat healthy meals, take medicines only if your doctor tells you to do so, and do activities that are safe for you and your baby.  Keep all follow-up visits as told by your doctor. This is important as your doctor will have to ensure that your baby is healthy and growing well. This information is not intended to replace advice given to you by your health care provider. Make sure you discuss any questions you have with your health care provider. Document Released: 01/04/2008 Document Revised: 11/08/2018 Document Reviewed: 07/26/2016 Elsevier Patient Education  2020 [employer]. Commonly Asked Questions During Pregnancy  Cats: A parasite can be excreted in cat feces.  To avoid exposure you need to have another person empty the little box.  If you must empty the litter box you will need to wear gloves.  Wash your hands after handling your cat.  This parasite can also be found in raw or undercooked meat so this should also be avoided.  Colds, Sore Throats, Flu: Please check your medication  sheet to see what you can take for symptoms.  If your symptoms are unrelieved by these medications please call the office.  Dental Work: Most any dental work Investment banker, corporate recommends is permitted.  X-rays should only be taken during the first trimester if absolutely necessary.  Your abdomen should be shielded with a lead apron during all x-rays.  Please notify your provider prior to receiving any x-rays.  Novocaine is fine; gas is not recommended.  If your dentist requires a note from [place] prior to dental work please call the office and we will provide one for you.  Exercise: Exercise is an important part of staying healthy during your pregnancy.  You may continue most exercises you were accustomed to prior to pregnancy.  Later in your pregnancy you will most likely notice you have difficulty with activities requiring balance like riding a bicycle.  It is important that you listen to your body and avoid activities that put you at a higher risk of falling.  Adequate rest and staying well hydrated are a must!  If you have questions about the safety of specific activities ask your provider.    Exposure to Children with illness: Try to avoid obvious exposure; report any symptoms to [place] when noted,  If you have chicken pos, red measles or mumps, you should be immune to these diseases.   Please do not  take any vaccines while pregnant unless you have checked with your OB provider.  Fetal Movement: After 28 weeks we recommend you do "kick counts" twice daily.  Lie or sit down in a calm quiet environment and count your baby movements "kicks".  You should feel your baby at least 10 times per hour.  If you have not felt 10 kicks within the first hour get up, walk around and have something sweet to eat or drink then repeat for an additional hour.  If count remains less than 10 per hour notify your provider.  Fumigating: Follow your pest control agent's advice as to how long to stay out of your home.  Ventilate the area  well before re-entering.  Hemorrhoids:   Most over-the-counter preparations can be used during pregnancy.  Check your medication to see what is safe to use.  It is important to use a stool softener or fiber in your diet and to drink lots of liquids.  If hemorrhoids seem to be getting worse please call the office.   Hot Tubs:  Hot tubs Jacuzzis and saunas are not recommended while pregnant.  These increase your internal body temperature and should be avoided.  Intercourse:  Sexual intercourse is safe during pregnancy as long as you are comfortable, unless otherwise advised by your provider.  Spotting may occur after intercourse; report any bright red bleeding that is heavier than spotting.  Labor:  If you know that you are in labor, please go to the hospital.  If you are unsure, please call the office and let us help you decide what to do.  Lifting, straining, etc:  If your job requires heavy lifting or straining please check with your provider for any limitations.  Generally, you should not lift items heavier than that you can lift simply with your hands and arms (no back muscles)  Painting:  Paint fumes do not harm your pregnancy, but may make you ill and should be avoided if possible.  Latex or water based paints have less odor than oils.  Use adequate ventilation while painting.  Permanents & Hair Color:  Chemicals in hair dyes are not recommended as they cause increase hair dryness which can increase hair loss during pregnancy.  " Highlighting" and permanents are allowed.  Dye may be absorbed differently and permanents may not hold as well during pregnancy.  Sunbathing:  Use a sunscreen, as skin burns easily during pregnancy.  Drink plenty of fluids; avoid over heating.  Tanning Beds:  Because their possible side effects are still unknown, tanning beds are not recommended.  Ultrasound Scans:  Routine ultrasounds are performed at approximately 20 weeks.  You will be able to see your baby's  general anatomy an if you would like to know the gender this can usually be determined as well.  If it is questionable when you conceived you may also receive an ultrasound early in your pregnancy for dating purposes.  Otherwise ultrasound exams are not routinely performed unless there is a medical necessity.  Although you can request a scan we ask that you pay for it when conducted because insurance does not cover " patient request" scans.  Work: If your pregnancy proceeds without complications you may work until your due date, unless your physician or employer advises otherwise.  Round Ligament Pain/Pelvic Discomfort:  Sharp, shooting pains not associated with bleeding are fairly common, usually occurring in the second trimester of pregnancy.  They tend to be worse when standing up or when you remain   standing for long periods of time.  These are the result of pressure of certain pelvic ligaments called "round ligaments".  Rest, Tylenol and heat seem to be the most effective relief.  As the womb and fetus grow, they rise out of the pelvis and the discomfort improves.  Please notify the office if your pain seems different than that described.  It may represent a more serious condition.  Common Medications Safe in Pregnancy  Acne:      Constipation:  Benzoyl Peroxide     Colace  Clindamycin      Dulcolax Suppository  Topica Erythromycin     Fibercon  Salicylic Acid      Metamucil         Miralax AVOID:        Senakot   Accutane    Cough:  Retin-A       Cough Drops  Tetracycline      Phenergan w/ Codeine if Rx  Minocycline      Robitussin (Plain & DM)  Antibiotics:     Crabs/Lice:  Ceclor       RID  Cephalosporins    AVOID:  E-Mycins      Kwell  Keflex  Macrobid/Macrodantin   Diarrhea:  Penicillin      Kao-Pectate  Zithromax      Imodium AD         PUSH FLUIDS AVOID:       Cipro     Fever:  Tetracycline      Tylenol (Regular or Extra  Minocycline       Strength)  Levaquin      Extra  Strength-Do not          Exceed 8 tabs/24 hrs Caffeine:        <267m/day (equiv. To 1 cup of coffee or  approx. 3 12 oz sodas)         Gas: Cold/Hayfever:       Gas-X  Benadryl      Mylicon  Claritin       Phazyme  **Claritin-D        Chlor-Trimeton    Headaches:  Dimetapp      ASA-Free Excedrin  Drixoral-Non-Drowsy     Cold Compress  Mucinex (Guaifenasin)     Tylenol (Regular or Extra  Sudafed/Sudafed-12 Hour     Strength)  **Sudafed PE Pseudoephedrine   Tylenol Cold & Sinus     Vicks Vapor Rub  Zyrtec  **AVOID if Problems With Blood Pressure         Heartburn: Avoid lying down for at least 1 hour after meals  Aciphex      Maalox     Rash:  Milk of Magnesia     Benadryl    Mylanta       1% Hydrocortisone Cream  Pepcid  Pepcid Complete   Sleep Aids:  Prevacid      Ambien   Prilosec       Benadryl  Rolaids       Chamomile Tea  Tums (Limit 4/day)     Unisom  Zantac       Tylenol PM         Warm milk-add vanilla or  Hemorrhoids:       Sugar for taste  Anusol/Anusol H.C.  (RX: Analapram 2.5%)  Sugar Substitutes:  Hydrocortisone OTC     Ok in moderation  Preparation H      Tucks        Vaseline lotion  applied to tissue with wiping    Herpes:     Throat:  Acyclovir      Oragel  Famvir  Valtrex     Vaccines:         Flu Shot Leg Cramps:       *Gardasil  Benadryl      Hepatitis A         Hepatitis B Nasal Spray:       Pneumovax  Saline Nasal Spray     Polio Booster         Tetanus Nausea:       Tuberculosis test or PPD  Vitamin B6 25 mg TID   AVOID:    Dramamine      *Gardasil  Emetrol       Live Poliovirus  Ginger Root 250 mg QID    MMR (measles, mumps &  High Complex Carbs @ Bedtime    rebella)  Sea Bands-Accupressure    Varicella (Chickenpox)  Unisom 1/2 tab TID     *No known complications           If received before Pain:         Known pregnancy;   Darvocet       Resume series after  Lortab        Delivery  Percocet    Yeast:   Tramadol       Femstat  Tylenol 3      Gyne-lotrimin  Ultram       Monistat  Vicodin           MISC:         All Sunscreens           Hair Coloring/highlights          Insect Repellant's          (Including DEET)         Mystic Tans

## 2019-06-13 NOTE — Progress Notes (Signed)
      Tamara Bishop presents for NOB nurse intake visit. Pregnancy confirmation done at [facility], 05/31/2019, with JMLawhorn.  G 2.  P1001.  LMP 04/14/19.  EDD 01/18/2020.  GA [redacted]w[redacted]d Pregnancy education material explained and given.  0 cats in the home.  NOB labs ordered. BMI less than 30. TSH/HbgA1c. Sickle cell not order due to race. HIV and drug screen explained and ordered. Genetic screening discussed. Genetic testing; Unsure. Pt to discuss genetic testing with provider. PNV encouraged. Pt to follow up with provider in 3 weeks for NOB physical. FMLA for reviewed and signed by pt. [facility] financial policy form reviewed with pt.   BP 112/79   Pulse 71   Ht 5' 1.5" (1.562 m)   Wt 137 lb 6.4 oz (62.3 kg)   LMP 04/14/2019 (Approximate)   BMI 25.54 kg/m

## 2019-06-14 LAB — MICROSCOPIC EXAMINATION
Casts: NONE SEEN /lpf
Epithelial Cells (non renal): >10 /hpf — AB (ref 0–10)

## 2019-06-14 LAB — URINALYSIS, ROUTINE W REFLEX MICROSCOPIC
Bilirubin, UA: NEGATIVE
Glucose, UA: NEGATIVE
Ketones, UA: NEGATIVE
Nitrite, UA: NEGATIVE
RBC, UA: NEGATIVE
Specific Gravity, UA: 1.02 (ref 1.005–1.030)
Urobilinogen, Ur: 0.2 mg/dL (ref 0.2–1.0)
pH, UA: 7 (ref 5.0–7.5)

## 2019-06-14 LAB — TOXOPLASMA ANTIBODIES- IGG AND  IGM
Toxoplasma Antibody- IgM: <3 AU/mL (ref 0.0–7.9)
Toxoplasma IgG Ratio: <3 IU/mL (ref 0.0–7.1)

## 2019-06-14 LAB — VARICELLA ZOSTER ANTIBODY, IGG: Varicella zoster IgG: 705 index (ref >165)

## 2019-06-14 LAB — ANTIBODY SCREEN: Antibody Screen: NEGATIVE

## 2019-06-14 LAB — ABO AND RH: Rh Factor: POSITIVE

## 2019-06-14 LAB — RUBELLA SCREEN: Rubella Antibodies, IGG: 1.47 index (ref >0.99)

## 2019-06-14 LAB — HIV ANTIBODY (ROUTINE TESTING W REFLEX): HIV Screen 4th Generation wRfx: NONREACTIVE

## 2019-06-14 LAB — RPR: RPR Ser Ql: NONREACTIVE

## 2019-06-14 LAB — HEPATITIS B SURFACE ANTIGEN: Hepatitis B Surface Ag: NEGATIVE

## 2019-06-16 LAB — GC/CHLAMYDIA PROBE AMP
Chlamydia trachomatis, NAA: NEGATIVE
Neisseria Gonorrhoeae by PCR: NEGATIVE

## 2019-06-17 LAB — CULTURE, OB URINE

## 2019-06-17 LAB — URINE CULTURE, OB REFLEX

## 2019-06-18 LAB — NICOTINE SCREEN, URINE: Cotinine Ql Scrn, Ur: NEGATIVE ng/mL

## 2019-06-18 LAB — DRUG PROFILE, UR, 9 DRUGS (LABCORP)
Amphetamines, Urine: NEGATIVE ng/mL
Barbiturate Quant, Ur: NEGATIVE ng/mL
Benzodiazepine Quant, Ur: NEGATIVE ng/mL
Cannabinoid Quant, Ur: POSITIVE — AB
Cocaine (Metab.): NEGATIVE ng/mL
Methadone Screen, Urine: NEGATIVE ng/mL
Opiate Quant, Ur: NEGATIVE ng/mL
PCP Quant, Ur: NEGATIVE ng/mL
Propoxyphene: NEGATIVE ng/mL

## 2019-07-08 ENCOUNTER — Ambulatory Visit (INDEPENDENT_AMBULATORY_CARE_PROVIDER_SITE_OTHER): Payer: Medicaid Other | Admitting: Certified Nurse Midwife

## 2019-07-08 ENCOUNTER — Encounter: Payer: Self-pay | Admitting: Certified Nurse Midwife

## 2019-07-08 ENCOUNTER — Other Ambulatory Visit: Payer: Self-pay

## 2019-07-08 VITALS — BP 119/73 | HR 92 | Wt 136.5 lb

## 2019-07-08 DIAGNOSIS — Z3481 Encounter for supervision of other normal pregnancy, first trimester: Secondary | ICD-10-CM

## 2019-07-08 DIAGNOSIS — Z3482 Encounter for supervision of other normal pregnancy, second trimester: Secondary | ICD-10-CM | POA: Diagnosis not present

## 2019-07-08 MED ORDER — ASPIRIN EC 81 MG PO TBEC: 81.0000 mg | DELAYED_RELEASE_TABLET | Freq: Every day | ORAL | 2 refills | Status: DC

## 2019-07-08 NOTE — Patient Instructions (Signed)

## 2019-07-08 NOTE — Progress Notes (Signed)
NEW OB HISTORY AND PHYSICAL  SUBJECTIVE:       Tamara Bishop is a 23 y.o. G75P1001 female, Patient's last menstrual period was 04/14/2019 (approximate)., Estimated Date of Delivery: 01/18/20, [redacted]w[redacted]d, presents today for establishment of Prenatal Care. She has no unusual complaints   Gynecologic History Patient's last menstrual period was 04/14/2019 (approximate). Normal Contraception: none Last Pap: 04/16/19 Results were: normal  Obstetric History OB History  Gravida Para Term Preterm AB Living  2 1 1  0 0 1  SAB TAB Ectopic Multiple Live Births  0 0 0 0 1    # Outcome Date GA Lbr Len/2nd Weight Sex Delivery Anes PTL Lv  2 Current           1 Term 10/17/18 [redacted]w[redacted]d / 00:46 6 lb 8.8 oz (2.97 kg) F Vag-Spont EPI  LIV     Complications: Preeclampsia    Past Medical History:  Diagnosis Date  . Adjustment disorder with anxiety   . Allergic rhinitis   . Anxiety   . Cigarette smoker   . Depression   . Insomnia   . Tobacco abuse     Past Surgical History:  Procedure Laterality Date  . NO PAST SURGERIES    . WISDOM TOOTH EXTRACTION      Current Outpatient Medications on File Prior to Visit  Medication Sig Dispense Refill  . ondansetron (ZOFRAN ODT) 4 MG disintegrating tablet Take 1 tablet (4 mg total) by mouth every 6 (six) hours as needed for nausea. 20 tablet 0  . Prenatal Vit-Fe Fumarate-FA (MULTIVITAMIN-PRENATAL) 27-0.8 MG TABS tablet Take 1 tablet by mouth daily at 12 noon.    Marland Kitchen aspirin EC 81 MG tablet Take 1 tablet (81 mg total) by mouth daily. Take after 12 weeks for prevention of preeclampssia later in pregnancy (Patient not taking: Reported on 07/08/2019) 300 tablet 2   No current facility-administered medications on file prior to visit.     Allergies  Allergen Reactions  . Azithromycin Other (See Comments)    Felt like her stomach was going to come out of her body    Social History   Socioeconomic History  . Marital status: Significant Other    Spouse name:  Quentin Ore  . Number of children: Not on file  . Years of education: Not on file  . Highest education level: Not on file  Occupational History  . Not on file  Social Needs  . Financial resource strain: Not hard at all  . Food insecurity    Worry: Never true    Inability: Never true  . Transportation needs    Medical: No    Non-medical: No  Tobacco Use  . Smoking status: Former Smoker    Packs/day: 0.50    Years: 1.00    Pack years: 0.50    Types: Cigarettes  . Smokeless tobacco: Never Used  Substance and Sexual Activity  . Alcohol use: No  . Drug use: No  . Sexual activity: Yes    Birth control/protection: None  Lifestyle  . Physical activity    Days per week: 5 days    Minutes per session: Not on file  . Stress: Not at all  Relationships  . Social connections    Talks on phone: More than three times a week    Gets together: More than three times a week    Attends religious service: More than 4 times per year    Active member of club or organization: Yes  Attends meetings of clubs or organizations: Never    Relationship status: Living with partner  . Intimate partner violence    Fear of current or ex partner: No    Emotionally abused: No    Physically abused: No    Forced sexual activity: No  Other Topics Concern  . Not on file  Social History Narrative  . Not on file    Family History  Problem Relation Age of Onset  . Lupus Mother   . Diabetes Father   . Breast cancer Maternal Grandmother   . Cancer Maternal Grandmother         breast  . Diabetes Paternal Grandmother   . Diabetes Paternal Grandfather   . Lupus Sister   . Heart disease Maternal Grandfather   . Ovarian cancer Neg Hx   . Colon cancer Neg Hx     The following portions of the patient's history were reviewed and updated as appropriate: allergies, current medications, past OB history, past medical history, past surgical history, past family history, past social history, and problem  list.    OBJECTIVE: Initial Physical Exam (New OB)  GENERAL APPEARANCE: alert, well appearing, in no apparent distress, oriented to person, place and time HEAD: normocephalic, atraumatic MOUTH: mucous membranes moist, pharynx normal without lesions THYROID: no thyromegaly or masses present BREASTS: no masses noted, no significant tenderness, no palpable axillary nodes, no skin changes LUNGS: clear to auscultation, no wheezes, rales or rhonchi, symmetric air entry HEART: regular rate and rhythm, no murmurs ABDOMEN: soft, nontender, nondistended, no abnormal masses, no epigastric pain and FHT present EXTREMITIES: no redness or tenderness in the calves or thighs SKIN: normal coloration and turgor, no rashes LYMPH NODES: no adenopathy palpable NEUROLOGIC: alert, oriented, normal speech, no focal findings or movement disorder noted  PELVIC EXAM EXTERNAL GENITALIA: deferred pt has tested pelvis and not due for pap   ASSESSMENT: Normal pregnancy  PLAN: New OB counseling: The patient has been given an overview regarding routine prenatal care. Recommendations regarding diet, weight gain, and exercise in pregnancy were given. Prenatal testing, optional genetic testing, carrier screening, and ultrasound use in pregnancy were reviewed. Panorama testing today. Benefits of Breast Feeding were discussed. The patient is encouraged to consider nursing her baby post partum.  [name], CNM

## 2019-07-09 LAB — CBC
Hematocrit: 38.5 % (ref 34.0–46.6)
Hemoglobin: 12.9 g/dL (ref 11.1–15.9)
MCH: 28.9 pg (ref 26.6–33.0)
MCHC: 33.5 g/dL (ref 31.5–35.7)
MCV: 86 fL (ref 79–97)
Platelets: 213 10*3/uL (ref 150–450)
RBC: 4.47 x10E6/uL (ref 3.77–5.28)
RDW: 12.5 % (ref 11.7–15.4)
WBC: 11 10*3/uL — ABNORMAL HIGH (ref 3.4–10.8)

## 2019-08-02 NOTE — L&D Delivery Note (Signed)
       Delivery Note   Tamara Bishop is a 24 y.o. G2P2002 at 109w5d Estimated Date of Delivery: 01/18/20  PRE-OPERATIVE DIAGNOSIS:  1) [redacted]w[redacted]d pregnancy. Mild pre eclampsia   POST-OPERATIVE DIAGNOSIS:  1) [redacted]w[redacted]d pregnancy s/p Vaginal, Spontaneous ,    Delivery Type: Vaginal, Spontaneous    Delivery Anesthesia: Epidural   Labor Complications:  nuchal cord x 1    ESTIMATED BLOOD LOSS: 300  ml    FINDINGS:   1) female infant, Apgar scores of   8 at 1 minute and   9 at 5 minutes. Birthweight pending infant remains skin to skin.  2) Nuchal cord: no  SPECIMENS:   PLACENTA:   Appearance: Intact , 3 vessel cord   Removal: Spontaneous      Disposition:  per protocol   DISPOSITION:  Infant to left in stable condition in the delivery room, with L&D personnel and mother,  NARRATIVE SUMMARY: Labor course:  [name] is a [id number] at [redacted]w[redacted]d who presented for induction of labor.  She progressed well in labor with Cytotec, AROM and pitocin.  She received the appropriate epidural anesthesia and proceeded to complete dilation. She evidenced good maternal expulsive effort during the second stage. She went on to deliver a viable female infant "Parker" in ROT position with a tight nuchal cord x 1 reduced. . The placenta delivered without problems and was noted to be complete. A perineal and vaginal examination was performed. Lacerations: None . The patient tolerated this well.  [name], CNM  01/16/2020 4:06 AM

## 2019-08-05 ENCOUNTER — Ambulatory Visit (INDEPENDENT_AMBULATORY_CARE_PROVIDER_SITE_OTHER): Payer: Medicaid Other | Admitting: Certified Nurse Midwife

## 2019-08-05 ENCOUNTER — Encounter: Payer: Self-pay | Admitting: Certified Nurse Midwife

## 2019-08-05 ENCOUNTER — Other Ambulatory Visit: Payer: Self-pay

## 2019-08-05 VITALS — BP 112/68 | HR 93 | Wt 140.6 lb

## 2019-08-05 DIAGNOSIS — Z3482 Encounter for supervision of other normal pregnancy, second trimester: Secondary | ICD-10-CM

## 2019-08-05 LAB — POCT URINALYSIS DIPSTICK OB
Bilirubin, UA: NEGATIVE
Blood, UA: NEGATIVE
Glucose, UA: NEGATIVE
Ketones, UA: NEGATIVE
Leukocytes, UA: NEGATIVE
Nitrite, UA: NEGATIVE
POC,PROTEIN,UA: NEGATIVE
Spec Grav, UA: 1.02 (ref 1.010–1.025)
Urobilinogen, UA: 0.2 E.U./dL
pH, UA: 5 (ref 5.0–8.0)

## 2019-08-05 NOTE — Progress Notes (Signed)
ROB doing well. Discussed u/s next visit. She verbalize understanding. Discussed starting daily Asprin . She verbalizes understanding and agreement. Follow up 4 wks.   [name], CNM

## 2019-08-05 NOTE — Patient Instructions (Signed)

## 2019-08-05 NOTE — Addendum Note (Signed)
Addended by: Brooke Dare on: 08/05/2019 01:33 PM   Modules accepted: Orders

## 2019-09-03 ENCOUNTER — Ambulatory Visit (INDEPENDENT_AMBULATORY_CARE_PROVIDER_SITE_OTHER): Payer: Medicaid Other

## 2019-09-03 ENCOUNTER — Ambulatory Visit (INDEPENDENT_AMBULATORY_CARE_PROVIDER_SITE_OTHER): Payer: Medicaid Other | Admitting: Certified Nurse Midwife

## 2019-09-03 ENCOUNTER — Other Ambulatory Visit: Payer: Self-pay

## 2019-09-03 ENCOUNTER — Encounter: Payer: Self-pay | Admitting: Certified Nurse Midwife

## 2019-09-03 VITALS — BP 119/81 | HR 96 | Wt 148.4 lb

## 2019-09-03 DIAGNOSIS — Z3402 Encounter for supervision of normal first pregnancy, second trimester: Secondary | ICD-10-CM

## 2019-09-03 DIAGNOSIS — Z3482 Encounter for supervision of other normal pregnancy, second trimester: Secondary | ICD-10-CM

## 2019-09-03 LAB — POCT URINALYSIS DIPSTICK OB
Bilirubin, UA: NEGATIVE
Blood, UA: NEGATIVE
Glucose, UA: NEGATIVE
Ketones, UA: NEGATIVE
Leukocytes, UA: NEGATIVE
Nitrite, UA: NEGATIVE
POC,PROTEIN,UA: NEGATIVE
Spec Grav, UA: 1.015 (ref 1.010–1.025)
Urobilinogen, UA: 0.2 E.U./dL
pH, UA: 5 (ref 5.0–8.0)

## 2019-09-03 NOTE — Patient Instructions (Signed)

## 2019-09-03 NOTE — Addendum Note (Signed)
Addended by: Brooke Dare on: 09/03/2019 04:01 PM   Modules accepted: Orders

## 2019-09-03 NOTE — Lactation Note (Signed)
Lactation Consultation Note  Patient Name: Tamara Bishop Today's Date: 09/03/2019     Maternal Data    Feeding    LATCH Score                   Interventions    Lactation Tools Discussed/Used     Consult Status   Lactation student discussed benefits of breastfeeding per the Ready, Set, Baby curriculum. [name] encouraged to review breastfeeding information on Ready, set, [employer] site and given information for virtual breastfeeding classes.      [name] 09/03/2019, 3:21 PM

## 2019-09-03 NOTE — Progress Notes (Signed)
ROB doing well. Feels good movement. U/s today for anatomy (see below) results reviewed. Pt follow up 4 wks with Marcelino Duster.   [name], CNM   Patient Name: Tamara Bishop DOB: 11-29-1995 MRN: [id number] ULTRASOUND REPORT  Location: [facility] Date of Service: 09/03/2019   Indications:Anatomy Ultrasound Findings:  Mason Jim intrauterine pregnancy is visualized with FHR at 161 BPM. Biometrics give an (U/S) Gestational age of [redacted]w[redacted]d and an (U/S) EDD of 01/01/2020; this correlates with the clinically established Estimated Date of Delivery: 01/18/20  Fetal presentation is Variable.  EFW: 322 g ( 11 oz).  Placenta: posterior. Grade: 1 AFI: subjectively normal.  Anatomic survey is complete and normal; Gender - female.    Right Ovary is normal in appearance. Left Ovary is normal appearance. Survey of the adnexa demonstrates no adnexal masses. There is no free peritoneal fluid in the cul de sac.  Impression: 1. [redacted]w[redacted]d Viable Singleton Intrauterine pregnancy by U/S. 2. (U/S) EDD is consistent with Clinically established Estimated Date of Delivery: 01/18/20 . 3. Normal Anatomy Scan  Recommendations: 1.Clinical correlation with the patient's History and Physical Exam.   Jenine M. Marciano Sequin    RDMS

## 2019-09-04 ENCOUNTER — Telehealth: Payer: Self-pay

## 2019-09-04 NOTE — Telephone Encounter (Signed)
mychart message sent to patient

## 2019-09-06 ENCOUNTER — Other Ambulatory Visit: Payer: Self-pay | Admitting: Certified Nurse Midwife

## 2019-09-06 DIAGNOSIS — Z3402 Encounter for supervision of normal first pregnancy, second trimester: Secondary | ICD-10-CM

## 2019-10-03 ENCOUNTER — Encounter: Payer: Medicaid Other | Admitting: Certified Nurse Midwife

## 2019-10-03 ENCOUNTER — Telehealth: Payer: Self-pay

## 2019-10-03 ENCOUNTER — Other Ambulatory Visit: Payer: Self-pay

## 2019-10-03 ENCOUNTER — Ambulatory Visit (INDEPENDENT_AMBULATORY_CARE_PROVIDER_SITE_OTHER): Payer: Medicaid Other | Admitting: Certified Nurse Midwife

## 2019-10-03 VITALS — BP 120/76 | HR 72 | Wt 158.4 lb

## 2019-10-03 DIAGNOSIS — Z8759 Personal history of other complications of pregnancy, childbirth and the puerperium: Secondary | ICD-10-CM

## 2019-10-03 DIAGNOSIS — O26892 Other specified pregnancy related conditions, second trimester: Secondary | ICD-10-CM

## 2019-10-03 DIAGNOSIS — Z3482 Encounter for supervision of other normal pregnancy, second trimester: Secondary | ICD-10-CM

## 2019-10-03 DIAGNOSIS — Z3A24 24 weeks gestation of pregnancy: Secondary | ICD-10-CM

## 2019-10-03 DIAGNOSIS — R102 Pelvic and perineal pain: Secondary | ICD-10-CM

## 2019-10-03 NOTE — Progress Notes (Signed)
ROB-Taking baby aspirin daily. Reports intermittent pelvic pain. Discussed home treatment measure. Baseline pre-eclampsia labs collected today.Anticipatory guidance regarding course of prenatal care. Reviewed red flag symptoms and when to call. RTC x 4 weeks for 28 week labs, TDaP, and ROB or sooner if needed.

## 2019-10-03 NOTE — Patient Instructions (Signed)

## 2019-10-03 NOTE — Telephone Encounter (Signed)
mychart message sent to patient

## 2019-10-04 LAB — COMPREHENSIVE METABOLIC PANEL
ALT: 7 IU/L (ref 0–32)
AST: 10 IU/L (ref 0–40)
Albumin/Globulin Ratio: 1.5 (ref 1.2–2.2)
Albumin: 3.6 g/dL — ABNORMAL LOW (ref 3.9–5.0)
Alkaline Phosphatase: 72 IU/L (ref 39–117)
BUN/Creatinine Ratio: 17 (ref 9–23)
BUN: 9 mg/dL (ref 6–20)
Bilirubin Total: <0.2 mg/dL (ref 0.0–1.2)
CO2: 20 mmol/L (ref 20–29)
Calcium: 8.7 mg/dL (ref 8.7–10.2)
Chloride: 104 mmol/L (ref 96–106)
Creatinine, Ser: 0.52 mg/dL — ABNORMAL LOW (ref 0.57–1.00)
GFR calc Af Amer: 156 mL/min/{1.73_m2} (ref >59)
GFR calc non Af Amer: 135 mL/min/{1.73_m2} (ref >59)
Globulin, Total: 2.4 g/dL (ref 1.5–4.5)
Glucose: 91 mg/dL (ref 65–99)
Potassium: 3.6 mmol/L (ref 3.5–5.2)
Sodium: 138 mmol/L (ref 134–144)
Total Protein: 6 g/dL (ref 6.0–8.5)

## 2019-10-04 LAB — CBC
Hematocrit: 33.9 % — ABNORMAL LOW (ref 34.0–46.6)
Hemoglobin: 11.2 g/dL (ref 11.1–15.9)
MCH: 29 pg (ref 26.6–33.0)
MCHC: 33 g/dL (ref 31.5–35.7)
MCV: 88 fL (ref 79–97)
Platelets: 167 10*3/uL (ref 150–450)
RBC: 3.86 x10E6/uL (ref 3.77–5.28)
RDW: 12.1 % (ref 11.7–15.4)
WBC: 14.2 10*3/uL — ABNORMAL HIGH (ref 3.4–10.8)

## 2019-10-04 LAB — PROTEIN / CREATININE RATIO, URINE
Creatinine, Urine: 144.4 mg/dL
Protein, Ur: 21.9 mg/dL
Protein/Creat Ratio: 152 mg/g creat (ref 0–200)

## 2019-10-10 ENCOUNTER — Encounter: Payer: Medicaid Other | Admitting: Certified Nurse Midwife

## 2019-10-10 ENCOUNTER — Ambulatory Visit (INDEPENDENT_AMBULATORY_CARE_PROVIDER_SITE_OTHER): Payer: Medicaid Other | Admitting: Certified Nurse Midwife

## 2019-10-10 ENCOUNTER — Other Ambulatory Visit: Payer: Self-pay

## 2019-10-10 VITALS — BP 109/75 | HR 91 | Wt 161.0 lb

## 2019-10-10 DIAGNOSIS — Z3492 Encounter for supervision of normal pregnancy, unspecified, second trimester: Secondary | ICD-10-CM

## 2019-10-10 DIAGNOSIS — Z3A25 25 weeks gestation of pregnancy: Secondary | ICD-10-CM

## 2019-10-10 DIAGNOSIS — R42 Dizziness and giddiness: Secondary | ICD-10-CM

## 2019-10-10 DIAGNOSIS — R0602 Shortness of breath: Secondary | ICD-10-CM

## 2019-10-10 LAB — POCT URINALYSIS DIPSTICK OB
Bilirubin, UA: NEGATIVE
Glucose, UA: NEGATIVE
Ketones, UA: NEGATIVE
Nitrite, UA: NEGATIVE
Spec Grav, UA: 1.01 (ref 1.010–1.025)
Urobilinogen, UA: 0.2 E.U./dL
pH, UA: 7 (ref 5.0–8.0)

## 2019-10-10 NOTE — Progress Notes (Signed)
OB-Patient woke up this morning feeling dizzy "I feel drunk".  Sat O2-99.

## 2019-10-10 NOTE — Progress Notes (Signed)
Subjective:   Tamara Bishop is a 24 y.o. G2P1001 [redacted]w[redacted]d being seen today for work in problem visit.    Patient reports dizziness since this morning, no relief with home treatment measures. "I feel like a drunk woman who needs to hang her leg off the bed to help the room stop spinning".   No contractions, vaginal bleeding or leaking of fluid.  Reports good fetal movement.  Denies difficulty breathing or respiratory distress, chest pain, abdominal pain, dysuria, and leg pain or swelling.   The following portions of the patient's history were reviewed and updated as appropriate: allergies, current medications, past family history, past medical history, past social history, past surgical history and problem list.   Review of Systems:  ROS negative except as noted above. Information obtain from patient.   Objective:   BP 109/75   Pulse 91   Wt 161 lb (73 kg)   LMP 04/14/2019 (Approximate)   BMI 29.93 kg/m    Orthostatic blood pressures:   Standing: 122/74  Supine: 102/64  Sitting: 118/66  Oxygen saturation: 99% on room air  Random fingerstick blood glucose: 82  FHT: Present  Fetal Movement: Movement: Present    Abdomen:  soft, gravid, appropriate for gestational age,non-tender    Assessment:   Pregnancy:  G2P1001 at [redacted]w[redacted]d  1. [redacted] weeks gestation of pregnancy  - POC Urinalysis Dipstick OB - Ambulatory referral to Cardiology  2. Second trimester pregnancy  - Ambulatory referral to Cardiology  3. Shortness of breath  - Ambulatory referral to Cardiology  4. Dizziness  - Ambulatory referral to Cardiology  Plan:   Discussed home treatment measures.   Referral to cardiology, see orders.   Reviewed red flag symptoms and when to call.   Preterm labor symptoms: vaginal bleeding, contractions and leaking of fluid reviewed in detail.  Fetal movement precautions reviewed.  Follow up as previously scheduled or sooner if needed.    Gunnar Bulla,  CNM [facility], [facility] 10/12/19 3:28 PM

## 2019-10-10 NOTE — Patient Instructions (Signed)
Fetal Movement Counts Patient Name: ________________________________________________ Patient Due Date: ____________________ What is a fetal movement count?  A fetal movement count is the number of times that you feel your baby move during a certain amount of time. This may also be called a fetal kick count. A fetal movement count is recommended for every pregnant woman. You may be asked to start counting fetal movements as early as week 28 of your pregnancy. Pay attention to when your baby is most active. You may notice your baby's sleep and wake cycles. You may also notice things that make your baby move more. You should do a fetal movement count:  When your baby is normally most active.  At the same time each day. A good time to count movements is while you are resting, after having something to eat and drink. How do I count fetal movements? 1. Find a quiet, comfortable area. Sit, or lie down on your side. 2. Write down the date, the start time and stop time, and the number of movements that you felt between those two times. Take this information with you to your health care visits. 3. Write down your start time when you feel the first movement. 4. Count kicks, flutters, swishes, rolls, and jabs. You should feel at least 10 movements. 5. You may stop counting after you have felt 10 movements, or if you have been counting for 2 hours. Write down the stop time. 6. If you do not feel 10 movements in 2 hours, contact your health care provider for further instructions. Your health care provider may want to do additional tests to assess your baby's well-being. Contact a health care provider if:  You feel fewer than 10 movements in 2 hours.  Your baby is not moving like he or she usually does. Date: ____________ Start time: ____________ Stop time: ____________ Movements: ____________ Date: ____________ Start time: ____________ Stop time: ____________ Movements: ____________ Date: ____________  Start time: ____________ Stop time: ____________ Movements: ____________ Date: ____________ Start time: ____________ Stop time: ____________ Movements: ____________ Date: ____________ Start time: ____________ Stop time: ____________ Movements: ____________ Date: ____________ Start time: ____________ Stop time: ____________ Movements: ____________ Date: ____________ Start time: ____________ Stop time: ____________ Movements: ____________ Date: ____________ Start time: ____________ Stop time: ____________ Movements: ____________ Date: ____________ Start time: ____________ Stop time: ____________ Movements: ____________ This information is not intended to replace advice given to you by your health care provider. Make sure you discuss any questions you have with your health care provider. Document Revised: 03/07/2019 Document Reviewed: 03/07/2019 Else[employer] Patient Education  2020 [employer]  

## 2019-10-10 NOTE — Telephone Encounter (Signed)
Pt called in and stated that she is feeling dizzy and is requesting a call back from the nurse. Please advise

## 2019-11-04 ENCOUNTER — Encounter: Payer: Medicaid Other | Admitting: Certified Nurse Midwife

## 2019-11-04 ENCOUNTER — Ambulatory Visit (INDEPENDENT_AMBULATORY_CARE_PROVIDER_SITE_OTHER): Payer: Medicaid Other | Admitting: Certified Nurse Midwife

## 2019-11-04 ENCOUNTER — Ambulatory Visit (INDEPENDENT_AMBULATORY_CARE_PROVIDER_SITE_OTHER): Payer: Medicaid Other | Admitting: Cardiology

## 2019-11-04 ENCOUNTER — Other Ambulatory Visit: Payer: Medicaid Other

## 2019-11-04 ENCOUNTER — Encounter: Payer: Self-pay | Admitting: Cardiology

## 2019-11-04 ENCOUNTER — Encounter: Payer: Self-pay | Admitting: Certified Nurse Midwife

## 2019-11-04 ENCOUNTER — Other Ambulatory Visit: Payer: Self-pay

## 2019-11-04 VITALS — BP 115/78 | HR 91 | Wt 165.2 lb

## 2019-11-04 VITALS — BP 131/79 | HR 91 | Ht 61.5 in | Wt 167.4 lb

## 2019-11-04 DIAGNOSIS — R42 Dizziness and giddiness: Secondary | ICD-10-CM

## 2019-11-04 DIAGNOSIS — R0602 Shortness of breath: Secondary | ICD-10-CM | POA: Diagnosis not present

## 2019-11-04 DIAGNOSIS — R011 Cardiac murmur, unspecified: Secondary | ICD-10-CM

## 2019-11-04 DIAGNOSIS — Z3492 Encounter for supervision of normal pregnancy, unspecified, second trimester: Secondary | ICD-10-CM | POA: Diagnosis not present

## 2019-11-04 LAB — POCT URINALYSIS DIPSTICK OB
Bilirubin, UA: NEGATIVE
Blood, UA: NEGATIVE
Glucose, UA: NEGATIVE
Ketones, UA: NEGATIVE
Leukocytes, UA: NEGATIVE
Nitrite, UA: NEGATIVE
POC,PROTEIN,UA: NEGATIVE
Spec Grav, UA: 1.01 (ref 1.010–1.025)
Urobilinogen, UA: 0.2 E.U./dL
pH, UA: 5 (ref 5.0–8.0)

## 2019-11-04 MED ORDER — TETANUS-DIPHTH-ACELL PERTUSSIS 5-2.5-18.5 LF-MCG/0.5 IM SUSP: 0.5000 mL | Freq: Once | INTRAMUSCULAR | Status: DC

## 2019-11-04 NOTE — Patient Instructions (Signed)
Td (Tetanus, Diphtheria) Vaccine: What You Need to Know 1. Why get vaccinated? Td vaccine can prevent tetanus and diphtheria. Tetanus enters the body through cuts or wounds. Diphtheria spreads from person to person.  TETANUS (T) causes painful stiffening of the muscles. Tetanus can lead to serious health problems, including being unable to open the mouth, having trouble swallowing and breathing, or death.  DIPHTHERIA (D) can lead to difficulty breathing, heart failure, paralysis, or death. 2. Td vaccine Td is only for children 7 years and older, adolescents, and adults.  Td is usually given as a booster dose every 10 years, but it can also be given earlier after a severe and dirty wound or burn. Another vaccine, called Tdap, that protects against pertussis, also known as "whooping cough," in addition to tetanus and diphtheria, may be used instead of Td.  Td may be given at the same time as other vaccines. 3. Talk with your health care provider Tell your vaccine provider if the person getting the vaccine:  Has had an allergic reaction after a previous dose of any vaccine that protects against tetanus or diphtheria, or has any severe, life-threatening allergies.  Has ever had Guillain-Barr Syndrome (also called GBS).  Has had severe pain or swelling after a previous dose of any vaccine that protects against tetanus or diphtheria. In some cases, your health care provider may decide to postpone Td vaccination to a future visit.  People with minor illnesses, such as a cold, may be vaccinated. People who are moderately or severely ill should usually wait until they recover before getting Td vaccine.  Your health care provider can give you more information. 4. Risks of a vaccine reaction  Pain, redness, or swelling where the shot was given, mild fever, headache, feeling tired, and nausea, vomiting, diarrhea, or stomachache sometimes happen after Td vaccine. People sometimes faint after medical  procedures, including vaccination. Tell your provider if you feel dizzy or have vision changes or ringing in the ears.  As with any medicine, there is a very remote chance of a vaccine causing a severe allergic reaction, other serious injury, or death. 5. What if there is a serious problem? An allergic reaction could occur after the vaccinated person leaves the clinic. If you see signs of a severe allergic reaction (hives, swelling of the face and throat, difficulty breathing, a fast heartbeat, dizziness, or weakness), call 9-1-1 and get the person to the nearest hospital.  For other signs that concern you, call your health care provider.  Adverse reactions should be reported to the Vaccine Adverse Event Reporting System (VAERS). Your health care provider will usually file this report, or you can do it yourself. Visit the VAERS website at www.vaers.hhs.gov or call 1-800-822-7967. VAERS is only for reporting reactions, and VAERS staff do not give medical advice. 6. The National Vaccine Injury Compensation Program The National Vaccine Injury Compensation Program (VICP) is a federal program that was created to compensate people who may have been injured by certain vaccines. Visit the VICP website at www.hrsa.gov/vaccinecompensation or call 1-800-338-2382 to learn about the program and about filing a claim. There is a time limit to file a claim for compensation. 7. How can I learn more?  Ask your health care provider.  Call your local or state health department.  Contact the Centers for Disease Control and Prevention (CDC): ? Call 1-800-232-4636 (1-800-CDC-INFO) or ? Visit CDC's website at www.cdc.gov/vaccines Vaccine Information Statement Td Vaccine (10/31/18) This information is not intended to replace advice given   to you by your health care provider. Make sure you discuss any questions you have with your health care provider. Document Revised: 12/10/2018 Document Reviewed: 11/12/2018 Elsevier  Patient Education  2020 Else[employer] Inc. Glucose Tolerance Test During Pregnancy Why am I having this test? The glucose tolerance test (GTT) is done to check how your body processes sugar (glucose). This is one of several tests used to diagnose diabetes that develops during pregnancy (gestational diabetes mellitus). Gestational diabetes is a temporary form of diabetes that some women develop during pregnancy. It usually occurs during the second trimester of pregnancy and goes away after delivery. Testing (screening) for gestational diabetes usually occurs between 24 and 28 weeks of pregnancy. You may have the GTT test after having a 1-hour glucose screening test if the results from that test indicate that you may have gestational diabetes. You may also have this test if:  You have a history of gestational diabetes.  You have a history of giving birth to very large babies or have experienced repeated fetal loss (stillbirth).  You have signs and symptoms of diabetes, such as: ? Changes in your vision. ? Tingling or numbness in your hands or feet. ? Changes in hunger, thirst, and urination that are not otherwise explained by your pregnancy. What is being tested? This test measures the amount of glucose in your blood at different times during a period of 3 hours. This indicates how well your body is able to process glucose. What kind of sample is taken?  Blood samples are required for this test. They are usually collected by inserting a needle into a blood vessel. How do I prepare for this test?  For 3 days before your test, eat normally. Have plenty of carbohydrate-rich foods.  Follow instructions from your health care provider about: ? Eating or drinking restrictions on the day of the test. You may be asked to not eat or drink anything other than water (fast) starting 8-10 hours before the test. ? Changing or stopping your regular medicines. Some medicines may interfere with this test. Tell a  health care provider about:  All medicines you are taking, including vitamins, herbs, eye drops, creams, and over-the-counter medicines.  Any blood disorders you have.  Any surgeries you have had.  Any medical conditions you have. What happens during the test? First, your blood glucose will be measured. This is referred to as your fasting blood glucose, since you fasted before the test. Then, you will drink a glucose solution that contains a certain amount of glucose. Your blood glucose will be measured again 1, 2, and 3 hours after drinking the solution. This test takes about 3 hours to complete. You will need to stay at the testing location during this time. During the testing period:  Do not eat or drink anything other than the glucose solution.  Do not exercise.  Do not use any products that contain nicotine or tobacco, such as cigarettes and e-cigarettes. If you need help stopping, ask your health care provider. The testing procedure may vary among health care providers and hospitals. How are the results reported? Your results will be reported as milligrams of glucose per deciliter of blood (mg/dL) or millimoles per liter (mmol/L). Your health care provider will compare your results to normal ranges that were established after testing a large group of people (reference ranges). Reference ranges may vary among labs and hospitals. For this test, common reference ranges are:  Fasting: less than 95-105 mg/dL (5.3-5.8 mmol/L).  1 hour   after drinking glucose: less than 180-190 mg/dL (10.0-10.5 mmol/L).  2 hours after drinking glucose: less than 155-165 mg/dL (8.6-9.2 mmol/L).  3 hours after drinking glucose: 140-145 mg/dL (7.8-8.1 mmol/L). What do the results mean? Results within reference ranges are considered normal, meaning that your glucose levels are well-controlled. If two or more of your blood glucose levels are high, you may be diagnosed with gestational diabetes. If only one  level is high, your health care provider may suggest repeat testing or other tests to confirm a diagnosis. Talk with your health care provider about what your results mean. Questions to ask your health care provider Ask your health care provider, or the department that is doing the test:  When will my results be ready?  How will I get my results?  What are my treatment options?  What other tests do I need?  What are my next steps? Summary  The glucose tolerance test (GTT) is one of several tests used to diagnose diabetes that develops during pregnancy (gestational diabetes mellitus). Gestational diabetes is a temporary form of diabetes that some women develop during pregnancy.  You may have the GTT test after having a 1-hour glucose screening test if the results from that test indicate that you may have gestational diabetes. You may also have this test if you have any symptoms or risk factors for gestational diabetes.  Talk with your health care provider about what your results mean. This information is not intended to replace advice given to you by your health care provider. Make sure you discuss any questions you have with your health care provider. Document Revised: 11/08/2018 Document Reviewed: 02/27/2017 Elsevier Patient Education  2020 Elsevier Inc.  

## 2019-11-04 NOTE — Progress Notes (Signed)
Cardiology Office Note:    Date:  11/04/2019   ID:  Tamara Bishop, DOB 09-04-1995, MRN [id number]  PCP:  Patient, No Pcp Per  Cardiologist:  Tamara Sable, MD  Electrophysiologist:  None   Referring MD: Tamara Bishop*   Chief Complaint  Patient presents with  . New Patient (Initial Visit)    SOB preceding dizziness; Meds verbally reviewed with patient.      History of Present Illness:    Tamara Bishop is a 24 y.o. female with a hx of anxiety, former smoker, currently [redacted] weeks pregnant who presents due to dizziness.  Patient states having symptoms of dizziness typically when she changes positions such as bending down to tie her shoelace, or standing from a seated position.  She felt very dizzy during orthostatic vitals today in the office.  She also endorses shortness of breath which has been ongoing for about a year now.  Shortness of breath is typically worse with exertion, and not present at rest.  She states the shortness of breath has gotten worse as her pregnancy has progressed.  She denies edema, denies any history of heart disease.  States having preeclampsia during her last pregnancy requiring magnesium.  Past Medical History:  Diagnosis Date  . Adjustment disorder with anxiety   . Allergic rhinitis   . Anxiety   . Cigarette smoker   . Depression   . Insomnia   . Tobacco abuse     Past Surgical History:  Procedure Laterality Date  . NO PAST SURGERIES    . WISDOM TOOTH EXTRACTION      Current Medications: Current Meds  Medication Sig  . aspirin EC 81 MG tablet Take 1 tablet (81 mg total) by mouth daily. Take after 12 weeks for prevention of preeclampssia later in pregnancy  . Prenatal Vit-Fe Fumarate-FA (MULTIVITAMIN-PRENATAL) 27-0.8 MG TABS tablet Take 1 tablet by mouth daily at 12 noon.   Current Facility-Administered Medications for the 11/04/19 encounter (Office Visit) with Tamara Sable, MD  Medication  . Tdap (BOOSTRIX) injection  0.5 mL     Allergies:   Azithromycin   Social History   Socioeconomic History  . Marital status: Significant Other    Spouse name: Tamara Bishop  . Number of children: Not on file  . Years of education: Not on file  . Highest education level: Not on file  Occupational History  . Not on file  Tobacco Use  . Smoking status: Former Smoker    Packs/day: 0.50    Years: 1.00    Pack years: 0.50    Types: Cigarettes  . Smokeless tobacco: Never Used  Substance and Sexual Activity  . Alcohol use: No  . Drug use: No  . Sexual activity: Yes    Birth control/protection: None  Other Topics Concern  . Not on file  Social History Narrative  . Not on file   Social Determinants of Health   Financial Resource Strain:   . Difficulty of Paying Living Expenses:   Food Insecurity:   . Worried About Charity fundraiser in the Last Year:   . Arboriculturist in the Last Year:   Transportation Needs:   . Film/video editor (Medical):   Marland Kitchen Lack of Transportation (Non-Medical):   Physical Activity:   . Days of Exercise per Week:   . Minutes of Exercise per Session:   Stress:   . Feeling of Stress :   Social Connections:   . Frequency of Communication  with Friends and Family:   . Frequency of Social Gatherings with Friends and Family:   . Attends Religious Services:   . Active Member of Clubs or Organizations:   . Attends Banker Meetings:   Marland Kitchen Marital Status:      Family History: The patient's family history includes Breast cancer in her maternal grandmother; Cancer in her maternal grandmother; Diabetes in her father, paternal grandfather, and paternal grandmother; Heart disease in her maternal grandfather; Lupus in her mother and sister. There is no history of Ovarian cancer or Colon cancer.  ROS:   Please see the history of present illness.     All other systems reviewed and are negative.  EKGs/Labs/Other Studies Reviewed:    The following studies were reviewed  today:   EKG:  EKG is  ordered today.  The ekg ordered today demonstrates sinus rhythm, nonspecific T wave changes.  Recent Labs: 04/16/2019: TSH 1.250 10/03/2019: ALT 7; BUN 9; Creatinine, Ser 0.52; Hemoglobin 11.2; Platelets 167; Potassium 3.6; Sodium 138  Recent Lipid Panel    Component Value Date/Time   CHOL 137 06/02/2015 0940   TRIG 65 06/02/2015 0940   HDL 46 06/02/2015 0940   LDLCALC 78 06/02/2015 0940    Physical Exam:    VS:  BP 131/79 (BP Location: Right Arm, Patient Position: Sitting, Cuff Size: Normal)   Pulse 91   Ht 5' 1.5" (1.562 m)   Wt 167 lb 6 oz (75.9 kg)   LMP 04/14/2019 (Approximate)   SpO2 99%   BMI 31.11 kg/m     Wt Readings from Last 3 Encounters:  11/04/19 167 lb 6 oz (75.9 kg)  11/04/19 165 lb 3 oz (74.9 kg)  10/10/19 161 lb (73 kg)     GEN:  Well nourished, well developed in no acute distress HEENT: Normal NECK: No JVD; No carotid bruits LYMPHATICS: No lymphadenopathy CARDIAC: RRR, 2/6 systolic murmur RESPIRATORY:  Clear to auscultation without rales, wheezing or rhonchi  ABDOMEN: Soft, non-tender, non-distended MUSCULOSKELETAL:  No edema; No deformity  SKIN: Warm and dry NEUROLOGIC:  Alert and oriented x 3 PSYCHIATRIC:  Normal affect    ASSESSMENT:    1. Shortness of breath   2. Dizziness   3. Cardiac murmur    PLAN:    In order of problems listed above:  1. Patient with worsening shortness of breath.  Sometimes at rest and sometimes with exertion.  Overall symptoms of shortness of breath has worsened with progressing pregnancy.  She is also [redacted] weeks pregnant.  It is possible her symptoms are secondary to increase fluid with pregnancy.  We will evaluate with echocardiogram to rule out any structural abnormalities. 2. Patient with dizziness associated with changes in position.  Orthostatic vitals in the office did not reveal any evidence for orthostasis.  She felt dizzy from lying to sitting and also with standing.  Symptoms are more  consistent with benign positional vertigo.  Will refer to primary care provider/ENT for further management.  Findings not consistent with cardiac etiology. 3. Systolic murmur noted on cardiac exam.  Will evaluate for any valvular pathology with echocardiogram as above.  Follow-up after echocardiogram.  This note was generated in part or whole with voice recognition software. Voice recognition is usually quite accurate but there are transcription errors that can and very often do occur. I apologize for any typographical errors that were not detected and corrected.  Medication Adjustments/Labs and Tests Ordered: Current medicines are reviewed at length with the patient today.  Concerns regarding medicines are outlined above.  Orders Placed This Encounter  Procedures  . EKG 12-Lead  . ECHOCARDIOGRAM COMPLETE   No orders of the defined types were placed in this encounter.   Patient Instructions  Medication Instructions:  - Your physician recommends that you continue on your current medications as directed. Please refer to the Current Medication list given to you today.  *If you need a refill on your cardiac medications before your next appointment, please call your pharmacy*   Lab Work: - none ordered  If you have labs (blood work) drawn today and your tests are completely normal, you will receive your results only by: Marland Kitchen MyChart Message (if you have MyChart) OR . A paper copy in the mail If you have any lab test that is abnormal or we need to change your treatment, we will call you to review the results.   Testing/Procedures: - Your physician has requested that you have an echocardiogram. Echocardiography is a painless test that uses sound waves to create images of your heart. It provides your doctor with information about the size and shape of your heart and how well your heart's chambers and valves are working. This procedure takes approximately one hour. There are no restrictions for  this procedure. Occasionally an IV will need to be started during your exam to inject an image enhancing agent. Please make sure to drink some water prior to your test.   Follow-Up: At [facility], you and your health needs are our priority.  As part of our continuing mission to provide you with exceptional heart care, we have created designated Provider Care Teams.  These Care Teams include your primary Cardiologist (physician) and Advanced Practice Providers (APPs -  Physician Assistants and Nurse Practitioners) who all work together to provide you with the care you need, when you need it.  We recommend signing up for the patient portal called "MyChart".  Sign up information is provided on this After Visit Summary.  MyChart is used to connect with patients for Virtual Visits (Telemedicine).  Patients are able to view lab/test results, encounter notes, upcoming appointments, etc.  Non-urgent messages can be sent to your provider as well.   To learn more about what you can do with MyChart, go to [email or web address].    Your next appointment:   As testing is completed   The format for your next appointment:   In Person  Provider:   Debbe Odea, MD   Other Instructions N/a   Echocardiogram An echocardiogram is a procedure that uses painless sound waves (ultrasound) to produce an image of the heart. Images from an echocardiogram can provide important information about:  Signs of coronary artery disease (CAD).  Aneurysm detection. An aneurysm is a weak or damaged part of an artery wall that bulges out from the normal force of blood pumping through the body.  Heart size and shape. Changes in the size or shape of the heart can be associated with certain conditions, including heart failure, aneurysm, and CAD.  Heart muscle function.  Heart valve function.  Signs of a past heart attack.  Fluid buildup around the heart.  Thickening of the heart muscle.  A tumor or  infectious growth around the heart valves. Tell a health care provider about:  Any allergies you have.  All medicines you are taking, including vitamins, herbs, eye drops, creams, and over-the-counter medicines.  Any blood disorders you have.  Any surgeries you have had.  Any medical conditions you have.  Whether you are pregnant or may be pregnant. What are the risks? Generally, this is a safe procedure. However, problems may occur, including:  Allergic reaction to dye (contrast) that may be used during the procedure. What happens before the procedure? No specific preparation is needed. You may eat and drink normally. What happens during the procedure?   An IV tube may be inserted into one of your veins.  You may receive contrast through this tube. A contrast is an injection that improves the quality of the pictures from your heart.  A gel will be applied to your chest.  A wand-like tool (transducer) will be moved over your chest. The gel will help to transmit the sound waves from the transducer.  The sound waves will harmlessly bounce off of your heart to allow the heart images to be captured in real-time motion. The images will be recorded on a computer. The procedure may vary among health care providers and hospitals. What happens after the procedure?  You may return to your normal, everyday life, including diet, activities, and medicines, unless your health care provider tells you not to do that. Summary  An echocardiogram is a procedure that uses painless sound waves (ultrasound) to produce an image of the heart.  Images from an echocardiogram can provide important information about the size and shape of your heart, heart muscle function, heart valve function, and fluid buildup around your heart.  You do not need to do anything to prepare before this procedure. You may eat and drink normally.  After the echocardiogram is completed, you may return to your normal,  everyday life, unless your health care provider tells you not to do that. This information is not intended to replace advice given to you by your health care provider. Make sure you discuss any questions you have with your health care provider. Document Revised: 11/08/2018 Document Reviewed: 08/20/2016 Elsevier Patient Education  2020 [employer].      Signed, Debbe Odea, MD  11/04/2019 5:34 PM    [facility]

## 2019-11-04 NOTE — Progress Notes (Signed)
ROB doing well. Feels good movement. Tdap/BTC/RPR/CPC/Glucose screen today. Discussed BC, information given. Planning on nexplanon. Discussed birth plan, sample given will follow up at next visit. RSB reviewed. See checklist for topics covered. She has cardiology consult today. Follow up 2 wk with Marcelino Duster.   [name], CNM

## 2019-11-04 NOTE — Patient Instructions (Signed)
Medication Instructions:  - Your physician recommends that you continue on your current medications as directed. Please refer to the Current Medication list given to you today.  *If you need a refill on your cardiac medications before your next appointment, please call your pharmacy*   Lab Work: - none ordered  If you have labs (blood work) drawn today and your tests are completely normal, you will receive your results only by: Marland Kitchen MyChart Message (if you have MyChart) OR . A paper copy in the mail If you have any lab test that is abnormal or we need to change your treatment, we will call you to review the results.   Testing/Procedures: - Your physician has requested that you have an echocardiogram. Echocardiography is a painless test that uses sound waves to create images of your heart. It provides your doctor with information about the size and shape of your heart and how well your heart's chambers and valves are working. This procedure takes approximately one hour. There are no restrictions for this procedure. Occasionally an IV will need to be started during your exam to inject an image enhancing agent. Please make sure to drink some water prior to your test.   Follow-Up: At [facility], you and your health needs are our priority.  As part of our continuing mission to provide you with exceptional heart care, we have created designated Provider Care Teams.  These Care Teams include your primary Cardiologist (physician) and Advanced Practice Providers (APPs -  Physician Assistants and Nurse Practitioners) who all work together to provide you with the care you need, when you need it.  We recommend signing up for the patient portal called "MyChart".  Sign up information is provided on this After Visit Summary.  MyChart is used to connect with patients for Virtual Visits (Telemedicine).  Patients are able to view lab/test results, encounter notes, upcoming appointments, etc.  Non-urgent messages  can be sent to your provider as well.   To learn more about what you can do with MyChart, go to [email or web address].    Your next appointment:   As testing is completed   The format for your next appointment:   In Person  Provider:   Kate Sable, MD   Other Instructions N/a   Echocardiogram An echocardiogram is a procedure that uses painless sound waves (ultrasound) to produce an image of the heart. Images from an echocardiogram can provide important information about:  Signs of coronary artery disease (CAD).  Aneurysm detection. An aneurysm is a weak or damaged part of an artery wall that bulges out from the normal force of blood pumping through the body.  Heart size and shape. Changes in the size or shape of the heart can be associated with certain conditions, including heart failure, aneurysm, and CAD.  Heart muscle function.  Heart valve function.  Signs of a past heart attack.  Fluid buildup around the heart.  Thickening of the heart muscle.  A tumor or infectious growth around the heart valves. Tell a health care provider about:  Any allergies you have.  All medicines you are taking, including vitamins, herbs, eye drops, creams, and over-the-counter medicines.  Any blood disorders you have.  Any surgeries you have had.  Any medical conditions you have.  Whether you are pregnant or may be pregnant. What are the risks? Generally, this is a safe procedure. However, problems may occur, including:  Allergic reaction to dye (contrast) that may be used during the procedure. What happens before  the procedure? No specific preparation is needed. You may eat and drink normally. What happens during the procedure?   An IV tube may be inserted into one of your veins.  You may receive contrast through this tube. A contrast is an injection that improves the quality of the pictures from your heart.  A gel will be applied to your chest.  A wand-like tool  (transducer) will be moved over your chest. The gel will help to transmit the sound waves from the transducer.  The sound waves will harmlessly bounce off of your heart to allow the heart images to be captured in real-time motion. The images will be recorded on a computer. The procedure may vary among health care providers and hospitals. What happens after the procedure?  You may return to your normal, everyday life, including diet, activities, and medicines, unless your health care provider tells you not to do that. Summary  An echocardiogram is a procedure that uses painless sound waves (ultrasound) to produce an image of the heart.  Images from an echocardiogram can provide important information about the size and shape of your heart, heart muscle function, heart valve function, and fluid buildup around your heart.  You do not need to do anything to prepare before this procedure. You may eat and drink normally.  After the echocardiogram is completed, you may return to your normal, everyday life, unless your health care provider tells you not to do that. This information is not intended to replace advice given to you by your health care provider. Make sure you discuss any questions you have with your health care provider. Document Revised: 11/08/2018 Document Reviewed: 08/20/2016 Elsevier Patient Education  2020 [employer].

## 2019-11-05 ENCOUNTER — Other Ambulatory Visit: Payer: Self-pay | Admitting: Certified Nurse Midwife

## 2019-11-05 LAB — RPR: RPR Ser Ql: NONREACTIVE

## 2019-11-05 LAB — CBC
Hematocrit: 32.5 % — ABNORMAL LOW (ref 34.0–46.6)
Hemoglobin: 10.9 g/dL — ABNORMAL LOW (ref 11.1–15.9)
MCH: 28.7 pg (ref 26.6–33.0)
MCHC: 33.5 g/dL (ref 31.5–35.7)
MCV: 86 fL (ref 79–97)
Platelets: 173 10*3/uL (ref 150–450)
RBC: 3.8 x10E6/uL (ref 3.77–5.28)
RDW: 11.9 % (ref 11.7–15.4)
WBC: 14.2 10*3/uL — ABNORMAL HIGH (ref 3.4–10.8)

## 2019-11-05 LAB — GLUCOSE, 1 HOUR GESTATIONAL: Gestational Diabetes Screen: 104 mg/dL (ref 65–139)

## 2019-11-05 MED ORDER — FUSION PLUS PO CAPS: 1.0000 | ORAL_CAPSULE | Freq: Every day | ORAL | 6 refills | Status: DC

## 2019-11-21 ENCOUNTER — Other Ambulatory Visit (HOSPITAL_COMMUNITY)
Admission: RE | Admit: 2019-11-21 | Discharge: 2019-11-21 | Disposition: A | Discharge status: Home / Self Care | Payer: Medicaid Other | Source: Ambulatory Visit | Attending: Certified Nurse Midwife | Admitting: Certified Nurse Midwife

## 2019-11-21 ENCOUNTER — Other Ambulatory Visit: Payer: Self-pay

## 2019-11-21 ENCOUNTER — Ambulatory Visit (INDEPENDENT_AMBULATORY_CARE_PROVIDER_SITE_OTHER): Payer: Medicaid Other | Admitting: Certified Nurse Midwife

## 2019-11-21 VITALS — BP 112/80 | HR 95 | Wt 171.6 lb

## 2019-11-21 DIAGNOSIS — Z3A31 31 weeks gestation of pregnancy: Secondary | ICD-10-CM | POA: Insufficient documentation

## 2019-11-21 DIAGNOSIS — R102 Pelvic and perineal pain: Secondary | ICD-10-CM | POA: Insufficient documentation

## 2019-11-21 DIAGNOSIS — Z3493 Encounter for supervision of normal pregnancy, unspecified, third trimester: Secondary | ICD-10-CM | POA: Insufficient documentation

## 2019-11-21 DIAGNOSIS — O26893 Other specified pregnancy related conditions, third trimester: Secondary | ICD-10-CM

## 2019-11-21 DIAGNOSIS — N898 Other specified noninflammatory disorders of vagina: Secondary | ICD-10-CM | POA: Insufficient documentation

## 2019-11-21 DIAGNOSIS — O26899 Other specified pregnancy related conditions, unspecified trimester: Secondary | ICD-10-CM

## 2019-11-21 LAB — POCT URINALYSIS DIPSTICK OB
Bilirubin, UA: NEGATIVE
Blood, UA: NEGATIVE
Glucose, UA: NEGATIVE
Ketones, UA: NEGATIVE
Leukocytes, UA: NEGATIVE
Nitrite, UA: NEGATIVE
POC,PROTEIN,UA: NEGATIVE
Spec Grav, UA: 1.015 (ref 1.010–1.025)
Urobilinogen, UA: 0.2 E.U./dL
pH, UA: 5 (ref 5.0–8.0)

## 2019-11-21 NOTE — Patient Instructions (Addendum)
Round Ligament Pain  The round ligament is a cord of muscle and tissue that helps support the uterus. It can become a source of pain during pregnancy if it becomes stretched or twisted as the baby grows. The pain usually begins in the second trimester (13-28 weeks) of pregnancy, and it can come and go until the baby is delivered. It is not a serious problem, and it does not cause harm to the baby. Round ligament pain is usually a short, sharp, and pinching pain, but it can also be a dull, lingering, and aching pain. The pain is felt in the lower side of the abdomen or in the groin. It usually starts deep in the groin and moves up to the outside of the hip area. The pain may occur when you:  Suddenly change position, such as quickly going from a sitting to standing position.  Roll over in bed.  Cough or sneeze.  Do physical activity. Follow these instructions at home:   Watch your condition for any changes.  When the pain starts, relax. Then try any of these methods to help with the pain: ? Sitting down. ? Flexing your knees up to your abdomen. ? Lying on your side with one pillow under your abdomen and another pillow between your legs. ? Sitting in a warm bath for 15-20 minutes or until the pain goes away.  Take over-the-counter and prescription medicines only as told by your health care provider.  Move slowly when you sit down or stand up.  Avoid long walks if they cause pain.  Stop or reduce your physical activities if they cause pain.  Keep all follow-up visits as told by your health care provider. This is important. Contact a health care provider if:  Your pain does not go away with treatment.  You feel pain in your back that you did not have before.  Your medicine is not helping. Get help right away if:  You have a fever or chills.  You develop uterine contractions.  You have vaginal bleeding.  You have nausea or vomiting.  You have diarrhea.  You have pain  when you urinate. Summary  Round ligament pain is felt in the lower abdomen or groin. It is usually a short, sharp, and pinching pain. It can also be a dull, lingering, and aching pain.  This pain usually begins in the second trimester (13-28 weeks). It occurs because the uterus is stretching with the growing baby, and it is not harmful to the baby.  You may notice the pain when you suddenly change position, when you cough or sneeze, or during physical activity.  Relaxing, flexing your knees to your abdomen, lying on one side, or taking a warm bath may help to get rid of the pain.  Get help from your health care provider if the pain does not go away or if you have vaginal bleeding, nausea, vomiting, diarrhea, or painful urination. This information is not intended to replace advice given to you by your health care provider. Make sure you discuss any questions you have with your health care provider. Document Revised: 01/03/2018 Document Reviewed: 01/03/2018 Elsevier Patient Education  2020 [employer]vier Inc[employer]    Fetal Movement Counts Patient Name: ________________________________________________ Patient Due Date: ____________________ What is a fetal movement count?  A fetal movement count is the number of times that you feel your baby move during a certain amount of time. This may also be called a fetal kick count. A fetal movement count is recommended  for every pregnant woman. You may be asked to start counting fetal movements as early as week 28 of your pregnancy. Pay attention to when your baby is most active. You may notice your baby's sleep and wake cycles. You may also notice things that make your baby move more. You should do a fetal movement count:  When your baby is normally most active.  At the same time each day. A good time to count movements is while you are resting, after having something to eat and drink. How do I count fetal movements? 1. Find a quiet, comfortable area. Sit,  or lie down on your side. 2. Write down the date, the start time and stop time, and the number of movements that you felt between those two times. Take this information with you to your health care visits. 3. Write down your start time when you feel the first movement. 4. Count kicks, flutters, swishes, rolls, and jabs. You should feel at least 10 movements. 5. You may stop counting after you have felt 10 movements, or if you have been counting for 2 hours. Write down the stop time. 6. If you do not feel 10 movements in 2 hours, contact your health care provider for further instructions. Your health care provider may want to do additional tests to assess your baby's well-being. Contact a health care provider if:  You feel fewer than 10 movements in 2 hours.  Your baby is not moving like he or she usually does. Date: ____________ Start time: ____________ Stop time: ____________ Movements: ____________ Date: ____________ Start time: ____________ Stop time: ____________ Movements: ____________ Date: ____________ Start time: ____________ Stop time: ____________ Movements: ____________ Date: ____________ Start time: ____________ Stop time: ____________ Movements: ____________ Date: ____________ Start time: ____________ Stop time: ____________ Movements: ____________ Date: ____________ Start time: ____________ Stop time: ____________ Movements: ____________ Date: ____________ Start time: ____________ Stop time: ____________ Movements: ____________ Date: ____________ Start time: ____________ Stop time: ____________ Movements: ____________ Date: ____________ Start time: ____________ Stop time: ____________ Movements: ____________ This information is not intended to replace advice given to you by your health care provider. Make sure you discuss any questions you have with your health care provider. Document Revised: 03/07/2019 Document Reviewed: 03/07/2019 Elsevier Patient Education  2020 [employer].   Third Trimester of Pregnancy  The third trimester is from week 28 through week 40 (months 7 through 9). This trimester is when your unborn baby (fetus) is growing very fast. At the end of the ninth month, the unborn baby is about 20 inches in length. It weighs about 6-10 pounds. Follow these instructions at home: Medicines  Take over-the-counter and prescription medicines only as told by your doctor. Some medicines are safe and some medicines are not safe during pregnancy.  Take a prenatal vitamin that contains at least 600 micrograms (mcg) of folic acid.  If you have trouble pooping (constipation), take medicine that will make your stool soft (stool softener) if your doctor approves. Eating and drinking   Eat regular, healthy meals.  Avoid raw meat and uncooked cheese.  If you get low calcium from the food you eat, talk to your doctor about taking a daily calcium supplement.  Eat four or five small meals rather than three large meals a day.  Avoid foods that are high in fat and sugars, such as fried and sweet foods.  To prevent constipation: ? Eat foods that are high in fiber, like fresh fruits and vegetables, whole grains, and beans. ? Drink  enough fluids to keep your pee (urine) clear or pale yellow. Activity  Exercise only as told by your doctor. Stop exercising if you start to have cramps.  Avoid heavy lifting, wear low heels, and sit up straight.  Do not exercise if it is too hot, too humid, or if you are in a place of great height (high altitude).  You may continue to have sex unless your doctor tells you not to. Relieving pain and discomfort  Wear a good support bra if your breasts are tender.  Take frequent breaks and rest with your legs raised if you have leg cramps or low back pain.  Take warm water baths (sitz baths) to soothe pain or discomfort caused by hemorrhoids. Use hemorrhoid cream if your doctor approves.  If you develop puffy, bulging veins  (varicose veins) in your legs: ? Wear support hose or compression stockings as told by your doctor. ? Raise (elevate) your feet for 15 minutes, 3-4 times a day. ? Limit salt in your food. Safety  Wear your seat belt when driving.  Make a list of emergency phone numbers, including numbers for family, friends, the hospital, and police and fire departments. Preparing for your baby's arrival To prepare for the arrival of your baby:  Take prenatal classes.  Practice driving to the hospital.  Visit the hospital and tour the maternity area.  Talk to your work about taking leave once the baby comes.  Pack your hospital bag.  Prepare the baby's room.  Go to your doctor visits.  Buy a rear-facing car seat. Learn how to install it in your car. General instructions  Do not use hot tubs, steam rooms, or saunas.  Do not use any products that contain nicotine or tobacco, such as cigarettes and e-cigarettes. If you need help quitting, ask your doctor.  Do not drink alcohol.  Do not douche or use tampons or scented sanitary pads.  Do not cross your legs for long periods of time.  Do not travel for long distances unless you must. Only do so if your doctor says it is okay.  Visit your dentist if you have not gone during your pregnancy. Use a soft toothbrush to brush your teeth. Be gentle when you floss.  Avoid cat litter boxes and soil used by cats. These carry germs that can cause birth defects in the baby and can cause a loss of your baby (miscarriage) or stillbirth.  Keep all your prenatal visits as told by your doctor. This is important. Contact a doctor if:  You are not sure if you are in labor or if your water has broken.  You are dizzy.  You have mild cramps or pressure in your lower belly.  You have a nagging pain in your belly area.  You continue to feel sick to your stomach, you throw up, or you have watery poop.  You have bad smelling fluid coming from your  vagina.  You have pain when you pee. Get help right away if:  You have a fever.  You are leaking fluid from your vagina.  You are spotting or bleeding from your vagina.  You have severe belly cramps or pain.  You lose or gain weight quickly.  You have trouble catching your breath and have chest pain.  You notice sudden or extreme puffiness (swelling) of your face, hands, ankles, feet, or legs.  You have not felt the baby move in over an hour.  You have severe headaches that do not  go away with medicine.  You have trouble seeing.  You are leaking, or you are having a gush of fluid, from your vagina before you are 37 weeks.  You have regular belly spasms (contractions) before you are 37 weeks. Summary  The third trimester is from week 28 through week 40 (months 7 through 9). This time is when your unborn baby is growing very fast.  Follow your doctor's advice about medicine, food, and activity.  Get ready for the arrival of your baby by taking prenatal classes, getting all the baby items ready, preparing the baby's room, and visiting your doctor to be checked.  Get help right away if you are bleeding from your vagina, or you have chest pain and trouble catching your breath, or if you have not felt your baby move in over an hour. This information is not intended to replace advice given to you by your health care provider. Make sure you discuss any questions you have with your health care provider. Document Revised: 11/08/2018 Document Reviewed: 08/23/2016 Elsevier Patient Education  [place].

## 2019-11-22 ENCOUNTER — Telehealth: Payer: Self-pay

## 2019-11-22 NOTE — Progress Notes (Signed)
ROB-Reports increased pelvic pressure and vaginal discharge. Unable to attend pelvic floor physical therapy due to lack of childcare. Vaginal swab collected, see orders. Cervix visually closed on speculum exam. Discussed home treatment measures including use of abdominal support. Anticipatory guidance regarding course of prenatal care. Reviewed red flag symptoms and when to call. RTC x 2 weeks for ROB or sooner if needed.

## 2019-11-22 NOTE — Telephone Encounter (Signed)
Patient had an appointment yesterday 11/21/19 with Serafina Royals CNM. An Aptima culture was obtained. This write mislabeled the specimen. Spoke with Corrie Dandy at [facility] who called to ask for a requisition for the name of the patient whose name was on the specimen. The error was then discovered. Requested Mary discard that specimen. A call was placed to Tamara Bishop. An apology was given several times and she agreed to come to the office for a self swab on Monday 11/25/19. Patient was very understanding. Will followup on 11/25/19. Serafina Royals CNM notified.

## 2019-11-25 ENCOUNTER — Telehealth: Payer: Self-pay

## 2019-11-25 LAB — CERVICOVAGINAL ANCILLARY ONLY
Bacterial Vaginitis (gardnerella): NEGATIVE
Candida Glabrata: NEGATIVE
Candida Vaginitis: NEGATIVE
Chlamydia: NEGATIVE
Comment: NEGATIVE
Comment: NEGATIVE
Comment: NEGATIVE
Comment: NEGATIVE
Comment: NEGATIVE
Comment: NORMAL
Neisseria Gonorrhea: NEGATIVE
Trichomonas: NEGATIVE

## 2019-11-25 NOTE — Telephone Encounter (Signed)
Patient came to office to do a self swab for vaginal discharge. Patient was here on 11/21/19 for a prenatal visit with Serafina Royals CNM. An Aptima was obtained and since resulted. Please see documentation dated 11/22/19. The specimen for Tamara Bishop was NOT mislabeled as previously thought.

## 2019-11-27 ENCOUNTER — Telehealth: Payer: Self-pay

## 2019-11-27 NOTE — Telephone Encounter (Signed)
error 

## 2019-12-04 ENCOUNTER — Other Ambulatory Visit: Payer: Self-pay

## 2019-12-04 ENCOUNTER — Ambulatory Visit (INDEPENDENT_AMBULATORY_CARE_PROVIDER_SITE_OTHER): Payer: Medicaid Other | Admitting: Certified Nurse Midwife

## 2019-12-04 ENCOUNTER — Encounter: Payer: Self-pay | Admitting: Certified Nurse Midwife

## 2019-12-04 VITALS — BP 136/84 | HR 105

## 2019-12-04 DIAGNOSIS — Z3A33 33 weeks gestation of pregnancy: Secondary | ICD-10-CM | POA: Diagnosis not present

## 2019-12-04 LAB — POCT URINALYSIS DIPSTICK OB
Bilirubin, UA: NEGATIVE
Blood, UA: NEGATIVE
Glucose, UA: NEGATIVE
Ketones, UA: NEGATIVE
Leukocytes, UA: NEGATIVE
Nitrite, UA: NEGATIVE
POC,PROTEIN,UA: NEGATIVE
Spec Grav, UA: 1.015 (ref 1.010–1.025)
Urobilinogen, UA: 0.2 E.U./dL
pH, UA: 5 (ref 5.0–8.0)

## 2019-12-04 NOTE — Progress Notes (Signed)
ROB doing well. Feels good fetal movement. Discussed GBS testing and cultures next visit. She verbalizes and agrees to plan. Follow up 2 wk with Marcelino Duster.   [name], CNM

## 2019-12-04 NOTE — Patient Instructions (Signed)
Group B Streptococcus Infection During Pregnancy °Group B Streptococcus (GBS) is a type of bacteria that is often found in healthy people. It is commonly found in the rectum, vagina, and intestines. In people who are healthy and not pregnant, the bacteria rarely cause serious illness or complications. However, women who test positive for GBS during pregnancy can pass the bacteria to the baby during childbirth. This can cause serious infection in the baby after birth. °Women with GBS may also have infections during their pregnancy or soon after childbirth. The infections include urinary tract infections (UTIs) or infections of the uterus. GBS also increases a woman's risk of complications during pregnancy, such as early labor or delivery, miscarriage, or stillbirth. Routine testing for GBS is recommended for all pregnant women. °What are the causes? °This condition is caused by bacteria called Streptococcus agalactiae. °What increases the risk? °You may have a higher risk for GBS infection during pregnancy if you had one during a past pregnancy. °What are the signs or symptoms? °In most cases, GBS infection does not cause symptoms in pregnant women. If symptoms exist, they may include: °· Labor that starts before the 37th week of pregnancy. °· A UTI or bladder infection. This may cause a fever, frequent urination, or pain and burning during urination. °· Fever during labor. There can also be a rapid heartbeat in the mother or baby. °Rare but serious symptoms of a GBS infection in women include: °· Blood infection (septicemia). This may cause fever, chills, or confusion. °· Lung infection (pneumonia). This may cause fever, chills, cough, rapid breathing, chest pain, or difficulty breathing. °· Bone, joint, skin, or soft tissue infection. °How is this diagnosed? °You may be screened for GBS between week 35 and week 37 of pregnancy. If you have symptoms of preterm labor, you may be screened earlier. This condition is  diagnosed based on lab test results from: °· A swab of fluid from the vagina and rectum. °· A urine sample. °How is this treated? °This condition is treated with antibiotic medicine. Antibiotic medicine may be given: °· To you when you go into labor, or as soon as your water breaks. The medicines will continue until after you give birth. If you are having a cesarean delivery, you do not need antibiotics unless your water has broken. °· To your baby, if he or she requires treatment. Your health care provider will check your baby to decide if he or she needs antibiotics to prevent a serious infection. °Follow these instructions at home: °· Take over-the-counter and prescription medicines only as told by your health care provider. °· Take your antibiotic medicine as told by your health care provider. Do not stop taking the antibiotic even if you start to feel better. °· Keep all pre-birth (prenatal) visits and follow-up visits as told by your health care provider. This is important. °Contact a health care provider if: °· You have pain or burning when you urinate. °· You have to urinate more often than usual. °· You have a fever or chills. °· You develop a bad-smelling vaginal discharge. °Get help right away if: °· Your water breaks. °· You go into labor. °· You have severe pain in your abdomen. °· You have difficulty breathing. °· You have chest pain. °These symptoms may represent a serious problem that is an emergency. Do not wait to see if the symptoms will go away. Get medical help right away. Call your local emergency services (911 in the [place] Do not drive yourself to   the hospital. °Summary °· GBS is a type of bacteria that is common in healthy people. °· During pregnancy, colonization with GBS can cause serious complications for you or your baby. °· Your health care provider will screen you between 35 and 37 weeks of pregnancy to determine if you are colonized with GBS. °· If you are colonized with GBS during  pregnancy, your health care provider will recommend antibiotics through an IV during labor. °· After delivery, your baby will be evaluated for complications related to potential GBS infection and may require antibiotics to prevent a serious infection. °This information is not intended to replace advice given to you by your health care provider. Make sure you discuss any questions you have with your health care provider. °Document Revised: 02/11/2019 Document Reviewed: 02/11/2019 °Elsevier Patient Education © 2020 Elsevier Inc. ° °

## 2019-12-10 ENCOUNTER — Telehealth: Payer: Self-pay | Admitting: Cardiology

## 2019-12-10 NOTE — Telephone Encounter (Signed)
Patient declined echo and ov    She feels problem is situational and she is almost due for delivery.

## 2019-12-13 ENCOUNTER — Other Ambulatory Visit: Payer: Medicaid Other

## 2019-12-16 ENCOUNTER — Ambulatory Visit: Payer: Medicaid Other | Admitting: Cardiology

## 2019-12-18 ENCOUNTER — Other Ambulatory Visit: Payer: Self-pay

## 2019-12-18 ENCOUNTER — Observation Stay
Admission: EM | Admit: 2019-12-18 | Discharge: 2019-12-19 | Disposition: A | Discharge status: Home / Self Care | Payer: Medicaid Other | Attending: Certified Nurse Midwife | Admitting: Certified Nurse Midwife

## 2019-12-18 ENCOUNTER — Encounter: Payer: Self-pay | Admitting: Obstetrics and Gynecology

## 2019-12-18 DIAGNOSIS — Z8759 Personal history of other complications of pregnancy, childbirth and the puerperium: Secondary | ICD-10-CM | POA: Insufficient documentation

## 2019-12-18 DIAGNOSIS — O26892 Other specified pregnancy related conditions, second trimester: Secondary | ICD-10-CM | POA: Diagnosis present

## 2019-12-18 DIAGNOSIS — R102 Pelvic and perineal pain: Secondary | ICD-10-CM | POA: Insufficient documentation

## 2019-12-18 DIAGNOSIS — O99891 Other specified diseases and conditions complicating pregnancy: Principal | ICD-10-CM | POA: Insufficient documentation

## 2019-12-18 DIAGNOSIS — Z3A35 35 weeks gestation of pregnancy: Secondary | ICD-10-CM | POA: Insufficient documentation

## 2019-12-18 NOTE — OB Triage Note (Signed)
Pt presents to L&D with c/o contractions, and pressure since yesterday morning with nausea, and sharp pains in back and vagina. Reports good fetal movement, Denies vaginal bleeding or leaking of fluid. Plan to monitor fetal and maternal well being and assess for labor.

## 2019-12-19 ENCOUNTER — Ambulatory Visit (INDEPENDENT_AMBULATORY_CARE_PROVIDER_SITE_OTHER): Payer: Medicaid Other | Admitting: Certified Nurse Midwife

## 2019-12-19 VITALS — BP 122/87 | HR 93 | Wt 179.2 lb

## 2019-12-19 DIAGNOSIS — Z3685 Encounter for antenatal screening for Streptococcus B: Secondary | ICD-10-CM | POA: Diagnosis not present

## 2019-12-19 DIAGNOSIS — Z3A35 35 weeks gestation of pregnancy: Secondary | ICD-10-CM

## 2019-12-19 DIAGNOSIS — O26899 Other specified pregnancy related conditions, unspecified trimester: Secondary | ICD-10-CM

## 2019-12-19 DIAGNOSIS — Z8759 Personal history of other complications of pregnancy, childbirth and the puerperium: Secondary | ICD-10-CM | POA: Diagnosis not present

## 2019-12-19 DIAGNOSIS — Z3493 Encounter for supervision of normal pregnancy, unspecified, third trimester: Secondary | ICD-10-CM | POA: Diagnosis not present

## 2019-12-19 DIAGNOSIS — O26892 Other specified pregnancy related conditions, second trimester: Secondary | ICD-10-CM | POA: Diagnosis not present

## 2019-12-19 DIAGNOSIS — Z113 Encounter for screening for infections with a predominantly sexual mode of transmission: Secondary | ICD-10-CM | POA: Diagnosis not present

## 2019-12-19 DIAGNOSIS — O99891 Other specified diseases and conditions complicating pregnancy: Secondary | ICD-10-CM | POA: Diagnosis not present

## 2019-12-19 DIAGNOSIS — R102 Pelvic and perineal pain: Secondary | ICD-10-CM

## 2019-12-19 LAB — URINALYSIS, ROUTINE W REFLEX MICROSCOPIC
Bilirubin Urine: NEGATIVE
Glucose, UA: NEGATIVE mg/dL
Hgb urine dipstick: NEGATIVE
Ketones, ur: NEGATIVE mg/dL
Nitrite: NEGATIVE
Protein, ur: NEGATIVE mg/dL
Specific Gravity, Urine: 1.01 (ref 1.005–1.030)
pH: 7 (ref 5.0–8.0)

## 2019-12-19 LAB — POCT URINALYSIS DIPSTICK OB
Bilirubin, UA: NEGATIVE
Blood, UA: NEGATIVE
Glucose, UA: NEGATIVE
Ketones, UA: NEGATIVE
Leukocytes, UA: NEGATIVE
Nitrite, UA: NEGATIVE
POC,PROTEIN,UA: NEGATIVE
Spec Grav, UA: 1.015 (ref 1.010–1.025)
Urobilinogen, UA: 0.2 E.U./dL
pH, UA: 5 (ref 5.0–8.0)

## 2019-12-19 LAB — FETAL FIBRONECTIN: Fetal Fibronectin: NEGATIVE

## 2019-12-19 MED ORDER — CYCLOBENZAPRINE HCL 10 MG PO TABS
10.0000 mg | ORAL_TABLET | Freq: Three times a day (TID) | ORAL | 0 refills | Status: DC | PRN
Start: 2019-12-19 — End: 2020-01-10

## 2019-12-19 NOTE — Patient Instructions (Signed)
Fetal Movement Counts Patient Name: ________________________________________________ Patient Due Date: ____________________ What is a fetal movement count?  A fetal movement count is the number of times that you feel your baby move during a certain amount of time. This may also be called a fetal kick count. A fetal movement count is recommended for every pregnant woman. You may be asked to start counting fetal movements as early as week 28 of your pregnancy. Pay attention to when your baby is most active. You may notice your baby's sleep and wake cycles. You may also notice things that make your baby move more. You should do a fetal movement count:  When your baby is normally most active.  At the same time each day. A good time to count movements is while you are resting, after having something to eat and drink. How do I count fetal movements? 1. Find a quiet, comfortable area. Sit, or lie down on your side. 2. Write down the date, the start time and stop time, and the number of movements that you felt between those two times. Take this information with you to your health care visits. 3. Write down your start time when you feel the first movement. 4. Count kicks, flutters, swishes, rolls, and jabs. You should feel at least 10 movements. 5. You may stop counting after you have felt 10 movements, or if you have been counting for 2 hours. Write down the stop time. 6. If you do not feel 10 movements in 2 hours, contact your health care provider for further instructions. Your health care provider may want to do additional tests to assess your baby's well-being. Contact a health care provider if:  You feel fewer than 10 movements in 2 hours.  Your baby is not moving like he or she usually does. Date: ____________ Start time: ____________ Stop time: ____________ Movements: ____________ Date: ____________ Start time: ____________ Stop time: ____________ Movements: ____________ Date: ____________  Start time: ____________ Stop time: ____________ Movements: ____________ Date: ____________ Start time: ____________ Stop time: ____________ Movements: ____________ Date: ____________ Start time: ____________ Stop time: ____________ Movements: ____________ Date: ____________ Start time: ____________ Stop time: ____________ Movements: ____________ Date: ____________ Start time: ____________ Stop time: ____________ Movements: ____________ Date: ____________ Start time: ____________ Stop time: ____________ Movements: ____________ Date: ____________ Start time: ____________ Stop time: ____________ Movements: ____________ This information is not intended to replace advice given to you by your health care provider. Make sure you discuss any questions you have with your health care provider. Document Revised: 03/07/2019 Document Reviewed: 03/07/2019 Elsevier Patient Education  2020 Elsevier Inc.   Back Pain in Pregnancy Back pain during pregnancy is common. Back pain may be caused by several factors that are related to changes during your pregnancy. Follow these instructions at home: Managing pain, stiffness, and swelling      If directed, for sudden (acute) back pain, put ice on the painful area. ? Put ice in a plastic bag. ? Place a towel between your skin and the bag. ? Leave the ice on for 20 minutes, 2-3 times per day.  If directed, apply heat to the affected area before you exercise. Use the heat source that your health care provider recommends, such as a moist heat pack or a heating pad. ? Place a towel between your skin and the heat source. ? Leave the heat on for 20-30 minutes. ? Remove the heat if your skin turns bright red. This is especially important if you are unable to feel pain, heat,  or cold. You may have a greater risk of getting burned.  If directed, massage the affected area. Activity  Exercise as told by your health care provider. Gentle exercise is the best way to  prevent or manage back pain.  Listen to your body when lifting. If lifting hurts, ask for help or bend your knees. This uses your leg muscles instead of your back muscles.  Squat down when picking up something from the floor. Do not bend over.  Only use bed rest for short periods as told by your health care provider. Bed rest should only be used for the most severe episodes of back pain. Standing, sitting, and lying down  Do not stand in one place for long periods of time.  Use good posture when sitting. Make sure your head rests over your shoulders and is not hanging forward. Use a pillow on your lower back if necessary.  Try sleeping on your side, preferably the left side, with a pregnancy support pillow or 1-2 regular pillows between your legs. ? If you have back pain after a night's rest, your bed may be too soft. ? A firm mattress may provide more support for your back during pregnancy. General instructions  Do not wear high heels.  Eat a healthy diet. Try to gain weight within your health care provider's recommendations.  Use a maternity girdle, elastic sling, or back brace as told by your health care provider.  Take over-the-counter and prescription medicines only as told by your health care provider.  Work with a physical therapist or massage therapist to find ways to manage back pain. Acupuncture or massage therapy may be helpful.  Keep all follow-up visits as told by your health care provider. This is important. Contact a health care provider if:  Your back pain interferes with your daily activities.  You have increasing pain in other parts of your body. Get help right away if:  You develop numbness, tingling, weakness, or problems with the use of your arms or legs.  You develop severe back pain that is not controlled with medicine.  You have a change in bowel or bladder control.  You develop shortness of breath, dizziness, or you faint.  You develop nausea,  vomiting, or sweating.  You have back pain that is a rhythmic, cramping pain similar to labor pains. Labor pain is usually 1-2 minutes apart, lasts for about 1 minute, and involves a bearing down feeling or pressure in your pelvis.  You have back pain and your water breaks or you have vaginal bleeding.  You have back pain or numbness that travels down your leg.  Your back pain developed after you fell.  You develop pain on one side of your back.  You see blood in your urine.  You develop skin blisters in the area of your back pain. Summary  Back pain may be caused by several factors that are related to changes during your pregnancy.  Follow instructions as told by your health care provider for managing pain, stiffness, and swelling.  Exercise as told by your health care provider. Gentle exercise is the best way to prevent or manage back pain.  Take over-the-counter and prescription medicines only as told by your health care provider.  Keep all follow-up visits as told by your health care provider. This is important. This information is not intended to replace advice given to you by your health care provider. Make sure you discuss any questions you have with your health care provider.  Document Revised: 11/06/2018 Document Reviewed: 01/03/2018 Elsevier Patient Education  2020 [employer].

## 2019-12-19 NOTE — Addendum Note (Signed)
Addended by: Brooke Dare on: 12/19/2019 12:04 PM   Modules accepted: Orders

## 2019-12-19 NOTE — Discharge Instructions (Signed)
Call provider or return to birthplace with: ? ?1. Regular contractions ?2. Leaking of fluid from your vagina ?3. Vaginal bleeding: Bright red or heavy like a period ?4. Decreased Fetal movement  ?

## 2019-12-19 NOTE — Progress Notes (Signed)
ROB-Seen in OB Triage last night, see chart. Reports increased pelvic pressure and back pain. Discussed home treatment measures. Rx Flexeril, see orders. 36 week cultures collected, see orders. Anticipatory guidance regarding course of prenatal care. Reviewed red flag symptoms and when to call. RTC x 1 week for ROB or sooner if needed.

## 2019-12-19 NOTE — Discharge Summary (Signed)
Obstetric Discharge Summary  Patient ID: Tamara Bishop MRN: [id number] DOB/AGE: 01/11/1996 23 y.o.   Date of Admission: 12/18/2019  Date of Discharge: 12/19/2019  Admitting Diagnosis: Observation at [redacted]w[redacted]d  Secondary Diagnosis: History of pre-eclampsia, Pelvic pain in pregnancy,      Discharge Diagnosis: No other diagnosis   Antepartum Procedures: NST, IM Vistaril   Brief Hospital Course   L&D OB Triage Note  Tamara Bishop is a 24 y.o. G46P1001 female at [redacted]w[redacted]d, EDD Estimated Date of Delivery: 01/18/20 who presented to triage for complaints of contractions, pelvic pressure, and occasional nausea.  She was evaluated by the nurses with no significant findings for fetal distress or preterm labor. Vital signs stable. An NST was performed and has been reviewed by CNM. She was treated with IM Vistaril.   NST INTERPRETATION:  Indications: rule out uterine contractions  Mode: External Baseline Rate (A): 125 bpm Variability: Moderate Accelerations: 15 x 15 Decelerations: None Contraction Frequency (min): none  Impression: reactive  Dilation: Closed Effacement (%): 50 Cervical Position: Posterior Exam by:: s. Apel, RNC   Plan: NST performed was reviewed and was found to be reactive. She was discharged home with bleeding/labor precautions.  Continue routine prenatal care. Follow up with CNM as previously scheduled.    Discharge Instructions: Per After Visit Summary.  Activity: Also refer to After Visit Summary.   Diet: Regular  Medications: Allergies as of 12/19/2019      Reactions   Azithromycin Other (See Comments)   Felt like her stomach was going to come out of her body      Medication List    TAKE these medications   aspirin EC 81 MG tablet Take 1 tablet (81 mg total) by mouth daily. Take after 12 weeks for prevention of preeclampssia later in pregnancy   Fusion Plus Caps Take 1 tablet by mouth daily.   multivitamin-prenatal 27-0.8 MG Tabs  tablet Take 1 tablet by mouth daily at 12 noon.      Outpatient follow up: As previously scheduled   Postpartum contraception: Nexplanon  Discharged Condition: stable  Discharged to: home   Gunnar Bulla, CNM [facility], [facility] 12/19/19 4:38 AM

## 2019-12-19 NOTE — Addendum Note (Signed)
Addended by: Brooke Dare on: 12/19/2019 10:59 AM   Modules accepted: Orders

## 2019-12-20 ENCOUNTER — Encounter: Payer: Medicaid Other | Admitting: Certified Nurse Midwife

## 2019-12-21 LAB — STREP GP B NAA: Strep Gp B NAA: NEGATIVE

## 2019-12-22 LAB — GC/CHLAMYDIA PROBE AMP
Chlamydia trachomatis, NAA: NEGATIVE
Neisseria Gonorrhoeae by PCR: NEGATIVE

## 2019-12-25 ENCOUNTER — Encounter: Payer: Self-pay | Admitting: Certified Nurse Midwife

## 2019-12-25 ENCOUNTER — Other Ambulatory Visit: Payer: Self-pay

## 2019-12-25 ENCOUNTER — Ambulatory Visit (INDEPENDENT_AMBULATORY_CARE_PROVIDER_SITE_OTHER): Payer: Medicaid Other | Admitting: Certified Nurse Midwife

## 2019-12-25 VITALS — BP 132/85 | HR 122 | Wt 181.0 lb

## 2019-12-25 DIAGNOSIS — Z3A36 36 weeks gestation of pregnancy: Secondary | ICD-10-CM | POA: Diagnosis not present

## 2019-12-25 LAB — POCT URINALYSIS DIPSTICK OB
Bilirubin, UA: NEGATIVE
Blood, UA: NEGATIVE
Glucose, UA: NEGATIVE
Ketones, UA: NEGATIVE
Leukocytes, UA: NEGATIVE
Nitrite, UA: NEGATIVE
POC,PROTEIN,UA: NEGATIVE
Spec Grav, UA: 1.01 (ref 1.010–1.025)
Urobilinogen, UA: 0.2 E.U./dL
pH, UA: 5 (ref 5.0–8.0)

## 2019-12-25 NOTE — Progress Notes (Signed)
ROB doing well. Feels good fetal movement. GBS completed last visit. Results reviewed. Labor precautions discussed. SVE per pt request.1-2/60/-3.  Follow up 1 wk with Marcelino Duster.   [name], CNM

## 2019-12-25 NOTE — Patient Instructions (Signed)
Group B Streptococcus Infection During Pregnancy °Group B Streptococcus (GBS) is a type of bacteria that is often found in healthy people. It is commonly found in the rectum, vagina, and intestines. In people who are healthy and not pregnant, the bacteria rarely cause serious illness or complications. However, women who test positive for GBS during pregnancy can pass the bacteria to the baby during childbirth. This can cause serious infection in the baby after birth. °Women with GBS may also have infections during their pregnancy or soon after childbirth. The infections include urinary tract infections (UTIs) or infections of the uterus. GBS also increases a woman's risk of complications during pregnancy, such as early labor or delivery, miscarriage, or stillbirth. Routine testing for GBS is recommended for all pregnant women. °What are the causes? °This condition is caused by bacteria called Streptococcus agalactiae. °What increases the risk? °You may have a higher risk for GBS infection during pregnancy if you had one during a past pregnancy. °What are the signs or symptoms? °In most cases, GBS infection does not cause symptoms in pregnant women. If symptoms exist, they may include: °· Labor that starts before the 37th week of pregnancy. °· A UTI or bladder infection. This may cause a fever, frequent urination, or pain and burning during urination. °· Fever during labor. There can also be a rapid heartbeat in the mother or baby. °Rare but serious symptoms of a GBS infection in women include: °· Blood infection (septicemia). This may cause fever, chills, or confusion. °· Lung infection (pneumonia). This may cause fever, chills, cough, rapid breathing, chest pain, or difficulty breathing. °· Bone, joint, skin, or soft tissue infection. °How is this diagnosed? °You may be screened for GBS between week 35 and week 37 of pregnancy. If you have symptoms of preterm labor, you may be screened earlier. This condition is  diagnosed based on lab test results from: °· A swab of fluid from the vagina and rectum. °· A urine sample. °How is this treated? °This condition is treated with antibiotic medicine. Antibiotic medicine may be given: °· To you when you go into labor, or as soon as your water breaks. The medicines will continue until after you give birth. If you are having a cesarean delivery, you do not need antibiotics unless your water has broken. °· To your baby, if he or she requires treatment. Your health care provider will check your baby to decide if he or she needs antibiotics to prevent a serious infection. °Follow these instructions at home: °· Take over-the-counter and prescription medicines only as told by your health care provider. °· Take your antibiotic medicine as told by your health care provider. Do not stop taking the antibiotic even if you start to feel better. °· Keep all pre-birth (prenatal) visits and follow-up visits as told by your health care provider. This is important. °Contact a health care provider if: °· You have pain or burning when you urinate. °· You have to urinate more often than usual. °· You have a fever or chills. °· You develop a bad-smelling vaginal discharge. °Get help right away if: °· Your water breaks. °· You go into labor. °· You have severe pain in your abdomen. °· You have difficulty breathing. °· You have chest pain. °These symptoms may represent a serious problem that is an emergency. Do not wait to see if the symptoms will go away. Get medical help right away. Call your local emergency services (911 in the [place] Do not drive yourself to   the hospital. °Summary °· GBS is a type of bacteria that is common in healthy people. °· During pregnancy, colonization with GBS can cause serious complications for you or your baby. °· Your health care provider will screen you between 35 and 37 weeks of pregnancy to determine if you are colonized with GBS. °· If you are colonized with GBS during  pregnancy, your health care provider will recommend antibiotics through an IV during labor. °· After delivery, your baby will be evaluated for complications related to potential GBS infection and may require antibiotics to prevent a serious infection. °This information is not intended to replace advice given to you by your health care provider. Make sure you discuss any questions you have with your health care provider. °Document Revised: 02/11/2019 Document Reviewed: 02/11/2019 °Elsevier Patient Education © 2020 Elsevier Inc. ° °

## 2019-12-27 ENCOUNTER — Ambulatory Visit (INDEPENDENT_AMBULATORY_CARE_PROVIDER_SITE_OTHER): Payer: Medicaid Other | Admitting: Certified Nurse Midwife

## 2019-12-27 ENCOUNTER — Other Ambulatory Visit: Payer: Self-pay

## 2019-12-27 VITALS — BP 137/79 | HR 107 | Wt 183.8 lb

## 2019-12-27 DIAGNOSIS — O368130 Decreased fetal movements, third trimester, not applicable or unspecified: Secondary | ICD-10-CM

## 2019-12-27 DIAGNOSIS — Z3A36 36 weeks gestation of pregnancy: Secondary | ICD-10-CM

## 2019-12-27 DIAGNOSIS — Z3493 Encounter for supervision of normal pregnancy, unspecified, third trimester: Secondary | ICD-10-CM

## 2019-12-27 DIAGNOSIS — O26899 Other specified pregnancy related conditions, unspecified trimester: Secondary | ICD-10-CM | POA: Diagnosis not present

## 2019-12-27 DIAGNOSIS — R102 Pelvic and perineal pain: Secondary | ICD-10-CM

## 2019-12-27 LAB — POCT URINALYSIS DIPSTICK OB
Bilirubin, UA: NEGATIVE
Blood, UA: NEGATIVE
Glucose, UA: NEGATIVE
Ketones, UA: NEGATIVE
Nitrite, UA: NEGATIVE
POC,PROTEIN,UA: NEGATIVE
Spec Grav, UA: <=1.005 — AB (ref 1.010–1.025)
Urobilinogen, UA: 0.2 E.U./dL
pH, UA: 7.5 (ref 5.0–8.0)

## 2019-12-27 NOTE — Progress Notes (Signed)
ROB-Pt present due to decreased fetal movement. Pt had NST.

## 2019-12-27 NOTE — Patient Instructions (Signed)
Fetal Movement Counts Patient Name: ________________________________________________ Patient Due Date: ____________________ What is a fetal movement count?  A fetal movement count is the number of times that you feel your baby move during a certain amount of time. This may also be called a fetal kick count. A fetal movement count is recommended for every pregnant woman. You may be asked to start counting fetal movements as early as week 28 of your pregnancy. Pay attention to when your baby is most active. You may notice your baby's sleep and wake cycles. You may also notice things that make your baby move more. You should do a fetal movement count:  When your baby is normally most active.  At the same time each day. A good time to count movements is while you are resting, after having something to eat and drink. How do I count fetal movements? 1. Find a quiet, comfortable area. Sit, or lie down on your side. 2. Write down the date, the start time and stop time, and the number of movements that you felt between those two times. Take this information with you to your health care visits. 3. Write down your start time when you feel the first movement. 4. Count kicks, flutters, swishes, rolls, and jabs. You should feel at least 10 movements. 5. You may stop counting after you have felt 10 movements, or if you have been counting for 2 hours. Write down the stop time. 6. If you do not feel 10 movements in 2 hours, contact your health care provider for further instructions. Your health care provider may want to do additional tests to assess your baby's well-being. Contact a health care provider if:  You feel fewer than 10 movements in 2 hours.  Your baby is not moving like he or she usually does. Date: ____________ Start time: ____________ Stop time: ____________ Movements: ____________ Date: ____________ Start time: ____________ Stop time: ____________ Movements: ____________ Date: ____________  Start time: ____________ Stop time: ____________ Movements: ____________ Date: ____________ Start time: ____________ Stop time: ____________ Movements: ____________ Date: ____________ Start time: ____________ Stop time: ____________ Movements: ____________ Date: ____________ Start time: ____________ Stop time: ____________ Movements: ____________ Date: ____________ Start time: ____________ Stop time: ____________ Movements: ____________ Date: ____________ Start time: ____________ Stop time: ____________ Movements: ____________ Date: ____________ Start time: ____________ Stop time: ____________ Movements: ____________ This information is not intended to replace advice given to you by your health care provider. Make sure you discuss any questions you have with your health care provider. Document Revised: 03/07/2019 Document Reviewed: 03/07/2019 Else[employer] Patient Education  2020 [employer]  

## 2019-12-27 NOTE — Progress Notes (Signed)
Subjective:   Tamara Bishop is a 24 y.o. G2P1001 [redacted]w[redacted]d being seen today for work in problem visit.  Patient reports decreased fetal movement for the last 24 hours.    No vaginal bleeding or leaking of fluid. Denies difficulty breathing or respiratory distress, chest pain, dysuria, and leg pain or swelling.   The following portions of the patient's history were reviewed and updated as appropriate: allergies, current medications, past family history, past medical history, past social history, past surgical history and problem list.   Objective:   BP 137/79   Pulse (!) 107   Wt 183 lb 12.8 oz (83.4 kg)   LMP 04/14/2019 (Approximate)   BMI 34.17 kg/m   FHT: Fetal Heart Rate (bpm): 145  Fetal Movement: Movement: Present  Presentation: Presentation: Vertex    Abdomen:  Soft, gravid, appropriate for gestational age,non-tender  Cervix: No change since previous exam   Results for orders placed or performed in visit on 12/27/19 (from the past 24 hour(s))  POC Urinalysis Dipstick OB     Status: Abnormal   Collection Time: 12/27/19  3:55 PM  Result Value Ref Range   Color, UA yellow    Clarity, UA clear    Glucose, UA Negative Negative   Bilirubin, UA neg    Ketones, UA neg    Spec Grav, UA <=1.005 (A) 1.010 - 1.025   Blood, UA neg    pH, UA 7.5 5.0 - 8.0   POC,PROTEIN,UA Negative Negative, Trace, Small (1+), Moderate (2+), Large (3+), 4+   Urobilinogen, UA 0.2 0.2 or 1.0 E.U./dL   Nitrite, UA neg    Leukocytes, UA Moderate (2+) (A) Negative   Appearance yellow;clear    Odor      Assessment:   Pregnancy:  G2P1001 at [redacted]w[redacted]d  1. Third trimester pregnancy   2. Decreased fetal movements in third trimester, single or unspecified fetus  - POC Urinalysis Dipstick OB  3. [redacted] weeks gestation of pregnancy   4. Pelvic pressure in pregnancy     Plan:   NST performed today was reviewed and was found to be reactive. Baseline 140 bpm with moderate variability, accelerations  present and no decelerations noted.   Continue recommended prenatal care.  Preterm labor symptoms: vaginal bleeding, contractions and leaking of fluid reviewed in detail.  Fetal movement precautions reviewed.  Follow up as previously scheduled or sooner if needed.   Gunnar Bulla, CNM [facility], [facility] 12/27/19 4:44 PM

## 2019-12-27 NOTE — Telephone Encounter (Signed)
Please call patient. Advise either office or hospital. Thanks, JML

## 2020-01-03 ENCOUNTER — Other Ambulatory Visit: Payer: Self-pay

## 2020-01-03 ENCOUNTER — Ambulatory Visit (INDEPENDENT_AMBULATORY_CARE_PROVIDER_SITE_OTHER): Payer: Medicaid Other | Admitting: Certified Nurse Midwife

## 2020-01-03 VITALS — BP 117/89 | HR 119 | Wt 185.4 lb

## 2020-01-03 DIAGNOSIS — Z3A37 37 weeks gestation of pregnancy: Secondary | ICD-10-CM

## 2020-01-03 DIAGNOSIS — Z3483 Encounter for supervision of other normal pregnancy, third trimester: Secondary | ICD-10-CM

## 2020-01-03 DIAGNOSIS — Z8759 Personal history of other complications of pregnancy, childbirth and the puerperium: Secondary | ICD-10-CM

## 2020-01-03 LAB — POCT URINALYSIS DIPSTICK OB
Bilirubin, UA: NEGATIVE
Blood, UA: NEGATIVE
Glucose, UA: NEGATIVE
Ketones, UA: NEGATIVE
Leukocytes, UA: NEGATIVE
Nitrite, UA: NEGATIVE
POC,PROTEIN,UA: NEGATIVE
Spec Grav, UA: 1.01 (ref 1.010–1.025)
Urobilinogen, UA: 0.2 E.U./dL
pH, UA: 6 (ref 5.0–8.0)

## 2020-01-03 NOTE — Progress Notes (Signed)
ROB- Doing well. Plans to breast feed. Started herbal prep. Using Red Raspberry Leaf tea. Started evening primrose oil, but stopped due to irritation and "noticed a spot". Cervical check and membrane sweep by Serafina Royals CNM per patient request. SVE unchanged from previous exam. Discussed miles circuit and curb walking. Reviewed results of thirty-six (36) week cultures.  Patient would like to discuss a 39 week induction due to child care. Advised would discuss with providers but currently inductions at 39 weeks are not recommended for normal pregnancy. Verbalized understanding. Anticipatory guidance given.  Reviewed red flag symptoms and when to call the office.   Glorious Peach RN [facility] 01/03/20 12:46 PM

## 2020-01-03 NOTE — Patient Instructions (Addendum)
Fetal Movement Counts Patient Name: ________________________________________________ Patient Due Date: ____________________ What is a fetal movement count?  A fetal movement count is the number of times that you feel your baby move during a certain amount of time. This may also be called a fetal kick count. A fetal movement count is recommended for every pregnant woman. You may be asked to start counting fetal movements as early as week 28 of your pregnancy. Pay attention to when your baby is most active. You may notice your baby's sleep and wake cycles. You may also notice things that make your baby move more. You should do a fetal movement count:  When your baby is normally most active.  At the same time each day. A good time to count movements is while you are resting, after having something to eat and drink. How do I count fetal movements? 1. Find a quiet, comfortable area. Sit, or lie down on your side. 2. Write down the date, the start time and stop time, and the number of movements that you felt between those two times. Take this information with you to your health care visits. 3. Write down your start time when you feel the first movement. 4. Count kicks, flutters, swishes, rolls, and jabs. You should feel at least 10 movements. 5. You may stop counting after you have felt 10 movements, or if you have been counting for 2 hours. Write down the stop time. 6. If you do not feel 10 movements in 2 hours, contact your health care provider for further instructions. Your health care provider may want to do additional tests to assess your baby's well-being. Contact a health care provider if:  You feel fewer than 10 movements in 2 hours.  Your baby is not moving like he or she usually does. Date: ____________ Start time: ____________ Stop time: ____________ Movements: ____________ Date: ____________ Start time: ____________ Stop time: ____________ Movements: ____________ Date: ____________  Start time: ____________ Stop time: ____________ Movements: ____________ Date: ____________ Start time: ____________ Stop time: ____________ Movements: ____________ Date: ____________ Start time: ____________ Stop time: ____________ Movements: ____________ Date: ____________ Start time: ____________ Stop time: ____________ Movements: ____________ Date: ____________ Start time: ____________ Stop time: ____________ Movements: ____________ Date: ____________ Start time: ____________ Stop time: ____________ Movements: ____________ Date: ____________ Start time: ____________ Stop time: ____________ Movements: ____________ This information is not intended to replace advice given to you by your health care provider. Make sure you discuss any questions you have with your health care provider. Document Revised: 03/07/2019 Document Reviewed: 03/07/2019 Elsevier Patient Education  2020 Elsevier Inc.   Vaginal Delivery  Vaginal delivery means that you give birth by pushing your baby out of your birth canal (vagina). A team of health care providers will help you before, during, and after vaginal delivery. Birth experiences are unique for every woman and every pregnancy, and birth experiences vary depending on where you choose to give birth. What happens when I arrive at the birth center or hospital? Once you are in labor and have been admitted into the hospital or birth center, your health care provider may:  Review your pregnancy history and any concerns that you have.  Insert an IV into one of your veins. This may be used to give you fluids and medicines.  Check your blood pressure, pulse, temperature, and heart rate (vital signs).  Check whether your bag of water (amniotic sac) has broken (ruptured).  Talk with you about your birth plan and discuss pain control options. Monitoring   Your health care provider may monitor your contractions (uterine monitoring) and your baby's heart rate (fetal  monitoring). You may need to be monitored:  Often, but not continuously (intermittently).  All the time or for long periods at a time (continuously). Continuous monitoring may be needed if: ? You are taking certain medicines, such as medicine to relieve pain or make your contractions stronger. ? You have pregnancy or labor complications. Monitoring may be done by:  Placing a special stethoscope or a handheld monitoring device on your abdomen to check your baby's heartbeat and to check for contractions.  Placing monitors on your abdomen (external monitors) to record your baby's heartbeat and the frequency and length of contractions.  Placing monitors inside your uterus through your vagina (internal monitors) to record your baby's heartbeat and the frequency, length, and strength of your contractions. Depending on the type of monitor, it may remain in your uterus or on your baby's head until birth.  Telemetry. This is a type of continuous monitoring that can be done with external or internal monitors. Instead of having to stay in bed, you are able to move around during telemetry. Physical exam Your health care provider may perform frequent physical exams. This may include:  Checking how and where your baby is positioned in your uterus.  Checking your cervix to determine: ? Whether it is thinning out (effacing). ? Whether it is opening up (dilating). What happens during labor and delivery?  Normal labor and delivery is divided into the following three stages: Stage 1  This is the longest stage of labor.  This stage can last for hours or days.  Throughout this stage, you will feel contractions. Contractions generally feel mild, infrequent, and irregular at first. They get stronger, more frequent (about every 2-3 minutes), and more regular as you move through this stage.  This stage ends when your cervix is completely dilated to 4 inches (10 cm) and completely effaced. Stage 2  This  stage starts once your cervix is completely effaced and dilated and lasts until the delivery of your baby.  This stage may last from 20 minutes to 2 hours.  This is the stage where you will feel an urge to push your baby out of your vagina.  You may feel stretching and burning pain, especially when the widest part of your baby's head passes through the vaginal opening (crowning).  Once your baby is delivered, the umbilical cord will be clamped and cut. This usually occurs after waiting a period of 1-2 minutes after delivery.  Your baby will be placed on your bare chest (skin-to-skin contact) in an upright position and covered with a warm blanket. Watch your baby for feeding cues, like rooting or sucking, and help the baby to your breast for his or her first feeding. Stage 3  This stage starts immediately after the birth of your baby and ends after you deliver the placenta.  This stage may take anywhere from 5 to 30 minutes.  After your baby has been delivered, you will feel contractions as your body expels the placenta and your uterus contracts to control bleeding. What can I expect after labor and delivery?  After labor is over, you and your baby will be monitored closely until you are ready to go home to ensure that you are both healthy. Your health care team will teach you how to care for yourself and your baby.  You and your baby will stay in the same room (rooming in) during your   hospital stay. This will encourage early bonding and successful breastfeeding.  You may continue to receive fluids and medicines through an IV.  Your uterus will be checked and massaged regularly (fundal massage).  You will have some soreness and pain in your abdomen, vagina, and the area of skin between your vaginal opening and your anus (perineum).  If an incision was made near your vagina (episiotomy) or if you had some vaginal tearing during delivery, cold compresses may be placed on your episiotomy or  your tear. This helps to reduce pain and swelling.  You may be given a squirt bottle to use instead of wiping when you go to the bathroom. To use the squirt bottle, follow these steps: ? Before you urinate, fill the squirt bottle with warm water. Do not use hot water. ? After you urinate, while you are sitting on the toilet, use the squirt bottle to rinse the area around your urethra and vaginal opening. This rinses away any urine and blood. ? Fill the squirt bottle with clean water every time you use the bathroom.  It is normal to have vaginal bleeding after delivery. Wear a sanitary pad for vaginal bleeding and discharge. Summary  Vaginal delivery means that you will give birth by pushing your baby out of your birth canal (vagina).  Your health care provider may monitor your contractions (uterine monitoring) and your baby's heart rate (fetal monitoring).  Your health care provider may perform a physical exam.  Normal labor and delivery is divided into three stages.  After labor is over, you and your baby will be monitored closely until you are ready to go home. This information is not intended to replace advice given to you by your health care provider. Make sure you discuss any questions you have with your health care provider. Document Revised: 08/22/2017 Document Reviewed: 08/22/2017 Elsevier Patient Education  2020 Elsevier Inc.  

## 2020-01-03 NOTE — Progress Notes (Signed)
I have seen, interviewed, and examined the patient in conjunction with the [facility] [employer] and affirm the diagnosis and management plan.   Gunnar Bulla, CNM [facility], [facility] 01/03/20 3:27 PM

## 2020-01-05 ENCOUNTER — Observation Stay
Admission: EM | Admit: 2020-01-05 | Discharge: 2020-01-05 | Disposition: A | Discharge status: Home / Self Care | Payer: Medicaid Other | Attending: Certified Nurse Midwife | Admitting: Certified Nurse Midwife

## 2020-01-05 ENCOUNTER — Other Ambulatory Visit: Payer: Self-pay

## 2020-01-05 ENCOUNTER — Encounter: Payer: Self-pay | Admitting: Certified Nurse Midwife

## 2020-01-05 DIAGNOSIS — O368130 Decreased fetal movements, third trimester, not applicable or unspecified: Secondary | ICD-10-CM | POA: Diagnosis not present

## 2020-01-05 DIAGNOSIS — F329 Major depressive disorder, single episode, unspecified: Secondary | ICD-10-CM | POA: Insufficient documentation

## 2020-01-05 DIAGNOSIS — Z3A38 38 weeks gestation of pregnancy: Secondary | ICD-10-CM | POA: Insufficient documentation

## 2020-01-05 DIAGNOSIS — N760 Acute vaginitis: Secondary | ICD-10-CM

## 2020-01-05 DIAGNOSIS — F419 Anxiety disorder, unspecified: Secondary | ICD-10-CM | POA: Diagnosis not present

## 2020-01-05 DIAGNOSIS — O368190 Decreased fetal movements, unspecified trimester, not applicable or unspecified: Secondary | ICD-10-CM | POA: Diagnosis present

## 2020-01-05 DIAGNOSIS — O99343 Other mental disorders complicating pregnancy, third trimester: Secondary | ICD-10-CM | POA: Diagnosis not present

## 2020-01-05 DIAGNOSIS — Z7982 Long term (current) use of aspirin: Secondary | ICD-10-CM | POA: Insufficient documentation

## 2020-01-05 DIAGNOSIS — B9689 Other specified bacterial agents as the cause of diseases classified elsewhere: Secondary | ICD-10-CM

## 2020-01-05 DIAGNOSIS — Z87898 Personal history of other specified conditions: Secondary | ICD-10-CM | POA: Diagnosis not present

## 2020-01-05 DIAGNOSIS — Z881 Allergy status to other antibiotic agents status: Secondary | ICD-10-CM | POA: Insufficient documentation

## 2020-01-05 LAB — WET PREP, GENITAL
Sperm: NONE SEEN
Trich, Wet Prep: NONE SEEN
Yeast Wet Prep HPF POC: NONE SEEN

## 2020-01-05 LAB — RUPTURE OF MEMBRANE (ROM)PLUS: Rom Plus: NEGATIVE

## 2020-01-05 MED ORDER — METRONIDAZOLE 500 MG PO TABS: 500.0000 mg | ORAL_TABLET | Freq: Two times a day (BID) | ORAL | 0 refills | Status: DC

## 2020-01-05 MED ORDER — ZOLPIDEM TARTRATE 5 MG PO TABS
5.0000 mg | ORAL_TABLET | Freq: Every evening | ORAL | Status: DC | PRN
  Administered 2020-01-05: 5 mg via ORAL
  Filled 2020-01-05: qty 1

## 2020-01-05 MED ORDER — METRONIDAZOLE 500 MG PO TABS
500.0000 mg | ORAL_TABLET | Freq: Two times a day (BID) | ORAL | Status: DC
  Administered 2020-01-05: 500 mg via ORAL
  Filled 2020-01-05: qty 1

## 2020-01-05 NOTE — Discharge Summary (Addendum)
Obstetric Discharge Summary  Patient ID: Tamara Bishop MRN: [id number] DOB/AGE: 1995/10/23 23 y.o.   Date of Admission: 01/05/2020  Date of Discharge:  01/05/20   Admitting Diagnosis: Observation at [redacted]w[redacted]d  Secondary Diagnosis: Decreased fetal movement, Anxiety and depression, History of severe pre-eclampsia, and Pelvic pain in pregnancy  Discharge Diagnosis: No other diagnosis   Antepartum Procedures: NST, ambulation and PO medications   Brief Hospital Course   L&D OB Triage Note  Tamara Bishop is a 24 y.o. G37P1001 female at [redacted]w[redacted]d, EDD Estimated Date of Delivery: 01/18/20 who presented to triage for complaints of leakage of fluid and decreased fetal movement.  She was evaluated by the nurses with no significant findings for fetal distress, labor or spontaneous rupture of membranes. Vital signs stable. ROM Plus was negative. An NST was performed and has been reviewed by CNM. She was treated with Flagyl and given Ambien to rest.   NST INTERPRETATION: Indications: rule out uterine contractions  Mode: External Baseline Rate (A): 130 bpm Variability: Moderate Accelerations: 15 x 15 Decelerations: None Contraction Frequency (min): 3-5  Impression: reactive  Dilation: 1.5 Effacement (%): 50, 60 Station: -3 Presentation: Vertex Exam by:: A. White, RN   Plan: NST performed was reviewed and was found to be reactive. She was discharged home with bleeding/labor precautions.  Continue routine prenatal care. Follow up with CNM as previously scheduled. Rx Flagyl sent to pharmacy on file.   Discharge Instructions: Per After Visit Summary.  Activity: Also refer to After Visit Summary.   Diet: Regular  Medications: Allergies as of 01/05/2020      Reactions   Azithromycin Other (See Comments)   Felt like her stomach was going to come out of her body      Medication List    TAKE these medications   aspirin EC 81 MG tablet Take 1 tablet (81 mg total) by mouth daily. Take  after 12 weeks for prevention of preeclampssia later in pregnancy   cyclobenzaprine 10 MG tablet Commonly known as: FLEXERIL Take 1 tablet (10 mg total) by mouth 3 (three) times daily as needed for muscle spasms.   Fusion Plus Caps Take 1 tablet by mouth daily.   metroNIDAZOLE 500 MG tablet Commonly known as: FLAGYL Take 1 tablet (500 mg total) by mouth every 12 (twelve) hours. Start taking on: January 06, 2020   multivitamin-prenatal 27-0.8 MG Tabs tablet Take 1 tablet by mouth daily at 12 noon.      Outpatient follow up:  Follow-up Information    Gunnar Bulla, CNM Follow up.   Specialties: Certified Nurse Midwife, Obstetrics and Gynecology, Radiology Why: As previously scheduled or sooner if needed Contact information: [address] [place] [address] [phone]          Postpartum contraception: unsure; will discuss further at postpartum visit  Discharged Condition: stable  Discharged to: home   Gunnar Bulla, CNM [facility], [facility] 01/05/20 11:33 PM

## 2020-01-05 NOTE — OB Triage Note (Signed)
Patient came in for observation for labor evaluation and leaking of fluid since 1600. Patient reports uterinecontractions three to five minutes. Patient rates pain 7/10. Patient complains of leaking of clear fluid and some mucous but denies vaginal bleeding and spotting. Vital signs stable and patient afebrile. FHR baseline 130 with moderate variability with accelerations 15 x 15 and no decelerations. Significant other at bedside. Monitors applied and assessing.

## 2020-01-10 ENCOUNTER — Ambulatory Visit (INDEPENDENT_AMBULATORY_CARE_PROVIDER_SITE_OTHER): Payer: Medicaid Other | Admitting: Certified Nurse Midwife

## 2020-01-10 VITALS — BP 127/86 | HR 94 | Wt 185.1 lb

## 2020-01-10 DIAGNOSIS — Z3493 Encounter for supervision of normal pregnancy, unspecified, third trimester: Secondary | ICD-10-CM

## 2020-01-10 DIAGNOSIS — Z3A38 38 weeks gestation of pregnancy: Secondary | ICD-10-CM

## 2020-01-10 LAB — POCT URINALYSIS DIPSTICK OB
Bilirubin, UA: NEGATIVE
Blood, UA: NEGATIVE
Glucose, UA: NEGATIVE
Ketones, UA: NEGATIVE
Leukocytes, UA: NEGATIVE
Nitrite, UA: NEGATIVE
POC,PROTEIN,UA: NEGATIVE
Spec Grav, UA: <=1.005 — AB (ref 1.010–1.025)
Urobilinogen, UA: 0.2 E.U./dL
pH, UA: 5 (ref 5.0–8.0)

## 2020-01-10 NOTE — Patient Instructions (Signed)
Labor Induction ° °Labor induction is when steps are taken to cause a pregnant woman to begin the labor process. Most women go into labor on their own between 37 weeks and 42 weeks of pregnancy. When this does not happen or when there is a medical need for labor to begin, steps may be taken to induce labor. Labor induction causes a pregnant woman's uterus to contract. It also causes the cervix to soften (ripen), open (dilate), and thin out (efface). Usually, labor is not induced before 39 weeks of pregnancy unless there is a medical reason to do so. Your health care provider will determine if labor induction is needed. °Before inducing labor, your health care provider will consider a number of factors, including: °· Your medical condition and your baby's. °· How many weeks along you are in your pregnancy. °· How mature your baby's lungs are. °· The condition of your cervix. °· The position of your baby. °· The size of your birth canal. °What are some reasons for labor induction? °Labor may be induced if: °· Your health or your baby's health is at risk. °· Your pregnancy is overdue by 1 week or more. °· Your water breaks but labor does not start on its own. °· There is a low amount of amniotic fluid around your baby. °You may also choose (elect) to have labor induced at a certain time. Generally, elective labor induction is done no earlier than 39 weeks of pregnancy. °What methods are used for labor induction? °Methods used for labor induction include: °· Prostaglandin medicine. This medicine starts contractions and causes the cervix to dilate and ripen. It can be taken by mouth (orally) or by being inserted into the vagina (suppository). °· Inserting a small, thin tube (catheter) with a balloon into the vagina and then expanding the balloon with water to dilate the cervix. °· Stripping the membranes. In this method, your health care provider gently separates amniotic sac tissue from the cervix. This causes the  cervix to stretch, which in turn causes the release of a hormone called progesterone. The hormone causes the uterus to contract. This procedure is often done during an office visit, after which you will be sent home to wait for contractions to begin. °· Breaking the water. In this method, your health care provider uses a small instrument to make a small hole in the amniotic sac. This eventually causes the amniotic sac to break. Contractions should begin after a few hours. °· Medicine to trigger or strengthen contractions. This medicine is given through an IV that is inserted into a vein in your arm. °Except for membrane stripping, which can be done in a clinic, labor induction is done in the hospital so that you and your baby can be carefully monitored. °How long does it take for labor to be induced? °The length of time it takes to induce labor depends on how ready your body is for labor. Some inductions can take up to 2-3 days, while others may take less than a day. Induction may take longer if: °· You are induced early in your pregnancy. °· It is your first pregnancy. °· Your cervix is not ready. °What are some risks associated with labor induction? °Some risks associated with labor induction include: °· Changes in fetal heart rate, such as being too high, too low, or irregular (erratic). °· Failed induction. °· Infection in the mother or the baby. °· Increased risk of having a cesarean delivery. °· Fetal death. °· Breaking off (abruption)   of the placenta from the uterus (rare). °· Rupture of the uterus (very rare). °When induction is needed for medical reasons, the benefits of induction generally outweigh the risks. °What are some reasons for not inducing labor? °Labor induction should not be done if: °· Your baby does not tolerate contractions. °· You have had previous surgeries on your uterus, such as a myomectomy, removal of fibroids, or a vertical scar from a previous cesarean delivery. °· Your placenta lies  very low in your uterus and blocks the opening of the cervix (placenta previa). °· Your baby is not in a head-down position. °· The umbilical cord drops down into the birth canal in front of the baby. °· There are unusual circumstances, such as the baby being very early (premature). °· You have had more than 2 previous cesarean deliveries. °Summary °· Labor induction is when steps are taken to cause a pregnant woman to begin the labor process. °· Labor induction causes a pregnant woman's uterus to contract. It also causes the cervix to ripen, dilate, and efface. °· Labor is not induced before 39 weeks of pregnancy unless there is a medical reason to do so. °· When induction is needed for medical reasons, the benefits of induction generally outweigh the risks. °This information is not intended to replace advice given to you by your health care provider. Make sure you discuss any questions you have with your health care provider. °Document Revised: 07/21/2017 Document Reviewed: 08/31/2016 °Elsevier Patient Education © 2020 [employer] ° ° °Fetal Movement Counts °Patient Name: ________________________________________________ Patient Due Date: ____________________ °What is a fetal movement count? ° °A fetal movement count is the number of times that you feel your baby move during a certain amount of time. This may also be called a fetal kick count. A fetal movement count is recommended for every pregnant woman. You may be asked to start counting fetal movements as early as week 28 of your pregnancy. °Pay attention to when your baby is most active. You may notice your baby's sleep and wake cycles. You may also notice things that make your baby move more. You should do a fetal movement count: °· When your baby is normally most active. °· At the same time each day. °A good time to count movements is while you are resting, after having something to eat and drink. °How do I count fetal movements? °1. Find a quiet,  comfortable area. Sit, or lie down on your side. °2. Write down the date, the start time and stop time, and the number of movements that you felt between those two times. Take this information with you to your health care visits. °3. Write down your start time when you feel the first movement. °4. Count kicks, flutters, swishes, rolls, and jabs. You should feel at least 10 movements. °5. You may stop counting after you have felt 10 movements, or if you have been counting for 2 hours. Write down the stop time. °6. If you do not feel 10 movements in 2 hours, contact your health care provider for further instructions. Your health care provider may want to do additional tests to assess your baby's well-being. °Contact a health care provider if: °· You feel fewer than 10 movements in 2 hours. °· Your baby is not moving like he or she usually does. °Date: ____________ Start time: ____________ Stop time: ____________ Movements: ____________ °Date: ____________ Start time: ____________ Stop time: ____________ Movements: ____________ °Date: ____________ Start time: ____________ Stop time: ____________ Movements: ____________ °  Date: ____________ Start time: ____________ Stop time: ____________ Movements: ____________ °Date: ____________ Start time: ____________ Stop time: ____________ Movements: ____________ °Date: ____________ Start time: ____________ Stop time: ____________ Movements: ____________ °Date: ____________ Start time: ____________ Stop time: ____________ Movements: ____________ °Date: ____________ Start time: ____________ Stop time: ____________ Movements: ____________ °Date: ____________ Start time: ____________ Stop time: ____________ Movements: ____________ °This information is not intended to replace advice given to you by your health care provider. Make sure you discuss any questions you have with your health care provider. °Document Revised: 03/07/2019 Document Reviewed: 03/07/2019 °Elsevier Patient  Education © 2020 Elsevier Inc. ° °

## 2020-01-10 NOTE — Progress Notes (Signed)
ROB-Reports irregular contractions and pelvic pressure; request SVE and membrane sweep. Requests for 39 week IOL denied. Patient agreeable to postdates IOL. Anticipatory guidance regarding course of prenatal care. Reviewed red flag symptoms and when to call. RTC x 1 week for ROB or sooner if needed

## 2020-01-15 ENCOUNTER — Other Ambulatory Visit: Payer: Self-pay

## 2020-01-15 ENCOUNTER — Ambulatory Visit (INDEPENDENT_AMBULATORY_CARE_PROVIDER_SITE_OTHER): Payer: Medicaid Other | Admitting: Certified Nurse Midwife

## 2020-01-15 ENCOUNTER — Encounter: Payer: Self-pay | Admitting: Certified Nurse Midwife

## 2020-01-15 ENCOUNTER — Inpatient Hospital Stay
Admission: AD | Admit: 2020-01-15 | Discharge: 2020-01-17 | DRG: 806 | Disposition: A | Discharge status: Home / Self Care | Payer: Medicaid Other | Attending: Certified Nurse Midwife | Admitting: Certified Nurse Midwife

## 2020-01-15 ENCOUNTER — Encounter: Payer: Self-pay | Admitting: Obstetrics and Gynecology

## 2020-01-15 VITALS — BP 142/87 | HR 92 | Wt 187.4 lb

## 2020-01-15 DIAGNOSIS — Z3A39 39 weeks gestation of pregnancy: Secondary | ICD-10-CM

## 2020-01-15 DIAGNOSIS — D62 Acute posthemorrhagic anemia: Secondary | ICD-10-CM | POA: Diagnosis not present

## 2020-01-15 DIAGNOSIS — O99334 Smoking (tobacco) complicating childbirth: Secondary | ICD-10-CM | POA: Diagnosis present

## 2020-01-15 DIAGNOSIS — F17210 Nicotine dependence, cigarettes, uncomplicated: Secondary | ICD-10-CM | POA: Diagnosis present

## 2020-01-15 DIAGNOSIS — Z20822 Contact with and (suspected) exposure to covid-19: Secondary | ICD-10-CM | POA: Diagnosis present

## 2020-01-15 DIAGNOSIS — Z8759 Personal history of other complications of pregnancy, childbirth and the puerperium: Secondary | ICD-10-CM

## 2020-01-15 DIAGNOSIS — O9081 Anemia of the puerperium: Secondary | ICD-10-CM | POA: Diagnosis not present

## 2020-01-15 DIAGNOSIS — O1404 Mild to moderate pre-eclampsia, complicating childbirth: Principal | ICD-10-CM | POA: Diagnosis present

## 2020-01-15 HISTORY — DX: Anemia, unspecified: D64.9

## 2020-01-15 HISTORY — DX: Essential (primary) hypertension: I10

## 2020-01-15 LAB — POCT URINALYSIS DIPSTICK OB
Bilirubin, UA: NEGATIVE
Blood, UA: NEGATIVE
Glucose, UA: NEGATIVE
Ketones, UA: NEGATIVE
Leukocytes, UA: NEGATIVE
Nitrite, UA: NEGATIVE
POC,PROTEIN,UA: NEGATIVE
Spec Grav, UA: 1.015 (ref 1.010–1.025)
Urobilinogen, UA: 0.2 E.U./dL
pH, UA: 5 (ref 5.0–8.0)

## 2020-01-15 LAB — COMPREHENSIVE METABOLIC PANEL
ALT: 7 IU/L (ref 0–32)
AST: 18 IU/L (ref 0–40)
Albumin/Globulin Ratio: 1.4 (ref 1.2–2.2)
Albumin: 3.3 g/dL — ABNORMAL LOW (ref 3.9–5.0)
Alkaline Phosphatase: 275 IU/L — ABNORMAL HIGH (ref 48–121)
BUN/Creatinine Ratio: 9 (ref 9–23)
BUN: 6 mg/dL (ref 6–20)
Bilirubin Total: 0.2 mg/dL (ref 0.0–1.2)
CO2: 20 mmol/L (ref 20–29)
Calcium: 9.2 mg/dL (ref 8.7–10.2)
Chloride: 106 mmol/L (ref 96–106)
Creatinine, Ser: 0.65 mg/dL (ref 0.57–1.00)
GFR calc Af Amer: 145 mL/min/{1.73_m2} (ref >59)
GFR calc non Af Amer: 126 mL/min/{1.73_m2} (ref >59)
Globulin, Total: 2.4 g/dL (ref 1.5–4.5)
Glucose: 74 mg/dL (ref 65–99)
Potassium: 3.9 mmol/L (ref 3.5–5.2)
Sodium: 140 mmol/L (ref 134–144)
Total Protein: 5.7 g/dL — ABNORMAL LOW (ref 6.0–8.5)

## 2020-01-15 LAB — CBC
HCT: 30 % — ABNORMAL LOW (ref 36.0–46.0)
Hematocrit: 29.9 % — ABNORMAL LOW (ref 34.0–46.6)
Hemoglobin: 9.9 g/dL — ABNORMAL LOW (ref 11.1–15.9)
Hemoglobin: 9.7 g/dL — ABNORMAL LOW (ref 12.0–15.0)
MCH: 26.8 pg (ref 26.6–33.0)
MCH: 26.6 pg (ref 26.0–34.0)
MCHC: 33.1 g/dL (ref 31.5–35.7)
MCHC: 32.3 g/dL (ref 30.0–36.0)
MCV: 81 fL (ref 79–97)
MCV: 82.2 fL (ref 80.0–100.0)
Platelets: 154 10*3/uL (ref 150–450)
Platelets: 164 10*3/uL (ref 150–400)
RBC: 3.69 x10E6/uL — ABNORMAL LOW (ref 3.77–5.28)
RBC: 3.65 MIL/uL — ABNORMAL LOW (ref 3.87–5.11)
RDW: 13.6 % (ref 11.7–15.4)
RDW: 14.1 % (ref 11.5–15.5)
WBC: 10.9 10*3/uL — ABNORMAL HIGH (ref 3.4–10.8)
WBC: 12.2 10*3/uL — ABNORMAL HIGH (ref 4.0–10.5)
nRBC: 0 % (ref 0.0–0.2)

## 2020-01-15 LAB — RESPIRATORY PANEL BY RT PCR (FLU A&B, COVID)
Influenza A by PCR: NEGATIVE
Influenza B by PCR: NEGATIVE
SARS Coronavirus 2 by RT PCR: NEGATIVE

## 2020-01-15 LAB — PROTEIN / CREATININE RATIO, URINE
Creatinine, Urine: 35.7 mg/dL
Protein, Ur: 14.6 mg/dL
Protein/Creat Ratio: 409 mg/g creat — ABNORMAL HIGH (ref 0–200)

## 2020-01-15 LAB — TYPE AND SCREEN
ABO/RH(D): A POS
Antibody Screen: NEGATIVE

## 2020-01-15 MED ORDER — OXYTOCIN BOLUS FROM INFUSION
333.0000 mL | Freq: Once | INTRAVENOUS | Status: AC
  Administered 2020-01-16: 333 mL via INTRAVENOUS

## 2020-01-15 MED ORDER — OXYTOCIN-SODIUM CHLORIDE 30-0.9 UT/500ML-% IV SOLN
1.0000 m[IU]/min | INTRAVENOUS | Status: DC
  Administered 2020-01-16: 4 m[IU]/min via INTRAVENOUS

## 2020-01-15 MED ORDER — OXYTOCIN-SODIUM CHLORIDE 30-0.9 UT/500ML-% IV SOLN
2.5000 [IU]/h | INTRAVENOUS | Status: DC
  Filled 2020-01-15: qty 1000

## 2020-01-15 MED ORDER — AMMONIA AROMATIC IN INHA
RESPIRATORY_TRACT | Status: AC
  Filled 2020-01-15: qty 10

## 2020-01-15 MED ORDER — FENTANYL 2.5 MCG/ML W/ROPIVACAINE 0.15% IN NS 100 ML EPIDURAL (ARMC)
EPIDURAL | Status: AC
  Filled 2020-01-15: qty 100

## 2020-01-15 MED ORDER — SOD CITRATE-CITRIC ACID 500-334 MG/5ML PO SOLN: 30.0000 mL | ORAL | Status: DC | PRN

## 2020-01-15 MED ORDER — BUTORPHANOL TARTRATE 1 MG/ML IJ SOLN: 1.0000 mg | INTRAMUSCULAR | Status: DC | PRN

## 2020-01-15 MED ORDER — MISOPROSTOL 200 MCG PO TABS
ORAL_TABLET | ORAL | Status: AC
  Filled 2020-01-15: qty 4

## 2020-01-15 MED ORDER — LIDOCAINE HCL (PF) 1 % IJ SOLN
30.0000 mL | INTRAMUSCULAR | Status: DC | PRN
  Filled 2020-01-15: qty 30

## 2020-01-15 MED ORDER — LACTATED RINGERS IV SOLN: INTRAVENOUS | Status: DC

## 2020-01-15 MED ORDER — LACTATED RINGERS IV SOLN
500.0000 mL | INTRAVENOUS | Status: DC | PRN
  Administered 2020-01-16: 500 mL via INTRAVENOUS

## 2020-01-15 MED ORDER — MISOPROSTOL 50MCG HALF TABLET
50.0000 ug | ORAL_TABLET | ORAL | Status: DC
  Administered 2020-01-15: 50 ug via VAGINAL
  Filled 2020-01-15: qty 1

## 2020-01-15 MED ORDER — TERBUTALINE SULFATE 1 MG/ML IJ SOLN: 0.2500 mg | Freq: Once | INTRAMUSCULAR | Status: DC | PRN

## 2020-01-15 MED ORDER — ACETAMINOPHEN 325 MG PO TABS: 650.0000 mg | ORAL_TABLET | ORAL | Status: DC | PRN

## 2020-01-15 MED ORDER — OXYTOCIN 10 UNIT/ML IJ SOLN
INTRAMUSCULAR | Status: AC
  Filled 2020-01-15: qty 2

## 2020-01-15 MED ORDER — ONDANSETRON HCL 4 MG/2ML IJ SOLN: 4.0000 mg | Freq: Four times a day (QID) | INTRAMUSCULAR | Status: DC | PRN

## 2020-01-15 NOTE — H&P (Signed)
History and Physical   HPI  Tamara Bishop is a 24 y.o. G2P1001 at [redacted]w[redacted]d Estimated Date of Delivery: 01/18/20 who is being admitted for induction of labor for mild pre eclampsia.    OB History  OB History  Gravida Para Term Preterm AB Living  2 1 1  0 0 1  SAB TAB Ectopic Multiple Live Births  0 0 0 0 1    # Outcome Date GA Lbr Len/2nd Weight Sex Delivery Anes PTL Lv  2 Current           1 Term 10/17/18 [redacted]w[redacted]d / 00:46 2970 g F Vag-Spont EPI  LIV     Complications: Preeclampsia     Name: Piper     Apgar1: 8  Apgar5: 9    PROBLEM LIST  Pregnancy complications or risks: Patient Active Problem List   Diagnosis Date Noted  . Decreased fetal movement 01/05/2020  . Bacterial vaginosis   . Labor and delivery, indication for care 12/19/2019  . Pelvic pain affecting pregnancy in second trimester, antepartum 05/31/2019  . Nausea/vomiting in pregnancy 05/31/2019  . Shortness of breath   . History of severe pre-eclampsia   . Vitamin D deficiency 10/16/2018  . B12 deficiency 10/16/2018  . Pregnancy 10/14/2018  . Low grade 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) 05/01/2018  . History of marijuana use 04/10/2018  . Acute pharyngitis 07/13/2015  . Dyspepsia 06/02/2015  . Family history of lupus erythematosus 06/02/2015  . Adjustment disorder with anxiety   . Allergic rhinitis   . Insomnia   . Anxiety and depression 02/16/2015    Prenatal labs and studies: ABO, Rh: A/Positive/-- (11/12 1151) Antibody: Negative (11/12 1151) Rubella: 1.47 (11/12 1151) RPR: Non Reactive (04/05 1124)  HBsAg: Negative (11/12 1151)  HIV: Non Reactive (11/12 1151)  [birth year]1977-- (05/20 1212)   Past Medical History:  Diagnosis Date  . Adjustment disorder with anxiety   . Allergic rhinitis   . Anemia   . Anxiety   . Cigarette smoker   . Depression   . Hypertension   . Insomnia   . Tobacco abuse      Past Surgical History:  Procedure Laterality Date  .  NO PAST SURGERIES    . WISDOM TOOTH EXTRACTION       Medications    Current Discharge Medication List    CONTINUE these medications which have NOT CHANGED   Details  aspirin EC 81 MG tablet Take 1 tablet (81 mg total) by mouth daily. Take after 12 weeks for prevention of preeclampssia later in pregnancy Qty: 300 tablet, Refills: 2    Iron-FA-B Cmp-C-Biot-Probiotic (FUSION PLUS) CAPS Take 1 tablet by mouth daily. Qty: 30 capsule, Refills: 6    metroNIDAZOLE (FLAGYL) 500 MG tablet Take 1 tablet (500 mg total) by mouth every 12 (twelve) hours. Qty: 13 tablet, Refills: 0    Prenatal Vit-Fe Fumarate-FA (MULTIVITAMIN-PRENATAL) 27-0.8 MG TABS tablet Take 1 tablet by mouth daily at 12 noon.         Allergies  Azithromycin  Review of Systems  Constitutional: negative Eyes: negative Ears, nose, mouth, throat, and face: negative Respiratory: negative Cardiovascular: negative Gastrointestinal: negative Genitourinary:negative Integument/breast: negative Hematologic/lymphatic: negative Musculoskeletal:negative Neurological: negative Behavioral/Psych: negative Endocrine: negative Allergic/Immunologic: negative  Physical Exam  BP 135/87 (BP Location: Left Arm)   Pulse (!) 52   Temp 98.9 F (37.2 C) (Oral)   Resp 20   Ht 5' 1.5" (1.562 m)   Wt 85 kg  LMP 04/14/2019 (Approximate)   BMI 34.83 kg/m   Lungs:  CTA B Cardio: RRR  Abd: Soft, gravid, NT Presentation: cephalic EXT: No C/C/ 1+ Edema DTRs: 2+ B CERVIX: Dilation: 4 Effacement (%): 70 Cervical Position: Middle Station: -2 Presentation: Vertex Exam by:: A. Grandville Silos CNM  See Prenatal records for more detailed PE.     FHR:  Baseline: 135 bpm, Variability: Good {> 6 bpm), Accelerations: Reactive and Decelerations: Absent  Toco: Uterine Contractions: irregular  Test Results  Results for orders placed or performed during the hospital encounter of 01/15/20 (from the past 24 hour(s))  CBC     Status:  Abnormal   Collection Time: 01/15/20  9:14 PM  Result Value Ref Range   WBC 12.2 (H) 4.0 - 10.5 K/uL   RBC 3.65 (L) 3.87 - 5.11 MIL/uL   Hemoglobin 9.7 (L) 12.0 - 15.0 g/dL   HCT 30.0 (L) 36 - 46 %   MCV 82.2 80.0 - 100.0 fL   MCH 26.6 26.0 - 34.0 pg   MCHC 32.3 30.0 - 36.0 g/dL   RDW 14.1 11.5 - 15.5 %   Platelets 164 150 - 400 K/uL   nRBC 0.0 0.0 - 0.2 %   Group B Strep negative  Assessment   G2P1001 at [redacted]w[redacted]d Estimated Date of Delivery: 01/18/20 Mild pre eclampsia  The fetus is reassuring.   Patient Active Problem List   Diagnosis Date Noted  . Decreased fetal movement 01/05/2020  . Bacterial vaginosis   . Labor and delivery, indication for care 12/19/2019  . Pelvic pain affecting pregnancy in second trimester, antepartum 05/31/2019  . Nausea/vomiting in pregnancy 05/31/2019  . Shortness of breath   . History of severe pre-eclampsia   . Vitamin D deficiency 10/16/2018  . B12 deficiency 10/16/2018  . Pregnancy 10/14/2018  . Low grade 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) 05/01/2018  . History of marijuana use 04/10/2018  . Acute pharyngitis 07/13/2015  . Dyspepsia 06/02/2015  . Family history of lupus erythematosus 06/02/2015  . Adjustment disorder with anxiety   . Allergic rhinitis   . Insomnia   . Anxiety and depression 02/16/2015    Plan  1. Admit to L&D :   2. EFM:-- Category 1 3. Stadol or Epidural if desired.   4. Admission labs  5. [place], CNM  01/15/2020 9:49 PM

## 2020-01-15 NOTE — Addendum Note (Signed)
Addended by: Mechele Claude on: 01/15/2020 04:39 PM   Modules accepted: Orders, SmartSet

## 2020-01-15 NOTE — Addendum Note (Signed)
Addended by: Brooke Dare on: 01/15/2020 02:02 PM   Modules accepted: Orders

## 2020-01-15 NOTE — Patient Instructions (Signed)
Braxton Hicks Contractions °Contractions of the uterus can occur throughout pregnancy, but they are not always a sign that you are in labor. You may have practice contractions called Braxton Hicks contractions. These false labor contractions are sometimes confused with true labor. °What are Braxton Hicks contractions? °Braxton Hicks contractions are tightening movements that occur in the muscles of the uterus before labor. Unlike true labor contractions, these contractions do not result in opening (dilation) and thinning of the cervix. Toward the end of pregnancy (32-34 weeks), Braxton Hicks contractions can happen more often and may become stronger. These contractions are sometimes difficult to tell apart from true labor because they can be very uncomfortable. You should not feel embarrassed if you go to the hospital with false labor. °Sometimes, the only way to tell if you are in true labor is for your health care provider to look for changes in the cervix. The health care provider will do a physical exam and may monitor your contractions. If you are not in true labor, the exam should show that your cervix is not dilating and your water has not broken. °If there are no other health problems associated with your pregnancy, it is completely safe for you to be sent home with false labor. You may continue to have Braxton Hicks contractions until you go into true labor. °How to tell the difference between true labor and false labor °True labor °· Contractions last 30-70 seconds. °· Contractions become very regular. °· Discomfort is usually felt in the top of the uterus, and it spreads to the lower abdomen and low back. °· Contractions do not go away with walking. °· Contractions usually become more intense and increase in frequency. °· The cervix dilates and gets thinner. °False labor °· Contractions are usually shorter and not as strong as true labor contractions. °· Contractions are usually irregular. °· Contractions  are often felt in the front of the lower abdomen and in the groin. °· Contractions may go away when you walk around or change positions while lying down. °· Contractions get weaker and are shorter-lasting as time goes on. °· The cervix usually does not dilate or become thin. °Follow these instructions at home: ° °· Take over-the-counter and prescription medicines only as told by your health care provider. °· Keep up with your usual exercises and follow other instructions from your health care provider. °· Eat and drink lightly if you think you are going into labor. °· If Braxton Hicks contractions are making you uncomfortable: °? Change your position from lying down or resting to walking, or change from walking to resting. °? Sit and rest in a tub of warm water. °? Drink enough fluid to keep your urine pale yellow. Dehydration may cause these contractions. °? Do slow and deep breathing several times an hour. °· Keep all follow-up prenatal visits as told by your health care provider. This is important. °Contact a health care provider if: °· You have a fever. °· You have continuous pain in your abdomen. °Get help right away if: °· Your contractions become stronger, more regular, and closer together. °· You have fluid leaking or gushing from your vagina. °· You pass blood-tinged mucus (bloody show). °· You have bleeding from your vagina. °· You have low back pain that you never had before. °· You feel your baby’s head pushing down and causing pelvic pressure. °· Your baby is not moving inside you as much as it used to. °Summary °· Contractions that occur before labor are   called Braxton Hicks contractions, false labor, or practice contractions. °· Braxton Hicks contractions are usually shorter, weaker, farther apart, and less regular than true labor contractions. True labor contractions usually become progressively stronger and regular, and they become more frequent. °· Manage discomfort from Braxton Hicks contractions  by changing position, resting in a warm bath, drinking plenty of water, or practicing deep breathing. °This information is not intended to replace advice given to you by your health care provider. Make sure you discuss any questions you have with your health care provider. °Document Revised: 06/30/2017 Document Reviewed: 12/01/2016 °Elsevier Patient Education © 2020 Elsevier Inc. ° °

## 2020-01-15 NOTE — Progress Notes (Signed)
LABOR NOTE   Tamara Bishop 23 y.o.@ at [redacted]w[redacted]d  SUBJECTIVE:  Doing well , having some mild pain and pressure.  Analgesia:  None , plans epidural .   OBJECTIVE:  BP 134/77   Pulse (!) 58   Temp 98.9 F (37.2 C) (Oral)   Resp 20   Ht 5' 1.5" (1.562 m)   Wt 85 kg   LMP 04/14/2019 (Approximate)   BMI 34.83 kg/m  No intake/output data recorded.  She has shown cervical change. CERVIX: 6:  70%:   -2:   mid position:   soft SVE:   Dilation: 6 Effacement (%): 70 Station: -2 Exam by:: A. Vallery Mcdade CNM CONTRACTIONS: regular, every 2-4 minutes FHR: Fetal heart tracing reviewed. Baseline: 120 bpm, Variability: Good {> 6 bpm), Accelerations: Reactive and Decelerations: Absent Category I    Labs: Lab Results  Component Value Date   WBC 12.2 (H) 01/15/2020   HGB 9.7 (L) 01/15/2020   HCT 30.0 (L) 01/15/2020   MCV 82.2 01/15/2020   PLT 164 01/15/2020    ASSESSMENT: 1) Labor curve reviewed.       Progress: Active phase labor.     Membranes: ruptured, clear fluid         Active Problems:   Labor and delivery, indication for care mild pre eclampsia   PLAN: continue present management and IV Pitocin augmentation @ 0130   [name], CNM  01/15/2020 11:41 PM

## 2020-01-15 NOTE — Progress Notes (Signed)
ROB doing well. Feels good fetal movement. Discussed u/s for growth/AFI at 40 wks. She verbalizes understanding. SVE per pt request. 3-4/60/-2, BP elevated today 128/93, repeat 142/84. Pt denies headaches or epigastric pain. Stat labs collected. Will follow up with results. Return 1 wk.   [name], CNM

## 2020-01-16 ENCOUNTER — Inpatient Hospital Stay: Payer: Medicaid Other | Admitting: Anesthesiology

## 2020-01-16 ENCOUNTER — Encounter: Payer: Self-pay | Admitting: Obstetrics and Gynecology

## 2020-01-16 LAB — CBC
HCT: 27.9 % — ABNORMAL LOW (ref 36.0–46.0)
Hemoglobin: 8.9 g/dL — ABNORMAL LOW (ref 12.0–15.0)
MCH: 26.4 pg (ref 26.0–34.0)
MCHC: 31.9 g/dL (ref 30.0–36.0)
MCV: 82.8 fL (ref 80.0–100.0)
Platelets: 136 10*3/uL — ABNORMAL LOW (ref 150–400)
RBC: 3.37 MIL/uL — ABNORMAL LOW (ref 3.87–5.11)
RDW: 14.2 % (ref 11.5–15.5)
WBC: 15.8 10*3/uL — ABNORMAL HIGH (ref 4.0–10.5)
nRBC: 0 % (ref 0.0–0.2)

## 2020-01-16 LAB — RPR: RPR Ser Ql: NONREACTIVE

## 2020-01-16 MED ORDER — PHENYLEPHRINE 40 MCG/ML (10ML) SYRINGE FOR IV PUSH (FOR BLOOD PRESSURE SUPPORT)
80.0000 ug | PREFILLED_SYRINGE | INTRAVENOUS | Status: DC | PRN
  Filled 2020-01-16: qty 10

## 2020-01-16 MED ORDER — PRENATAL MULTIVITAMIN CH
1.0000 | ORAL_TABLET | Freq: Every day | ORAL | Status: DC
  Administered 2020-01-16 – 2020-01-17 (×2): 1 via ORAL
  Filled 2020-01-16 (×2): qty 1

## 2020-01-16 MED ORDER — LACTATED RINGERS IV SOLN
500.0000 mL | Freq: Once | INTRAVENOUS | Status: AC
  Administered 2020-01-16: 500 mL via INTRAVENOUS

## 2020-01-16 MED ORDER — DIBUCAINE (PERIANAL) 1 % EX OINT: 1.0000 "application " | TOPICAL_OINTMENT | CUTANEOUS | Status: DC | PRN

## 2020-01-16 MED ORDER — FERROUS SULFATE 325 (65 FE) MG PO TABS
325.0000 mg | ORAL_TABLET | Freq: Every day | ORAL | Status: DC
  Administered 2020-01-16 – 2020-01-17 (×2): 325 mg via ORAL
  Filled 2020-01-16 (×2): qty 1

## 2020-01-16 MED ORDER — METHYLERGONOVINE MALEATE 0.2 MG PO TABS: 0.2000 mg | ORAL_TABLET | ORAL | Status: DC | PRN

## 2020-01-16 MED ORDER — ONDANSETRON HCL 4 MG/2ML IJ SOLN: 4.0000 mg | INTRAMUSCULAR | Status: DC | PRN

## 2020-01-16 MED ORDER — ONDANSETRON HCL 4 MG PO TABS: 4.0000 mg | ORAL_TABLET | ORAL | Status: DC | PRN

## 2020-01-16 MED ORDER — IBUPROFEN 600 MG PO TABS
600.0000 mg | ORAL_TABLET | Freq: Four times a day (QID) | ORAL | Status: DC
  Administered 2020-01-16 – 2020-01-17 (×6): 600 mg via ORAL
  Filled 2020-01-16 (×6): qty 1

## 2020-01-16 MED ORDER — ACETAMINOPHEN 325 MG PO TABS: 650.0000 mg | ORAL_TABLET | ORAL | Status: DC | PRN

## 2020-01-16 MED ORDER — OXYCODONE-ACETAMINOPHEN 5-325 MG PO TABS: 2.0000 | ORAL_TABLET | ORAL | Status: DC | PRN

## 2020-01-16 MED ORDER — LACTATED RINGERS IV SOLN: INTRAVENOUS | Status: DC

## 2020-01-16 MED ORDER — SENNOSIDES-DOCUSATE SODIUM 8.6-50 MG PO TABS
2.0000 | ORAL_TABLET | ORAL | Status: DC
  Administered 2020-01-16: 2 via ORAL
  Filled 2020-01-16: qty 2

## 2020-01-16 MED ORDER — METHYLERGONOVINE MALEATE 0.2 MG/ML IJ SOLN: 0.2000 mg | INTRAMUSCULAR | Status: DC | PRN

## 2020-01-16 MED ORDER — CALCIUM CARBONATE ANTACID 500 MG PO CHEW: 2.0000 | CHEWABLE_TABLET | Freq: Once | ORAL | Status: AC

## 2020-01-16 MED ORDER — DIPHENHYDRAMINE HCL 50 MG/ML IJ SOLN: 12.5000 mg | INTRAMUSCULAR | Status: DC | PRN

## 2020-01-16 MED ORDER — OXYCODONE-ACETAMINOPHEN 5-325 MG PO TABS: 1.0000 | ORAL_TABLET | ORAL | Status: DC | PRN

## 2020-01-16 MED ORDER — WITCH HAZEL-GLYCERIN EX PADS: 1.0000 "application " | MEDICATED_PAD | CUTANEOUS | Status: DC | PRN

## 2020-01-16 MED ORDER — FENTANYL 2.5 MCG/ML W/ROPIVACAINE 0.15% IN NS 100 ML EPIDURAL (ARMC)
EPIDURAL | Status: DC | PRN
  Administered 2020-01-16: 12 mL/h via EPIDURAL

## 2020-01-16 MED ORDER — SIMETHICONE 80 MG PO CHEW: 80.0000 mg | CHEWABLE_TABLET | ORAL | Status: DC | PRN

## 2020-01-16 MED ORDER — DOCUSATE SODIUM 100 MG PO CAPS
100.0000 mg | ORAL_CAPSULE | Freq: Two times a day (BID) | ORAL | Status: DC
  Administered 2020-01-16 – 2020-01-17 (×2): 100 mg via ORAL
  Filled 2020-01-16 (×2): qty 1

## 2020-01-16 MED ORDER — EPHEDRINE 5 MG/ML INJ
10.0000 mg | INTRAVENOUS | Status: DC | PRN
  Filled 2020-01-16: qty 2

## 2020-01-16 MED ORDER — BENZOCAINE-MENTHOL 20-0.5 % EX AERO
1.0000 "application " | INHALATION_SPRAY | CUTANEOUS | Status: DC | PRN
  Filled 2020-01-16: qty 56

## 2020-01-16 MED ORDER — COCONUT OIL OIL
1.0000 "application " | TOPICAL_OIL | Status: DC | PRN
  Filled 2020-01-16: qty 120

## 2020-01-16 MED ORDER — FENTANYL 2.5 MCG/ML W/ROPIVACAINE 0.15% IN NS 100 ML EPIDURAL (ARMC): 12.0000 mL/h | EPIDURAL | Status: DC

## 2020-01-16 MED ORDER — SODIUM CHLORIDE 0.9 % IV SOLN
INTRAVENOUS | Status: DC | PRN
  Administered 2020-01-16 (×3): 5 mL via EPIDURAL

## 2020-01-16 MED ORDER — CALCIUM CARBONATE ANTACID 500 MG PO CHEW
CHEWABLE_TABLET | ORAL | Status: AC
  Administered 2020-01-16: 400 mg via ORAL
  Filled 2020-01-16: qty 2

## 2020-01-16 MED ORDER — LIDOCAINE HCL (PF) 1 % IJ SOLN
INTRAMUSCULAR | Status: DC | PRN
  Administered 2020-01-16: 2 mL

## 2020-01-16 NOTE — Anesthesia Procedure Notes (Signed)
Epidural Patient location during procedure: OB Start time: 01/16/2020 12:04 AM End time: 01/16/2020 12:19 AM  Staffing Anesthesiologist: Karleen Hampshire, MD Performed: anesthesiologist   Preanesthetic Checklist Completed: patient identified, IV checked, site marked, risks and benefits discussed, surgical consent, monitors and equipment checked, pre-op evaluation and timeout performed  Epidural Patient position: sitting Prep: ChloraPrep Patient monitoring: heart rate, continuous pulse ox and blood pressure Approach: midline Location: L4-L5 Injection technique: LOR saline  Needle:  Needle type: Tuohy  Needle gauge: 18 G Needle length: 9 cm and 9 Needle insertion depth: 6 cm Catheter type: closed end flexible Catheter size: 20 Guage Catheter at skin depth: 10 cm Test dose: negative and Other  Assessment Events: blood not aspirated, injection not painful, no injection resistance, no paresthesia and negative IV test  Additional Notes Risks and benefits of procedure discussed with patient.  Risks including but not limited to infection, spinal/epidural hematoma, nerve injury, post dural puncture headache, and inadequate/failed block.  Patient expressed understanding and consented to epidural placement. Negative dural puncture.  Negative aspiration.  Negative paresthesia on injection.  Dose given in divided aliquots.  Patient tolerated the procedure well with no immediate complications.   Reason for block:procedure for pain

## 2020-01-16 NOTE — Progress Notes (Signed)
LABOR NOTE   Tamara Bishop 23 y.o.@ at [redacted]w[redacted]d  SUBJECTIVE: Comfortable with epidural. Denies any urge to push.  Analgesia: Epidural  OBJECTIVE:  BP 140/90    Pulse (!) 58    Temp 97.9 F (36.6 C) (Oral)    Resp 20    Ht 5' 1.5" (1.562 m)    Wt 85 kg    LMP 04/14/2019 (Approximate)    SpO2 98%    BMI 34.83 kg/m  Total I/O In: 1.8 [I.V.:1.8] Out: -   She has shown cervical change. CERVIX: 7cm:  90%:   -2:   mid position:   soft SVE:   Dilation: 6 Effacement (%): 70 Station: -2, -1 Exam by:: A. White, RN CONTRACTIONS: regular, every 3-4 minutes FHR: Fetal heart tracing reviewed. Baseline: 110 bpm, Variability: Good {> 6 bpm), Accelerations: Reactive and Decelerations: variable and prolonged Category II IUPC placed and FSE placed. Amnio infusion started.   Labs: Lab Results  Component Value Date   WBC 12.2 (H) 01/15/2020   HGB 9.7 (L) 01/15/2020   HCT 30.0 (L) 01/15/2020   MCV 82.2 01/15/2020   PLT 164 01/15/2020    ASSESSMENT: 1) Labor curve reviewed.       Progress: Active phase labor.     Membranes: ruptured, clear fluid, iupc     IUPC  Active Problems:   Labor and delivery, indication for care   PLAN: IV fluid bolus, change maternal position, amnioinfusion and FSE. Dr. Logan Bores consulted on plan of care.    [name], CNM  01/16/2020 2:39 AM

## 2020-01-16 NOTE — Lactation Note (Signed)
This note was copied from a baby's chart. Lactation Consultation Note  Patient Name: Tamara Bishop [id number] Date: 01/16/2020   Mom asleep when [name] entered; will check back later.    [name] 01/16/2020, 10:24 AM

## 2020-01-16 NOTE — Anesthesia Preprocedure Evaluation (Signed)
Anesthesia Evaluation  Patient identified by MRN, date of birth, ID band Patient awake    Reviewed: Allergy & Precautions, H&P , NPO status , Patient's Chart, lab work & pertinent test results  Airway Mallampati: I  TM Distance: >3 FB Neck ROM: full    Dental  (+) Chipped, Teeth Intact   Pulmonary former smoker,           Cardiovascular Exercise Tolerance: Good hypertension,      Neuro/Psych PSYCHIATRIC DISORDERS Anxiety Depression    GI/Hepatic negative GI ROS,   Endo/Other    Renal/GU   negative genitourinary   Musculoskeletal   Abdominal   Peds  Hematology  (+) Blood dyscrasia, anemia ,   Anesthesia Other Findings Past Medical History: No date: Adjustment disorder with anxiety No date: Allergic rhinitis No date: Anemia No date: Anxiety No date: Cigarette smoker No date: Depression No date: Hypertension No date: Insomnia No date: Tobacco abuse  Past Surgical History: No date: NO PAST SURGERIES No date: WISDOM TOOTH EXTRACTION  BMI    Body Mass Index: 34.83 kg/m      Reproductive/Obstetrics (+) Pregnancy                             Anesthesia Physical Anesthesia Plan  ASA: II  Anesthesia Plan: Epidural   Post-op Pain Management:    Induction:   PONV Risk Score and Plan:   Airway Management Planned:   Additional Equipment:   Intra-op Plan:   Post-operative Plan:   Informed Consent: I have reviewed the patients History and Physical, chart, labs and discussed the procedure including the risks, benefits and alternatives for the proposed anesthesia with the patient or authorized representative who has indicated his/her understanding and acceptance.       Plan Discussed with: Anesthesiologist  Anesthesia Plan Comments:         Anesthesia Quick Evaluation

## 2020-01-17 MED ORDER — NIFEDIPINE ER OSMOTIC RELEASE 30 MG PO TB24
30.0000 mg | ORAL_TABLET | Freq: Every day | ORAL | Status: DC
  Administered 2020-01-17: 30 mg via ORAL
  Filled 2020-01-17: qty 1

## 2020-01-17 MED ORDER — ACETAMINOPHEN 325 MG PO TABS: 650.0000 mg | ORAL_TABLET | ORAL | 0 refills | Status: DC | PRN

## 2020-01-17 MED ORDER — NIFEDIPINE ER 30 MG PO TB24: 30.0000 mg | ORAL_TABLET | Freq: Every day | ORAL | 1 refills | Status: DC

## 2020-01-17 MED ORDER — COCONUT OIL OIL: 1.0000 "application " | TOPICAL_OIL | 0 refills | Status: DC | PRN

## 2020-01-17 MED ORDER — IBUPROFEN 600 MG PO TABS: 600.0000 mg | ORAL_TABLET | Freq: Four times a day (QID) | ORAL | 0 refills | Status: DC

## 2020-01-17 NOTE — Progress Notes (Signed)
Patient ID: Tamara Bishop, female   DOB: [birth year], 24 y.o.   MRN: [id number]   Post Partum Day # 1, s/p spontaneous vaginal birth, Rh positive, GBS negative, pre-eclampsia, iron deficiency anemia exacerbated by blood loss anemia, breastfeeding  Subjective:  Patient sitting in bed, nursing infant. Desires discharge home today and Nexplanon placement "as soon as possible".   Denies headache, blurred vision, difficulty breathing or respiratory distress, chest pain, abdominal or epigastric pain, excessive vaginal bleeding, dysuria, and leg pain or swelling.   Objective:  Temp:  [97.5 F (36.4 C)-98.7 F (37.1 C)] 98 F (36.7 C) (06/18 0741) Pulse Rate:  [61-76] 69 (06/18 0741) Resp:  [18] 18 (06/18 0741) BP: (127-146)/(81-98) 134/89 (06/18 0741) SpO2:  [99 %-100 %] 100 % (06/18 0741)  Physical Exam:   General: alert and cooperative   Lungs: clear to auscultation bilaterally  Breasts: normal appearance, no masses or tenderness  Heart: normal apical impulse  Abdomen: soft, non-tender; bowel sounds normal; no masses,  no organomegaly  Pelvis: Lochia: appropriate,  Uterine Fundus: firm  Extremities: DVT Evaluation: No evidence of DVT seen on physical exam.  Recent Labs    01/15/20 2114 01/16/20 0809  HGB 9.7* 8.9*  HCT 30.0* 27.9*    Assessment:  24 year old female, G2P2, Post Partum Day # 1, s/p spontaneous vaginal birth, Rh positive, GBS negative, pre-eclampsia, iron deficiency anemia exacerbated by blood loss anemia  Breastfeeding  Plan:  Rx Procardia, see orders.   Routine postpartum education and teaching.   Reviewed red flag symptoms and when to call.   Anticipated discharge later today.    LOS: 2 days   Gunnar Bulla, CNM [facility] 01/17/2020 8:33 AM

## 2020-01-17 NOTE — Lactation Note (Signed)
This note was copied from a baby's chart. Lactation Consultation Note  Patient Name: Tamara Bishop Date: 01/17/2020 Reason for consult: Follow-up assessment   Maternal Data Formula Feeding for Exclusion: No Has patient been taught Hand Expression?: Yes Does the patient have breastfeeding experience prior to this delivery?: Yes breastfed first child x 1 yr Feeding Feeding Type: Breast Fed baby breastfeeding when room entered, audible swallows present, mom states baby has been cluster feeding LATCH Score Latch: Grasps breast easily, tongue down, lips flanged, rhythmical sucking.  Audible Swallowing: Spontaneous and intermittent  Type of Nipple: Everted at rest and after stimulation  Comfort (Breast/Nipple): Soft / non-tender  Hold (Positioning): No assistance needed to correctly position infant at breast.  LATCH Score: 10  Interventions Interventions: Coconut oil  Lactation Tools Discussed/Used WIC Program: Yes  LC name and no written on white board Consult Status Consult Status: PRN    Dyann Kief 01/17/2020, 10:21 AM

## 2020-01-17 NOTE — Progress Notes (Signed)
Pt discharged with infant. Discharge instructions, prescriptions, and follow up appointments given to and reviewed with patient. Pt verbalized understanding. To be escorted out by auxillary.  °

## 2020-01-17 NOTE — Anesthesia Postprocedure Evaluation (Signed)
Anesthesia Post Note  Patient: Tamara Bishop  Procedure(s) Performed: AN AD HOC LABOR EPIDURAL  Patient location during evaluation: Mother Baby Anesthesia Type: Epidural Level of consciousness: awake and alert Pain management: pain level controlled Vital Signs Assessment: post-procedure vital signs reviewed and stable Respiratory status: spontaneous breathing, nonlabored ventilation and respiratory function stable Cardiovascular status: stable Postop Assessment: no headache, no backache, epidural receding and able to ambulate Anesthetic complications: no   No complications documented.   Last Vitals:  Vitals:   01/16/20 2300 01/17/20 0320  BP: 135/85 (!) 142/98  Pulse: 63 61  Resp: 18 18  Temp: 36.5 C 36.5 C  SpO2: 99% 100%    Last Pain:  Vitals:   01/17/20 0320  TempSrc: Oral  PainSc:                  Rosanne Gutting

## 2020-01-17 NOTE — Discharge Instructions (Signed)
Etonogestrel implant What is this medicine? ETONOGESTREL (et oh noe JES trel) is a contraceptive (birth control) device. It is used to prevent pregnancy. It can be used for up to 3 years. This medicine may be used for other purposes; ask your health care provider or pharmacist if you have questions. COMMON BRAND NAME(S): Implanon, Nexplanon What should I tell my health care provider before I take this medicine? They need to know if you have any of these conditions:  abnormal vaginal bleeding  blood vessel disease or blood clots  breast, cervical, endometrial, ovarian, liver, or uterine cancer  diabetes  gallbladder disease  heart disease or recent heart attack  high blood pressure  high cholesterol or triglycerides  kidney disease  liver disease  migraine headaches  seizures  stroke  tobacco smoker  an unusual or allergic reaction to etonogestrel, anesthetics or antiseptics, other medicines, foods, dyes, or preservatives  pregnant or trying to get pregnant  breast-feeding How should I use this medicine? This device is inserted just under the skin on the inner side of your upper arm by a health care professional. Talk to your pediatrician regarding the use of this medicine in children. Special care may be needed. Overdosage: If you think you have taken too much of this medicine contact a poison control center or emergency room at once. NOTE: This medicine is only for you. Do not share this medicine with others. What if I miss a dose? This does not apply. What may interact with this medicine? Do not take this medicine with any of the following medications:  amprenavir  fosamprenavir This medicine may also interact with the following medications:  acitretin  aprepitant  armodafinil  bexarotene  bosentan  carbamazepine  certain medicines for fungal infections like fluconazole, ketoconazole, itraconazole and voriconazole  certain medicines to treat  hepatitis, HIV or AIDS  cyclosporine  felbamate  griseofulvin  lamotrigine  modafinil  oxcarbazepine  phenobarbital  phenytoin  primidone  rifabutin  rifampin  rifapentine  St. John's wort  topiramate This list may not describe all possible interactions. Give your health care provider a list of all the medicines, herbs, non-prescription drugs, or dietary supplements you use. Also tell them if you smoke, drink alcohol, or use illegal drugs. Some items may interact with your medicine. What should I watch for while using this medicine? This product does not protect you against HIV infection (AIDS) or other sexually transmitted diseases. You should be able to feel the implant by pressing your fingertips over the skin where it was inserted. Contact your doctor if you cannot feel the implant, and use a non-hormonal birth control method (such as condoms) until your doctor confirms that the implant is in place. Contact your doctor if you think that the implant may have broken or become bent while in your arm. You will receive a user card from your health care provider after the implant is inserted. The card is a record of the location of the implant in your upper arm and when it should be removed. Keep this card with your health records. What side effects may I notice from receiving this medicine? Side effects that you should report to your doctor or health care professional as soon as possible:  allergic reactions like skin rash, itching or hives, swelling of the face, lips, or tongue  breast lumps, breast tissue changes, or discharge  breathing problems  changes in emotions or moods  coughing up blood  if you feel that the implant   may have broken or bent while in your arm  high blood pressure  pain, irritation, swelling, or bruising at the insertion site  scar at site of insertion  signs of infection at the insertion site such as fever, and skin redness, pain or  discharge  signs and symptoms of a blood clot such as breathing problems; changes in vision; chest pain; severe, sudden headache; pain, swelling, warmth in the leg; trouble speaking; sudden numbness or weakness of the face, arm or leg  signs and symptoms of liver injury like dark yellow or brown urine; general ill feeling or flu-like symptoms; light-colored stools; loss of appetite; nausea; right upper belly pain; unusually weak or tired; yellowing of the eyes or skin  unusual vaginal bleeding, discharge Side effects that usually do not require medical attention (report to your doctor or health care professional if they continue or are bothersome):  acne  breast pain or tenderness  headache  irregular menstrual bleeding  nausea This list may not describe all possible side effects. Call your doctor for medical advice about side effects. You may report side effects to FDA at 1-800-FDA-1088. Where should I keep my medicine? This drug is given in a hospital or clinic and will not be stored at home. NOTE: This sheet is a summary. It may not cover all possible information. If you have questions about this medicine, talk to your doctor, pharmacist, or health care provider.  2020 Elsevier/Gold Standard (2019-04-30 11:33:04)   Postpartum Care After Vaginal Delivery This sheet gives you information about how to care for yourself from the time you deliver your baby to up to 6-12 weeks after delivery (postpartum period). Your health care provider may also give you more specific instructions. If you have problems or questions, contact your health care provider. Follow these instructions at home: Vaginal bleeding  It is normal to have vaginal bleeding (lochia) after delivery. Wear a sanitary pad for vaginal bleeding and discharge. ? During the first week after delivery, the amount and appearance of lochia is often similar to a menstrual period. ? Over the next few weeks, it will gradually decrease  to a dry, yellow-brown discharge. ? For most women, lochia stops completely by 4-6 weeks after delivery. Vaginal bleeding can vary from woman to woman.  Change your sanitary pads frequently. Watch for any changes in your flow, such as: ? A sudden increase in volume. ? A change in color. ? Large blood clots.  If you pass a blood clot from your vagina, save it and call your health care provider to discuss. Do not flush blood clots down the toilet before talking with your health care provider.  Do not use tampons or douches until your health care provider says this is safe.  If you are not breastfeeding, your period should return 6-8 weeks after delivery. If you are feeding your child breast milk only (exclusive breastfeeding), your period may not return until you stop breastfeeding. Perineal care  Keep the area between the vagina and the anus (perineum) clean and dry as told by your health care provider. Use medicated pads and pain-relieving sprays and creams as directed.  If you had a cut in the perineum (episiotomy) or a tear in the vagina, check the area for signs of infection until you are healed. Check for: ? More redness, swelling, or pain. ? Fluid or blood coming from the cut or tear. ? Warmth. ? Pus or a bad smell.  You may be given a squirt bottle to  use instead of wiping to clean the perineum area after you go to the bathroom. As you start healing, you may use the squirt bottle before wiping yourself. Make sure to wipe gently.  To relieve pain caused by an episiotomy, a tear in the vagina, or swollen veins in the anus (hemorrhoids), try taking a warm sitz bath 2-3 times a day. A sitz bath is a warm water bath that is taken while you are sitting down. The water should only come up to your hips and should cover your buttocks. Breast care  Within the first few days after delivery, your breasts may feel heavy, full, and uncomfortable (breast engorgement). Milk may also leak from your  breasts. Your health care provider can suggest ways to help relieve the discomfort. Breast engorgement should go away within a few days.  If you are breastfeeding: ? Wear a bra that supports your breasts and fits you well. ? Keep your nipples clean and dry. Apply creams and ointments as told by your health care provider. ? You may need to use breast pads to absorb milk that leaks from your breasts. ? You may have uterine contractions every time you breastfeed for up to several weeks after delivery. Uterine contractions help your uterus return to its normal size. ? If you have any problems with breastfeeding, work with your health care provider or Advertising copywriter.  If you are not breastfeeding: ? Avoid touching your breasts a lot. Doing this can make your breasts produce more milk. ? Wear a good-fitting bra and use cold packs to help with swelling. ? Do not squeeze out (express) milk. This causes you to make more milk. Intimacy and sexuality  Ask your health care provider when you can engage in sexual activity. This may depend on: ? Your risk of infection. ? How fast you are healing. ? Your comfort and desire to engage in sexual activity.  You are able to get pregnant after delivery, even if you have not had your period. If desired, talk with your health care provider about methods of birth control (contraception). Medicines  Take over-the-counter and prescription medicines only as told by your health care provider.  If you were prescribed an antibiotic medicine, take it as told by your health care provider. Do not stop taking the antibiotic even if you start to feel better. Activity  Gradually return to your normal activities as told by your health care provider. Ask your health care provider what activities are safe for you.  Rest as much as possible. Try to rest or take a nap while your baby is sleeping. Eating and drinking   Drink enough fluid to keep your urine pale  yellow.  Eat high-fiber foods every day. These may help prevent or relieve constipation. High-fiber foods include: ? Whole grain cereals and breads. ? Brown rice. ? Beans. ? Fresh fruits and vegetables.  Do not try to lose weight quickly by cutting back on calories.  Take your prenatal vitamins until your postpartum checkup or until your health care provider tells you it is okay to stop. Lifestyle  Do not use any products that contain nicotine or tobacco, such as cigarettes and e-cigarettes. If you need help quitting, ask your health care provider.  Do not drink alcohol, especially if you are breastfeeding. General instructions  Keep all follow-up visits for you and your baby as told by your health care provider. Most women visit their health care provider for a postpartum checkup within the first 3-6  weeks after delivery. Contact a health care provider if:  You feel unable to cope with the changes that your child brings to your life, and these feelings do not go away.  You feel unusually sad or worried.  Your breasts become red, painful, or hard.  You have a fever.  You have trouble holding urine or keeping urine from leaking.  You have little or no interest in activities you used to enjoy.  You have not breastfed at all and you have not had a menstrual period for 12 weeks after delivery.  You have stopped breastfeeding and you have not had a menstrual period for 12 weeks after you stopped breastfeeding.  You have questions about caring for yourself or your baby.  You pass a blood clot from your vagina. Get help right away if:  You have chest pain.  You have difficulty breathing.  You have sudden, severe leg pain.  You have severe pain or cramping in your lower abdomen.  You bleed from your vagina so much that you fill more than one sanitary pad in one hour. Bleeding should not be heavier than your heaviest period.  You develop a severe headache.  You  faint.  You have blurred vision or spots in your vision.  You have bad-smelling vaginal discharge.  You have thoughts about hurting yourself or your baby. If you ever feel like you may hurt yourself or others, or have thoughts about taking your own life, get help right away. You can go to the nearest emergency department or call:  Your local emergency services (911 in the [place]  A suicide crisis helpline, such as the National Suicide Prevention Lifeline at 585 286 9495. This is open 24 hours a day. Summary  The period of time right after you deliver your newborn up to 6-12 weeks after delivery is called the postpartum period.  Gradually return to your normal activities as told by your health care provider.  Keep all follow-up visits for you and your baby as told by your health care provider. This information is not intended to replace advice given to you by your health care provider. Make sure you discuss any questions you have with your health care provider. Document Revised: 07/21/2017 Document Reviewed: 05/01/2017 Elsevier Patient Education  2020 [employer].   Postpartum Baby Blues The postpartum period begins right after the birth of a baby. During this time, there is often a lot of joy and excitement. It is also a time of many changes in the life of the parents. No matter how many times a mother gives birth, each child brings new challenges to the family, including different ways of relating to one another. It is common to have feelings of excitement along with confusing changes in moods, emotions, and thoughts. You may feel happy one minute and sad or stressed the next. These feelings of sadness usually happen in the period right after you have your baby, and they go away within a week or two. This is called the "baby blues." What are the causes? There is no known cause of baby blues. It is likely caused by a combination of factors. However, changes in hormone levels after  childbirth are believed to trigger some of the symptoms. Other factors that can play a role in these mood changes include:  Lack of sleep.  Stressful life events, such as poverty, caring for a loved one, or death of a loved one.  Genetics. What are the signs or symptoms? Symptoms of this  condition include:  Brief changes in mood, such as going from extreme happiness to sadness.  Decreased concentration.  Difficulty sleeping.  Crying spells and tearfulness.  Loss of appetite.  Irritability.  Anxiety. If the symptoms of baby blues last for more than 2 weeks or become more severe, you may have postpartum depression. How is this diagnosed? This condition is diagnosed based on an evaluation of your symptoms. There are no medical or lab tests that lead to a diagnosis, but there are various questionnaires that a health care provider may use to identify women with the baby blues or postpartum depression. How is this treated? Treatment is not needed for this condition. The baby blues usually go away on their own in 1-2 weeks. Social support is often all that is needed. You will be encouraged to get adequate sleep and rest. Follow these instructions at home: Lifestyle      Get as much rest as you can. Take a nap when the baby sleeps.  Exercise regularly as told by your health care provider. Some women find yoga and walking to be helpful.  Eat a balanced and nourishing diet. This includes plenty of fruits and vegetables, whole grains, and lean proteins.  Do little things that you enjoy. Have a cup of tea, take a bubble bath, read your favorite magazine, or listen to your favorite music.  Avoid alcohol.  Ask for help with household chores, cooking, grocery shopping, or running errands. Do not try to do everything yourself. Consider hiring a postpartum doula to help. This is a professional who specializes in providing support to new mothers.  Try not to make any major life changes  during pregnancy or right after giving birth. This can add stress. General instructions  Talk to people close to you about how you are feeling. Get support from your partner, family members, friends, or other new moms. You may want to join a support group.  Find ways to cope with stress. This may include: ? Writing your thoughts and feelings in a journal. ? Spending time outside. ? Spending time with people who make you laugh.  Try to stay positive in how you think. Think about the things you are grateful for.  Take over-the-counter and prescription medicines only as told by your health care provider.  Let your health care provider know if you have any concerns.  Keep all postpartum visits as told by your health care provider. This is important. Contact a health care provider if:  Your baby blues do not go away after 2 weeks. Get help right away if:  You have thoughts of taking your own life (suicidal thoughts).  You think you may harm the baby or other people.  You see or hear things that are not there (hallucinations). Summary  After giving birth, you may feel happy one minute and sad or stressed the next. Feelings of sadness that happen right after the baby is born and go away after a week or two are called the "baby blues."  You can manage the baby blues by getting enough rest, eating a healthy diet, exercising, spending time with supportive people, and finding ways to cope with stress.  If feelings of sadness and stress last longer than 2 weeks or get in the way of caring for your baby, talk to your health care provider. This may mean you have postpartum depression. This information is not intended to replace advice given to you by your health care provider. Make sure you discuss  any questions you have with your health care provider. Document Revised: 11/09/2018 Document Reviewed: 09/13/2016 Elsevier Patient Education  2020 [employer]   Breastfeeding  Choosing to  breastfeed is one of the best decisions you can make for yourself and your baby. A change in hormones during pregnancy causes your breasts to make breast milk in your milk-producing glands. Hormones prevent breast milk from being released before your baby is born. They also prompt milk flow after birth. Once breastfeeding has begun, thoughts of your baby, as well as his or her sucking or crying, can stimulate the release of milk from your milk-producing glands. Benefits of breastfeeding Research shows that breastfeeding offers many health benefits for infants and mothers. It also offers a cost-free and convenient way to feed your baby. For your baby  Your first milk (colostrum) helps your baby's digestive system to function better.  Special cells in your milk (antibodies) help your baby to fight off infections.  Breastfed babies are less likely to develop asthma, allergies, obesity, or type 2 diabetes. They are also at lower risk for sudden infant death syndrome (SIDS).  Nutrients in breast milk are better able to meet your baby's needs compared to infant formula.  Breast milk improves your baby's brain development. For you  Breastfeeding helps to create a very special bond between you and your baby.  Breastfeeding is convenient. Breast milk costs nothing and is always available at the correct temperature.  Breastfeeding helps to burn calories. It helps you to lose the weight that you gained during pregnancy.  Breastfeeding makes your uterus return faster to its size before pregnancy. It also slows bleeding (lochia) after you give birth.  Breastfeeding helps to lower your risk of developing type 2 diabetes, osteoporosis, rheumatoid arthritis, cardiovascular disease, and breast, ovarian, uterine, and endometrial cancer later in life. Breastfeeding basics Starting breastfeeding  Find a comfortable place to sit or lie down, with your neck and back well-supported.  Place a pillow or a  rolled-up blanket under your baby to bring him or her to the level of your breast (if you are seated). Nursing pillows are specially designed to help support your arms and your baby while you breastfeed.  Make sure that your baby's tummy (abdomen) is facing your abdomen.  Gently massage your breast. With your fingertips, massage from the outer edges of your breast inward toward the nipple. This encourages milk flow. If your milk flows slowly, you may need to continue this action during the feeding.  Support your breast with 4 fingers underneath and your thumb above your nipple (make the letter "C" with your hand). Make sure your fingers are well away from your nipple and your baby's mouth.  Stroke your baby's lips gently with your finger or nipple.  When your baby's mouth is open wide enough, quickly bring your baby to your breast, placing your entire nipple and as much of the areola as possible into your baby's mouth. The areola is the colored area around your nipple. ? More areola should be visible above your baby's upper lip than below the lower lip. ? Your baby's lips should be opened and extended outward (flanged) to ensure an adequate, comfortable latch. ? Your baby's tongue should be between his or her lower gum and your breast.  Make sure that your baby's mouth is correctly positioned around your nipple (latched). Your baby's lips should create a seal on your breast and be turned out (everted).  It is common for your baby to  suck about 2-3 minutes in order to start the flow of breast milk. Latching Teaching your baby how to latch onto your breast properly is very important. An improper latch can cause nipple pain, decreased milk supply, and poor weight gain in your baby. Also, if your baby is not latched onto your nipple properly, he or she may swallow some air during feeding. This can make your baby fussy. Burping your baby when you switch breasts during the feeding can help to get rid of  the air. However, teaching your baby to latch on properly is still the best way to prevent fussiness from swallowing air while breastfeeding. Signs that your baby has successfully latched onto your nipple  Silent tugging or silent sucking, without causing you pain. Infant's lips should be extended outward (flanged).  Swallowing heard between every 3-4 sucks once your milk has started to flow (after your let-down milk reflex occurs).  Muscle movement above and in front of his or her ears while sucking. Signs that your baby has not successfully latched onto your nipple  Sucking sounds or smacking sounds from your baby while breastfeeding.  Nipple pain. If you think your baby has not latched on correctly, slip your finger into the corner of your baby's mouth to break the suction and place it between your baby's gums. Attempt to start breastfeeding again. Signs of successful breastfeeding Signs from your baby  Your baby will gradually decrease the number of sucks or will completely stop sucking.  Your baby will fall asleep.  Your baby's body will relax.  Your baby will retain a small amount of milk in his or her mouth.  Your baby will let go of your breast by himself or herself. Signs from you  Breasts that have increased in firmness, weight, and size 1-3 hours after feeding.  Breasts that are softer immediately after breastfeeding.  Increased milk volume, as well as a change in milk consistency and color by the fifth day of breastfeeding.  Nipples that are not sore, cracked, or bleeding. Signs that your baby is getting enough milk  Wetting at least 1-2 diapers during the first 24 hours after birth.  Wetting at least 5-6 diapers every 24 hours for the first week after birth. The urine should be clear or pale yellow by the age of 5 days.  Wetting 6-8 diapers every 24 hours as your baby continues to grow and develop.  At least 3 stools in a 24-hour period by the age of 5 days.  The stool should be soft and yellow.  At least 3 stools in a 24-hour period by the age of 7 days. The stool should be seedy and yellow.  No loss of weight greater than 10% of birth weight during the first 3 days of life.  Average weight gain of 4-7 oz (113-198 g) per week after the age of 4 days.  Consistent daily weight gain by the age of 5 days, without weight loss after the age of 2 weeks. After a feeding, your baby may spit up a small amount of milk. This is normal. Breastfeeding frequency and duration Frequent feeding will help you make more milk and can prevent sore nipples and extremely full breasts (breast engorgement). Breastfeed when you feel the need to reduce the fullness of your breasts or when your baby shows signs of hunger. This is called "breastfeeding on demand." Signs that your baby is hungry include:  Increased alertness, activity, or restlessness.  Movement of the head from side  to side.  Opening of the mouth when the corner of the mouth or cheek is stroked (rooting).  Increased sucking sounds, smacking lips, cooing, sighing, or squeaking.  Hand-to-mouth movements and sucking on fingers or hands.  Fussing or crying. Avoid introducing a pacifier to your baby in the first 4-6 weeks after your baby is born. After this time, you may choose to use a pacifier. Research has shown that pacifier use during the first year of a baby's life decreases the risk of sudden infant death syndrome (SIDS). Allow your baby to feed on each breast as long as he or she wants. When your baby unlatches or falls asleep while feeding from the first breast, offer the second breast. Because newborns are often sleepy in the first few weeks of life, you may need to awaken your baby to get him or her to feed. Breastfeeding times will vary from baby to baby. However, the following rules can serve as a guide to help you make sure that your baby is properly fed:  Newborns (babies 100 weeks of age or  younger) may breastfeed every 1-3 hours.  Newborns should not go without breastfeeding for longer than 3 hours during the day or 5 hours during the night.  You should breastfeed your baby a minimum of 8 times in a 24-hour period. Breast milk pumping     Pumping and storing breast milk allows you to make sure that your baby is exclusively fed your breast milk, even at times when you are unable to breastfeed. This is especially important if you go back to work while you are still breastfeeding, or if you are not able to be present during feedings. Your lactation consultant can help you find a method of pumping that works best for you and give you guidelines about how long it is safe to store breast milk. Caring for your breasts while you breastfeed Nipples can become dry, cracked, and sore while breastfeeding. The following recommendations can help keep your breasts moisturized and healthy:  Avoid using soap on your nipples.  Wear a supportive bra designed especially for nursing. Avoid wearing underwire-style bras or extremely tight bras (sports bras).  Air-dry your nipples for 3-4 minutes after each feeding.  Use only cotton bra pads to absorb leaked breast milk. Leaking of breast milk between feedings is normal.  Use lanolin on your nipples after breastfeeding. Lanolin helps to maintain your skin's normal moisture barrier. Pure lanolin is not harmful (not toxic) to your baby. You may also hand express a few drops of breast milk and gently massage that milk into your nipples and allow the milk to air-dry. In the first few weeks after giving birth, some women experience breast engorgement. Engorgement can make your breasts feel heavy, warm, and tender to the touch. Engorgement peaks within 3-5 days after you give birth. The following recommendations can help to ease engorgement:  Completely empty your breasts while breastfeeding or pumping. You may want to start by applying warm, moist heat (in  the shower or with warm, water-soaked hand towels) just before feeding or pumping. This increases circulation and helps the milk flow. If your baby does not completely empty your breasts while breastfeeding, pump any extra milk after he or she is finished.  Apply ice packs to your breasts immediately after breastfeeding or pumping, unless this is too uncomfortable for you. To do this: ? Put ice in a plastic bag. ? Place a towel between your skin and the bag. ? Leave  the ice on for 20 minutes, 2-3 times a day.  Make sure that your baby is latched on and positioned properly while breastfeeding. If engorgement persists after 48 hours of following these recommendations, contact your health care provider or a Science writer. Overall health care recommendations while breastfeeding  Eat 3 healthy meals and 3 snacks every day. Well-nourished mothers who are breastfeeding need an additional 450-500 calories a day. You can meet this requirement by increasing the amount of a balanced diet that you eat.  Drink enough water to keep your urine pale yellow or clear.  Rest often, relax, and continue to take your prenatal vitamins to prevent fatigue, stress, and low vitamin and mineral levels in your body (nutrient deficiencies).  Do not use any products that contain nicotine or tobacco, such as cigarettes and e-cigarettes. Your baby may be harmed by chemicals from cigarettes that pass into breast milk and exposure to secondhand smoke. If you need help quitting, ask your health care provider.  Avoid alcohol.  Do not use illegal drugs or marijuana.  Talk with your health care provider before taking any medicines. These include over-the-counter and prescription medicines as well as vitamins and herbal supplements. Some medicines that may be harmful to your baby can pass through breast milk.  It is possible to become pregnant while breastfeeding. If birth control is desired, ask your health care provider  about options that will be safe while breastfeeding your baby. Where to find more information: [employer]: www.llli.org Contact a health care provider if:  You feel like you want to stop breastfeeding or have become frustrated with breastfeeding.  Your nipples are cracked or bleeding.  Your breasts are red, tender, or warm.  You have: ? Painful breasts or nipples. ? A swollen area on either breast. ? A fever or chills. ? Nausea or vomiting. ? Drainage other than breast milk from your nipples.  Your breasts do not become full before feedings by the fifth day after you give birth.  You feel sad and depressed.  Your baby is: ? Too sleepy to eat well. ? Having trouble sleeping. ? More than 75 week old and wetting fewer than 6 diapers in a 24-hour period. ? Not gaining weight by 54 days of age.  Your baby has fewer than 3 stools in a 24-hour period.  Your baby's skin or the white parts of his or her eyes become yellow. Get help right away if:  Your baby is overly tired (lethargic) and does not want to wake up and feed.  Your baby develops an unexplained fever. Summary  Breastfeeding offers many health benefits for infant and mothers.  Try to breastfeed your infant when he or she shows early signs of hunger.  Gently tickle or stroke your baby's lips with your finger or nipple to allow the baby to open his or her mouth. Bring the baby to your breast. Make sure that much of the areola is in your baby's mouth. Offer one side and burp the baby before you offer the other side.  Talk with your health care provider or lactation consultant if you have questions or you face problems as you breastfeed. This information is not intended to replace advice given to you by your health care provider. Make sure you discuss any questions you have with your health care provider. Document Revised: 10/12/2017 Document Reviewed: 08/19/2016 Elsevier Patient Education  [place].   Nifedipine Extended-Release Oral Tablets What is this medicine? NIFEDIPINE (  nye FED i peen) is a calcium channel blocker. It relaxes your blood vessels and decreases the amount of work the heart has to do. It treats high blood pressure and/or prevents chest pain (also called angina). This medicine may be used for other purposes; ask your health care provider or pharmacist if you have questions. COMMON BRAND NAME(S): Adalat CC, Afeditab CR, Nifediac CC, Nifedical XL, Procardia XL What should I tell my health care provider before I take this medicine? They need to know if you have any of these conditions:  blockage in your bowels  constipation  heart attack  heart disease  heart failure  liver disease  low blood pressure  an unusual or allergic reaction to nifedipine, other drugs, foods, dyes or preservatives  pregnant or trying to get pregnant  breast-feeding How should I use this medicine? Take this drug by mouth. Take it as directed on the prescription label at the same time every day. Do not cut, crush or chew this drug. Swallow the tablets whole. Some tablets need to be taken on an empty stomach. Ask your pharmacist or health care provider if you have any questions. Keep taking it unless your health care provider tells you to stop. Do not take this drug with grapefruit juice. Talk to your health care provider about the use of this drug in children. Special care may be needed. Overdosage: If you think you have taken too much of this medicine contact a poison control center or emergency room at once. NOTE: This medicine is only for you. Do not share this medicine with others. What if I miss a dose? If you miss a dose, take it as soon as you can. If it is almost time for your next dose, take only that dose. Do not take double or extra doses. What may interact with this medicine? Do not take this medicine with any of the following medications:  certain  medicines for seizures like carbamazepine, phenobarbital, phenytoin  lumacaftor; ivacaftor  rifabutin  rifampin  rifapentine  St. John's Wort This medicine may also interact with the following medications:  antiviral medicines for HIV or AIDS  certain medicines for blood pressure  certain medicines for diabetes  certain medicines for erectile dysfunction  certain medicines for fungal infections like ketoconazole, fluconazole, and itraconazole  certain medicines for irregular heart beat like flecainide and quinidine  certain medicines that treat or prevent blood clots like warfarin  clarithromycin  digoxin  dolasetron  erythromycin  fluoxetine  grapefruit juice  local or general anesthetics  nefazodone  orlistat  quinupristin; dalfopristin  sirolimus  stomach acid blockers like cimetidine, ranitidine, omeprazole, or pantoprazole  tacrolimus  valproic acid This list may not describe all possible interactions. Give your health care provider a list of all the medicines, herbs, non-prescription drugs, or dietary supplements you use. Also tell them if you smoke, drink alcohol, or use illegal drugs. Some items may interact with your medicine. What should I watch for while using this medicine? Visit your health care provider for regular checks on your progress. Check your blood pressure as directed. Ask your health care provider what your blood pressure should be. Also, find out when you should contact him or her. Do not treat yourself for coughs, colds, or pain while you are using this drug without asking your health care provider for advice. Some drugs may increase your blood pressure. You may get drowsy or dizzy. Do not drive, use machinery, or do anything that needs mental alertness  until you know how this drug affects you. Do not stand up or sit up quickly, especially if you are an older patient. This reduces the risk of dizzy or fainting spells. The tablet  shell for some brands of this drug does not dissolve. This is normal. The tablet shell may appear whole in the stool. This is not a cause for concern. What side effects may I notice from receiving this medicine? Side effects that you should report to your doctor or health care provider as soon as possible:  allergic reactions (skin rash, itching or hives; swelling of the face, lips, or tongue)  heart attack (trouble breathing; pain or tightness in the chest, neck, back or arms; unusually weak or tired)  heart failure (trouble breathing; fast, irregular heartbeat; sudden weight gain; swelling of the ankles, feet, hands; unusually weak or tired)  low blood pressure (dizziness; feeling faint or lightheaded, falls; unusually weak or tired) Side effects that usually do not require medical attention (report to your doctor or health care provider if they continue or are bothersome):  bloating  changes in emotions or moods  constipation  facial flushing  headache  nasal congestion (like runny or stuffy nose)  nausea  stomach pain This list may not describe all possible side effects. Call your doctor for medical advice about side effects. You may report side effects to FDA at 1-800-FDA-1088. Where should I keep my medicine? Keep out of the reach of children and pets. Store at room temperature between 20 and 25 degrees C (68 and 77 degrees F). Protect from light and moisture. Keep the container tightly closed. Throw away any unused drug after the expiration date. NOTE: This sheet is a summary. It may not cover all possible information. If you have questions about this medicine, talk to your doctor, pharmacist, or health care provider.  2020 Elsevier/Gold Standard (2019-05-14 08:24:11)    Postpartum Hypertension Postpartum hypertension is high blood pressure that remains higher than normal after childbirth. You may not realize that you have postpartum hypertension if your blood pressure  is not being checked regularly. In most cases, postpartum hypertension will go away on its own, usually within a week of delivery. However, for some women, medical treatment is required to prevent serious complications, such as seizures or stroke. What are the causes? This condition may be caused by one or more of the following:  Hypertension that existed before pregnancy (chronic hypertension).  Hypertension that comes on as a result of pregnancy (gestational hypertension).  Hypertensive disorders during pregnancy (preeclampsia) or seizures in women who have high blood pressure during pregnancy (eclampsia).  A condition in which the liver, platelets, and red blood cells are damaged during pregnancy (HELLP syndrome).  A condition in which the thyroid produces too much hormones (hyperthyroidism).  Other rare problems of the nerves (neurological disorders) or blood disorders. In some cases, the cause may not be known. What increases the risk? The following factors may make you more likely to develop this condition:  Chronic hypertension. In some cases, this may not have been diagnosed before pregnancy.  Obesity.  Type 2 diabetes.  Kidney disease.  History of preeclampsia or eclampsia.  Other medical conditions that change the level of hormones in the body (hormonal imbalance). What are the signs or symptoms? As with all types of hypertension, postpartum hypertension may not have any symptoms. Depending on how high your blood pressure is, you may experience:  Headaches. These may be mild, moderate, or severe. They may also  be steady, constant, or sudden in onset (thunderclap headache).  Changes in your ability to see (visual changes).  Dizziness.  Shortness of breath.  Swelling of your hands, feet, lower legs, or face. In some cases, you may have swelling in more than one of these locations.  Heart palpitations or a racing heartbeat.  Difficulty breathing while lying  down.  Decrease in the amount of urine that you pass. Other rare signs and symptoms may include:  Sweating more than usual. This lasts longer than a few days after delivery.  Chest pain.  Sudden dizziness when you get up from sitting or lying down.  Seizures.  Nausea or vomiting.  Abdominal pain. How is this diagnosed? This condition may be diagnosed based on the results of a physical exam, blood pressure measurements, and blood and urine tests. You may also have other tests, such as a CT scan or an MRI, to check for other problems of postpartum hypertension. How is this treated? If blood pressure is high enough to require treatment, your options may include:  Medicines to reduce blood pressure (antihypertensives). Tell your health care provider if you are breastfeeding or if you plan to breastfeed. There are many antihypertensive medicines that are safe to take while breastfeeding.  Stopping medicines that may be causing hypertension.  Treating medical conditions that are causing hypertension.  Treating the complications of hypertension, such as seizures, stroke, or kidney problems. Your health care provider will also continue to monitor your blood pressure closely until it is within a safe range for you. Follow these instructions at home:  Take over-the-counter and prescription medicines only as told by your health care provider.  Return to your normal activities as told by your health care provider. Ask your health care provider what activities are safe for you.  Do not use any products that contain nicotine or tobacco, such as cigarettes and e-cigarettes. If you need help quitting, ask your health care provider.  Keep all follow-up visits as told by your health care provider. This is important. Contact a health care provider if:  Your symptoms get worse.  You have new symptoms, such as: ? A headache that does not get better. ? Dizziness. ? Visual changes. Get help  right away if:  You suddenly develop swelling in your hands, ankles, or face.  You have sudden, rapid weight gain.  You develop difficulty breathing, chest pain, racing heartbeat, or heart palpitations.  You develop severe pain in your abdomen.  You have any symptoms of a stroke. "BE FAST" is an easy way to remember the main warning signs of a stroke: ? B - Balance. Signs are dizziness, sudden trouble walking, or loss of balance. ? E - Eyes. Signs are trouble seeing or a sudden change in vision. ? F - Face. Signs are sudden weakness or numbness of the face, or the face or eyelid drooping on one side. ? A - Arms. Signs are weakness or numbness in an arm. This happens suddenly and usually on one side of the body. ? S - Speech. Signs are sudden trouble speaking, slurred speech, or trouble understanding what people say. ? T - Time. Time to call emergency services. Write down what time symptoms started.  You have other signs of a stroke, such as: ? A sudden, severe headache with no known cause. ? Nausea or vomiting. ? Seizure. These symptoms may represent a serious problem that is an emergency. Do not wait to see if the symptoms will go away.  Get medical help right away. Call your local emergency services (911 in the [place] Do not drive yourself to the hospital. Summary  Postpartum hypertension is high blood pressure that remains higher than normal after childbirth.  In most cases, postpartum hypertension will go away on its own, usually within a week of delivery.  For some women, medical treatment is required to prevent serious complications, such as seizures or stroke. This information is not intended to replace advice given to you by your health care provider. Make sure you discuss any questions you have with your health care provider. Document Revised: 08/24/2018 Document Reviewed: 05/08/2017 Elsevier Patient Education  2020 [employer].

## 2020-01-20 ENCOUNTER — Ambulatory Visit (INDEPENDENT_AMBULATORY_CARE_PROVIDER_SITE_OTHER): Payer: Medicaid Other | Admitting: Certified Nurse Midwife

## 2020-01-20 ENCOUNTER — Other Ambulatory Visit: Payer: Self-pay

## 2020-01-20 ENCOUNTER — Encounter: Payer: Self-pay | Admitting: Certified Nurse Midwife

## 2020-01-20 ENCOUNTER — Encounter: Payer: Medicaid Other | Admitting: Certified Nurse Midwife

## 2020-01-20 VITALS — BP 140/90 | HR 89 | Ht 61.5 in | Wt 169.4 lb

## 2020-01-20 DIAGNOSIS — Z8759 Personal history of other complications of pregnancy, childbirth and the puerperium: Secondary | ICD-10-CM

## 2020-01-20 DIAGNOSIS — O165 Unspecified maternal hypertension, complicating the puerperium: Secondary | ICD-10-CM

## 2020-01-20 NOTE — Progress Notes (Signed)
GYN ENCOUNTER NOTE  Subjective:       Tamara Bishop is a 24 y.o. [phone] female is here for blood pressure check.   Taking procardia 30 mg daily. Has checked blood pressure at home sporadically.   Boyfriend is sick and has "no help and does not have the time" for self care. Family members are currently on vacation.   Feeling weakness in her pelvis and reports "urine just comes out when sitting down to use the restroom."  Denies difficulty breathing or respiratory distress, chest pain, abdominal pain, excessive vaginal bleeding, dysuria, leg pain or swelling   Gynecologic History  No LMP recorded. (Menstrual status: Lactating).   Contraception: Lactating mother, recent pregnancy   Last Pap: 04/16/19. Results were: Negative  Obstetric History  OB History  Gravida Para Term Preterm AB Living  2 2 2  0 0 2  SAB TAB Ectopic Multiple Live Births  0 0 0 0 2    # Outcome Date GA Lbr Len/2nd Weight Sex Delivery Anes PTL Lv  2 Term 01/16/20 [redacted]w[redacted]d/ 00:40 7 lb 7.9 oz (3.4 kg) M Vag-Spont EPI  LIV  1 Term 10/17/18 328w1d 00:46 6 lb 8.8 oz (2.97 kg) F Vag-Spont EPI  LIV     Complications: Preeclampsia    Past Medical History:  Diagnosis Date  . Adjustment disorder with anxiety   . Allergic rhinitis   . Anemia   . Anxiety   . Cigarette smoker   . Depression   . Hypertension   . Insomnia   . Tobacco abuse     Past Surgical History:  Procedure Laterality Date  . NO PAST SURGERIES    . WISDOM TOOTH EXTRACTION      Current Outpatient Medications on File Prior to Visit  Medication Sig Dispense Refill  . acetaminophen (TYLENOL) 325 MG tablet Take 2 tablets (650 mg total) by mouth every 4 (four) hours as needed (for pain scale < 4). 60 tablet 0  . coconut oil OIL Apply 1 application topically as needed. 120 mL 0  . Iron-FA-B Cmp-C-Biot-Probiotic (FUSION PLUS) CAPS Take 1 tablet by mouth daily. 30 capsule 6  . NIFEdipine (ADALAT CC) 30 MG 24 hr tablet Take 1 tablet (30 mg  total) by mouth daily. 30 tablet 1  . Prenatal Vit-Fe Fumarate-FA (MULTIVITAMIN-PRENATAL) 27-0.8 MG TABS tablet Take 1 tablet by mouth daily at 12 noon.    . Marland Kitchenbuprofen (ADVIL) 600 MG tablet Take 1 tablet (600 mg total) by mouth every 6 (six) hours. (Patient not taking: Reported on 01/20/2020) 30 tablet 0   No current facility-administered medications on file prior to visit.    Allergies  Allergen Reactions  . Azithromycin Other (See Comments)    Felt like her stomach was going to come out of her body    Social History   Socioeconomic History  . Marital status: Significant Other    Spouse name: AaQuentin Ore. Number of children: Not on file  . Years of education: Not on file  . Highest education level: Not on file  Occupational History  . Not on file  Tobacco Use  . Smoking status: Former Smoker    Packs/day: 0.50    Years: 1.00    Pack years: 0.50    Types: Cigarettes  . Smokeless tobacco: Never Used  Vaping Use  . Vaping Use: Never used  Substance and Sexual Activity  . Alcohol use: No  . Drug use: No  . Sexual activity: Yes  Birth control/protection: Other-see comments, Inserts    Comment: nexplanon  Other Topics Concern  . Not on file  Social History Narrative  . Not on file   Social Determinants of Health   Financial Resource Strain:   . Difficulty of Paying Living Expenses:   Food Insecurity:   . Worried About Charity fundraiser in the Last Year:   . Arboriculturist in the Last Year:   Transportation Needs:   . Film/video editor (Medical):   Marland Kitchen Lack of Transportation (Non-Medical):   Physical Activity:   . Days of Exercise per Week:   . Minutes of Exercise per Session:   Stress:   . Feeling of Stress :   Social Connections:   . Frequency of Communication with Friends and Family:   . Frequency of Social Gatherings with Friends and Family:   . Attends Religious Services:   . Active Member of Clubs or Organizations:   . Attends Theatre manager Meetings:   Marland Kitchen Marital Status:   Intimate Partner Violence:   . Fear of Current or Ex-Partner:   . Emotionally Abused:   Marland Kitchen Physically Abused:   . Sexually Abused:     Family History  Problem Relation Age of Onset  . Lupus Mother   . Diabetes Father   . Breast cancer Maternal Grandmother   . Cancer Maternal Grandmother         breast  . Diabetes Paternal Grandmother   . Diabetes Paternal Grandfather   . Lupus Sister   . Heart disease Maternal Grandfather   . Ovarian cancer Neg Hx   . Colon cancer Neg Hx     The following portions of the patient's history were reviewed and updated as appropriate: allergies, current medications, past family history, past medical history, past social history, past surgical history and problem list.  Review of Systems  ROS- negative except as noted above. Information obtained from patient.   Objective:   BP (!) 136/103   Pulse 89   Ht 5' 1.5" (1.562 m)   Wt 169 lb 6 oz (76.8 kg)   BMI 31.48 kg/m    CONSTITUTIONAL: Well-developed, well-nourished female in no acute distress.   PHYSICAL EXAM: Not Indicated  Recent Results (from the past 2160 hour(s))  CBC     Status: Abnormal   Collection Time: 11/04/19 11:24 AM  Result Value Ref Range   WBC 14.2 (H) 3.4 - 10.8 x10E3/uL   RBC 3.80 3.77 - 5.28 x10E6/uL   Hemoglobin 10.9 (L) 11.1 - 15.9 g/dL   Hematocrit 32.5 (L) 34.0 - 46.6 %   MCV 86 79 - 97 fL   MCH 28.7 26.6 - 33.0 pg   MCHC 33.5 31 - 35 g/dL   RDW 11.9 11.7 - 15.4 %   Platelets 173 150 - 450 x10E3/uL  Glucose, 1 hour gestational     Status: None   Collection Time: 11/04/19 11:24 AM  Result Value Ref Range   Gestational Diabetes Screen 104 65 - 139 mg/dL    Comment: According to ADA, a glucose threshold of >139 mg/dL after 50-gram load identifies approximately 80% of women with gestational diabetes mellitus, while the sensitivity is further increased to approximately 90% by a threshold of >129 mg/dL.   RPR      Status: None   Collection Time: 11/04/19 11:24 AM  Result Value Ref Range   RPR Ser Ql Non Reactive Non Reactive  POC Urinalysis Dipstick OB  Status: None   Collection Time: 11/04/19  3:56 PM  Result Value Ref Range   Color, UA yellow    Clarity, UA clear    Glucose, UA Negative Negative   Bilirubin, UA neg    Ketones, UA neg    Spec Grav, UA 1.010 1.010 - 1.025   Blood, UA neg    pH, UA 5.0 5.0 - 8.0   POC,PROTEIN,UA Negative Negative, Trace, Small (1+), Moderate (2+), Large (3+), 4+   Urobilinogen, UA 0.2 0.2 or 1.0 E.U./dL   Nitrite, UA neg    Leukocytes, UA Negative Negative   Appearance     Odor    POC Urinalysis Dipstick OB     Status: None   Collection Time: 11/21/19  9:31 AM  Result Value Ref Range   Color, UA yellow    Clarity, UA clear    Glucose, UA Negative Negative   Bilirubin, UA neg    Ketones, UA neg    Spec Grav, UA 1.015 1.010 - 1.025   Blood, UA neg    pH, UA 5.0 5.0 - 8.0   POC,PROTEIN,UA Negative Negative, Trace, Small (1+), Moderate (2+), Large (3+), 4+   Urobilinogen, UA 0.2 0.2 or 1.0 E.U./dL   Nitrite, UA neg    Leukocytes, UA Negative Negative   Appearance     Odor    Cervicovaginal ancillary only     Status: None   Collection Time: 11/21/19 12:09 PM  Result Value Ref Range   Neisseria Gonorrhea Negative    Chlamydia Negative    Trichomonas Negative    Bacterial Vaginitis (gardnerella) Negative    Candida Vaginitis Negative    Candida Glabrata Negative    Comment      Normal Reference Range Bacterial Vaginosis - Negative   Comment Normal Reference Range Candida Species - Negative    Comment Normal Reference Range Candida Galbrata - Negative    Comment Normal Reference Range Trichomonas - Negative    Comment Normal Reference Ranger Chlamydia - Negative    Comment      Normal Reference Range Neisseria Gonorrhea - Negative  POC Urinalysis Dipstick OB     Status: None   Collection Time: 12/04/19  3:48 PM  Result Value Ref Range    Color, UA yellow    Clarity, UA clear    Glucose, UA Negative Negative   Bilirubin, UA neg    Ketones, UA neg    Spec Grav, UA 1.015 1.010 - 1.025   Blood, UA neg    pH, UA 5.0 5.0 - 8.0   POC,PROTEIN,UA Negative Negative, Trace, Small (1+), Moderate (2+), Large (3+), 4+   Urobilinogen, UA 0.2 0.2 or 1.0 E.U./dL   Nitrite, UA neg    Leukocytes, UA Negative Negative   Appearance     Odor    Fetal fibronectin     Status: None   Collection Time: 12/19/19 12:11 AM  Result Value Ref Range   Fetal Fibronectin NEGATIVE NEGATIVE    Comment: Performed at [facility], [address] [place],  [address]  Urinalysis, Routine w reflex microscopic     Status: Abnormal   Collection Time: 12/19/19 12:11 AM  Result Value Ref Range   Color, Urine YELLOW (A) YELLOW   APPearance HAZY (A) CLEAR   Specific Gravity, Urine 1.010 1.005 - 1.030   pH 7.0 5.0 - 8.0   Glucose, UA NEGATIVE NEGATIVE mg/dL   Hgb urine dipstick NEGATIVE NEGATIVE   Bilirubin Urine NEGATIVE NEGATIVE  Ketones, ur NEGATIVE NEGATIVE mg/dL   Protein, ur NEGATIVE NEGATIVE mg/dL   Nitrite NEGATIVE NEGATIVE   Leukocytes,Ua LARGE (A) NEGATIVE   RBC / HPF 0-5 0 - 5 RBC/hpf   WBC, UA 6-10 0 - 5 WBC/hpf   Bacteria, UA RARE (A) NONE SEEN   Squamous Epithelial / LPF 0-5 0 - 5   Mucus PRESENT     Comment: Performed at [facility], [address] [place], [place] [address]  POC Urinalysis Dipstick OB     Status: None   Collection Time: 12/19/19 10:58 AM  Result Value Ref Range   Color, UA yellow    Clarity, UA clear    Glucose, UA Negative Negative   Bilirubin, UA neg    Ketones, UA neg    Spec Grav, UA 1.015 1.010 - 1.025   Blood, UA neg    pH, UA 5.0 5.0 - 8.0   POC,PROTEIN,UA Negative Negative, Trace, Small (1+), Moderate (2+), Large (3+), 4+   Urobilinogen, UA 0.2 0.2 or 1.0 E.U./dL   Nitrite, UA neg    Leukocytes, UA Negative Negative   Appearance     Odor    Strep Gp B NAA     Status: None    Collection Time: 12/19/19 12:12 PM   Specimen: Vaginal/Rectal; GYN   VR  Result Value Ref Range   Strep Gp B NAA Negative Negative    Comment: Centers for Disease Control and Prevention (CDC) and American Congress of Obstetricians and Gynecologists (ACOG) guidelines for prevention of perinatal group B streptococcal (GBS) disease specify co-collection of a vaginal and rectal swab specimen to maximize sensitivity of GBS detection. Per the CDC and ACOG, swabbing both the lower vagina and rectum substantially increases the yield of detection compared with sampling the vagina alone. Penicillin G, ampicillin, or cefazolin are indicated for intrapartum prophylaxis of perinatal GBS colonization. Reflex susceptibility testing should be performed prior to use of clindamycin only on GBS isolates from penicillin-allergic women who are considered a high risk for anaphylaxis. Treatment with vancomycin without additional testing is warranted if resistance to clindamycin is noted.   GC/Chlamydia Probe Amp     Status: None   Collection Time: 12/19/19  1:36 PM   Specimen: Vaginal; GYN   VA  Result Value Ref Range   Chlamydia trachomatis, NAA Negative Negative   Neisseria Gonorrhoeae by PCR Negative Negative  POC Urinalysis Dipstick OB     Status: None   Collection Time: 12/25/19  3:04 PM  Result Value Ref Range   Color, UA yellow    Clarity, UA clear    Glucose, UA Negative Negative   Bilirubin, UA neg    Ketones, UA neg    Spec Grav, UA 1.010 1.010 - 1.025   Blood, UA neg    pH, UA 5.0 5.0 - 8.0   POC,PROTEIN,UA Negative Negative, Trace, Small (1+), Moderate (2+), Large (3+), 4+   Urobilinogen, UA 0.2 0.2 or 1.0 E.U./dL   Nitrite, UA neg    Leukocytes, UA Negative Negative   Appearance     Odor    POC Urinalysis Dipstick OB     Status: Abnormal   Collection Time: 12/27/19  3:55 PM  Result Value Ref Range   Color, UA yellow    Clarity, UA clear    Glucose, UA Negative Negative    Bilirubin, UA neg    Ketones, UA neg    Spec Grav, UA <=1.005 (A) 1.010 - 1.025   Blood, UA  neg    pH, UA 7.5 5.0 - 8.0   POC,PROTEIN,UA Negative Negative, Trace, Small (1+), Moderate (2+), Large (3+), 4+   Urobilinogen, UA 0.2 0.2 or 1.0 E.U./dL   Nitrite, UA neg    Leukocytes, UA Moderate (2+) (A) Negative   Appearance yellow;clear    Odor    POC Urinalysis Dipstick OB     Status: None   Collection Time: 01/03/20 11:01 AM  Result Value Ref Range   Color, UA yellow    Clarity, UA clear    Glucose, UA Negative Negative   Bilirubin, UA neg    Ketones, UA neg    Spec Grav, UA 1.010 1.010 - 1.025   Blood, UA neg    pH, UA 6.0 5.0 - 8.0   POC,PROTEIN,UA Negative Negative, Trace, Small (1+), Moderate (2+), Large (3+), 4+   Urobilinogen, UA 0.2 0.2 or 1.0 E.U./dL   Nitrite, UA neg    Leukocytes, UA Negative Negative   Appearance     Odor    ROM Plus ([facility] only)     Status: None   Collection Time: 01/05/20  7:58 PM  Result Value Ref Range   Rom Plus NEGATIVE     Comment: Performed at [facility], [address] [place], [place] [address]  Wet prep, genital     Status: Abnormal   Collection Time: 01/05/20  7:58 PM  Result Value Ref Range   Yeast Wet Prep HPF POC NONE SEEN NONE SEEN   Trich, Wet Prep NONE SEEN NONE SEEN   Clue Cells Wet Prep HPF POC PRESENT (A) NONE SEEN   WBC, Wet Prep HPF POC MANY (A) NONE SEEN   Sperm NONE SEEN     Comment: Performed at [facility], [address] [place], [place] [address]  POC Urinalysis Dipstick OB     Status: Abnormal   Collection Time: 01/10/20 10:39 AM  Result Value Ref Range   Color, UA yellow    Clarity, UA clear    Glucose, UA Negative Negative   Bilirubin, UA neg    Ketones, UA neg    Spec Grav, UA <=1.005 (A) 1.010 - 1.025   Blood, UA neg    pH, UA 5.0 5.0 - 8.0   POC,PROTEIN,UA Negative Negative, Trace, Small (1+), Moderate (2+), Large (3+), 4+   Urobilinogen, UA 0.2 0.2 or 1.0 E.U./dL   Nitrite, UA  neg    Leukocytes, UA Negative Negative   Appearance     Odor    CBC     Status: Abnormal   Collection Time: 01/15/20 11:37 AM  Result Value Ref Range   WBC 10.9 (H) 3.4 - 10.8 x10E3/uL   RBC 3.69 (L) 3.77 - 5.28 x10E6/uL   Hemoglobin 9.9 (L) 11.1 - 15.9 g/dL   Hematocrit 29.9 (L) 34.0 - 46.6 %   MCV 81 79 - 97 fL   MCH 26.8 26.6 - 33.0 pg   MCHC 33.1 31 - 35 g/dL   RDW 13.6 11.7 - 15.4 %   Platelets 154 150 - 450 x10E3/uL  Comp Met (CMET)     Status: Abnormal   Collection Time: 01/15/20 11:37 AM  Result Value Ref Range   Glucose 74 65 - 99 mg/dL   BUN 6 6 - 20 mg/dL   Creatinine, Ser 0.65 0.57 - 1.00 mg/dL   GFR calc non Af Amer 126 >59 mL/min/1.73   GFR calc Af Amer 145 >59 mL/min/1.73    Comment: **[facility] currently  reports eGFR in compliance with the current**   recommendations of the [employer]. [facility] will   update reporting as new guidelines are published from the NKF-ASN   Task force.    BUN/Creatinine Ratio 9 9 - 23   Sodium 140 134 - 144 mmol/L   Potassium 3.9 3.5 - 5.2 mmol/L   Chloride 106 96 - 106 mmol/L   CO2 20 20 - 29 mmol/L   Calcium 9.2 8.7 - 10.2 mg/dL   Total Protein 5.7 (L) 6.0 - 8.5 g/dL   Albumin 3.3 (L) 3.9 - 5.0 g/dL   Globulin, Total 2.4 1.5 - 4.5 g/dL   Albumin/Globulin Ratio 1.4 1.2 - 2.2   Bilirubin Total 0.2 0.0 - 1.2 mg/dL   Alkaline Phosphatase 275 (H) 48 - 121 IU/L   AST 18 0 - 40 IU/L   ALT 7 0 - 32 IU/L  Protein / Creatinine Ratio, Urine     Status: Abnormal   Collection Time: 01/15/20 11:37 AM  Result Value Ref Range   Creatinine, Urine 35.7 Not Estab. mg/dL   Protein, Ur 14.6 Not Estab. mg/dL   Protein/Creat Ratio 409 (H) 0 - 200 mg/g creat  POC Urinalysis Dipstick OB     Status: None   Collection Time: 01/15/20  2:01 PM  Result Value Ref Range   Color, UA yellow    Clarity, UA clear    Glucose, UA Negative Negative   Bilirubin, UA neg    Ketones, UA neg    Spec Grav, UA 1.015 1.010 - 1.025   Blood, UA neg     pH, UA 5.0 5.0 - 8.0   POC,PROTEIN,UA Negative Negative, Trace, Small (1+), Moderate (2+), Large (3+), 4+   Urobilinogen, UA 0.2 0.2 or 1.0 E.U./dL   Nitrite, UA neg    Leukocytes, UA Negative Negative   Appearance     Odor    CBC     Status: Abnormal   Collection Time: 01/15/20  9:14 PM  Result Value Ref Range   WBC 12.2 (H) 4.0 - 10.5 K/uL   RBC 3.65 (L) 3.87 - 5.11 MIL/uL   Hemoglobin 9.7 (L) 12.0 - 15.0 g/dL   HCT 30.0 (L) 36 - 46 %   MCV 82.2 80.0 - 100.0 fL   MCH 26.6 26.0 - 34.0 pg   MCHC 32.3 30.0 - 36.0 g/dL   RDW 14.1 11.5 - 15.5 %   Platelets 164 150 - 400 K/uL   nRBC 0.0 0.0 - 0.2 %    Comment: Performed at [facility], [address] [place], [place] [address]  RPR     Status: None   Collection Time: 01/15/20  9:14 PM  Result Value Ref Range   RPR Ser Ql NON REACTIVE NON REACTIVE    Comment: Performed at [facility], [address], [place], [place] [address]  Type and screen     Status: None   Collection Time: 01/15/20  9:14 PM  Result Value Ref Range   ABO/RH(D) A POS    Antibody Screen NEG    Sample Expiration      01/18/2020,2359 Performed at [facility], [address] [place], [place] [address]   Respiratory Panel by RT PCR (Flu A&B, Covid) - Nasopharyngeal Swab     Status: None   Collection Time: 01/15/20  9:17 PM   Specimen: Nasopharyngeal Swab  Result Value Ref Range   SARS Coronavirus 2 by RT PCR NEGATIVE NEGATIVE  Comment: (NOTE) SARS-CoV-2 target nucleic acids are NOT DETECTED.  The SARS-CoV-2 RNA is generally detectable in upper respiratoy specimens during the acute phase of infection. The lowest concentration of SARS-CoV-2 viral copies this assay can detect is 131 copies/mL. A negative result does not preclude SARS-Cov-2 infection and should not be used as the sole basis for treatment or other patient management decisions. A negative result may occur with  improper specimen collection/handling, submission  of specimen other than nasopharyngeal swab, presence of viral mutation(s) within the areas targeted by this assay, and inadequate number of viral copies (<131 copies/mL). A negative result must be combined with clinical observations, patient history, and epidemiological information. The expected result is Negative.  Fact Sheet for Patients:  PinkCheek.be  Fact Sheet for Healthcare Providers:  GravelBags.it  This test is no t yet approved or cleared by the [place] FDA and  has been authorized for detection and/or diagnosis of SARS-CoV-2 by FDA under an Emergency Use Authorization (EUA). This EUA will remain  in effect (meaning this test can be used) for the duration of the COVID-19 declaration under Section 564(b)(1) of the Act, 21 U.S.C. section 360bbb-3(b)(1), unless the authorization is terminated or revoked sooner.     Influenza A by PCR NEGATIVE NEGATIVE   Influenza B by PCR NEGATIVE NEGATIVE    Comment: (NOTE) The Xpert Xpress SARS-CoV-2/FLU/RSV assay is intended as an aid in  the diagnosis of influenza from Nasopharyngeal swab specimens and  should not be used as a sole basis for treatment. Nasal washings and  aspirates are unacceptable for Xpert Xpress SARS-CoV-2/FLU/RSV  testing.  Fact Sheet for Patients: PinkCheek.be  Fact Sheet for Healthcare Providers: GravelBags.it  This test is not yet approved or cleared by the [place] FDA and  has been authorized for detection and/or diagnosis of SARS-CoV-2 by  FDA under an Emergency Use Authorization (EUA). This EUA will remain  in effect (meaning this test can be used) for the duration of the  Covid-19 declaration under Section 564(b)(1) of the Act, 21  U.S.C. section 360bbb-3(b)(1), unless the authorization is  terminated or revoked. Performed at [facility], [address]  [place], [place] [address]   CBC     Status: Abnormal   Collection Time: 01/16/20  8:09 AM  Result Value Ref Range   WBC 15.8 (H) 4.0 - 10.5 K/uL   RBC 3.37 (L) 3.87 - 5.11 MIL/uL   Hemoglobin 8.9 (L) 12.0 - 15.0 g/dL   HCT 27.9 (L) 36 - 46 %   MCV 82.8 80.0 - 100.0 fL   MCH 26.4 26.0 - 34.0 pg   MCHC 31.9 30.0 - 36.0 g/dL   RDW 14.2 11.5 - 15.5 %   Platelets 136 (L) 150 - 400 K/uL   nRBC 0.0 0.0 - 0.2 %    Comment: Performed at [facility], [address] [place], [place] [address]     Assessment:   1. Postpartum hypertension   2. History of severe pre-eclampsia   Plan:   Treatment options discussed with patient. Patient will continue to take Procardia 30 mg daily and keep a blood pressure log.  Patient will attempt to find help so she can rest and have some time for self-care  Encouraged snacking and eating small meals.  Reviewed red flag symptoms and when to call the office.   RTC x 1 week for [place] visit to discuss blood pressure log.   [place] 01/20/20  4:11 PM

## 2020-01-20 NOTE — Patient Instructions (Signed)
Blood Pressure Record Sheet To take your blood pressure, you will need a blood pressure machine. You can buy a blood pressure machine (blood pressure monitor) at your clinic, drug store, or online. When choosing one, consider:  An automatic monitor that has an arm cuff.  A cuff that wraps snugly around your upper arm. You should be able to fit only one finger between your arm and the cuff.  A device that stores blood pressure reading results.  Do not choose a monitor that measures your blood pressure from your wrist or finger. Follow your health care provider's instructions for how to take your blood pressure. To use this form:  Get one reading in the morning (a.m.) before you take any medicines.  Get one reading in the evening (p.m.) before supper.  Take at least 2 readings with each blood pressure check. This makes sure the results are correct. Wait 1-2 minutes between measurements.  Write down the results in the spaces on this form.  Repeat this once a week, or as told by your health care provider.  Make a follow-up appointment with your health care provider to discuss the results. Blood pressure log Date: _______________________  a.m. _____________________(1st reading) _____________________(2nd reading)  p.m. _____________________(1st reading) _____________________(2nd reading) Date: _______________________  a.m. _____________________(1st reading) _____________________(2nd reading)  p.m. _____________________(1st reading) _____________________(2nd reading) Date: _______________________  a.m. _____________________(1st reading) _____________________(2nd reading)  p.m. _____________________(1st reading) _____________________(2nd reading) Date: _______________________  a.m. _____________________(1st reading) _____________________(2nd reading)  p.m. _____________________(1st reading) _____________________(2nd reading) Date: _______________________  a.m.  _____________________(1st reading) _____________________(2nd reading)  p.m. _____________________(1st reading) _____________________(2nd reading) This information is not intended to replace advice given to you by your health care provider. Make sure you discuss any questions you have with your health care provider. Document Revised: 09/15/2017 Document Reviewed: 07/18/2017 Elsevier Patient Education  2020 Elsevier Inc.   Postpartum Hypertension Postpartum hypertension is high blood pressure that remains higher than normal after childbirth. You may not realize that you have postpartum hypertension if your blood pressure is not being checked regularly. In most cases, postpartum hypertension will go away on its own, usually within a week of delivery. However, for some women, medical treatment is required to prevent serious complications, such as seizures or stroke. What are the causes? This condition may be caused by one or more of the following:  Hypertension that existed before pregnancy (chronic hypertension).  Hypertension that comes on as a result of pregnancy (gestational hypertension).  Hypertensive disorders during pregnancy (preeclampsia) or seizures in women who have high blood pressure during pregnancy (eclampsia).  A condition in which the liver, platelets, and red blood cells are damaged during pregnancy (HELLP syndrome).  A condition in which the thyroid produces too much hormones (hyperthyroidism).  Other rare problems of the nerves (neurological disorders) or blood disorders. In some cases, the cause may not be known. What increases the risk? The following factors may make you more likely to develop this condition:  Chronic hypertension. In some cases, this may not have been diagnosed before pregnancy.  Obesity.  Type 2 diabetes.  Kidney disease.  History of preeclampsia or eclampsia.  Other medical conditions that change the level of hormones in the body (hormonal  imbalance). What are the signs or symptoms? As with all types of hypertension, postpartum hypertension may not have any symptoms. Depending on how high your blood pressure is, you may experience:  Headaches. These may be mild, moderate, or severe. They may also be steady, constant, or sudden in onset (thunderclap headache).  Changes in your ability to see (visual changes).  Dizziness.  Shortness of breath.  Swelling of your hands, feet, lower legs, or face. In some cases, you may have swelling in more than one of these locations.  Heart palpitations or a racing heartbeat.  Difficulty breathing while lying down.  Decrease in the amount of urine that you pass. Other rare signs and symptoms may include:  Sweating more than usual. This lasts longer than a few days after delivery.  Chest pain.  Sudden dizziness when you get up from sitting or lying down.  Seizures.  Nausea or vomiting.  Abdominal pain. How is this diagnosed? This condition may be diagnosed based on the results of a physical exam, blood pressure measurements, and blood and urine tests. You may also have other tests, such as a CT scan or an MRI, to check for other problems of postpartum hypertension. How is this treated? If blood pressure is high enough to require treatment, your options may include:  Medicines to reduce blood pressure (antihypertensives). Tell your health care provider if you are breastfeeding or if you plan to breastfeed. There are many antihypertensive medicines that are safe to take while breastfeeding.  Stopping medicines that may be causing hypertension.  Treating medical conditions that are causing hypertension.  Treating the complications of hypertension, such as seizures, stroke, or kidney problems. Your health care provider will also continue to monitor your blood pressure closely until it is within a safe range for you. Follow these instructions at home:  Take over-the-counter and  prescription medicines only as told by your health care provider.  Return to your normal activities as told by your health care provider. Ask your health care provider what activities are safe for you.  Do not use any products that contain nicotine or tobacco, such as cigarettes and e-cigarettes. If you need help quitting, ask your health care provider.  Keep all follow-up visits as told by your health care provider. This is important. Contact a health care provider if:  Your symptoms get worse.  You have new symptoms, such as: ? A headache that does not get better. ? Dizziness. ? Visual changes. Get help right away if:  You suddenly develop swelling in your hands, ankles, or face.  You have sudden, rapid weight gain.  You develop difficulty breathing, chest pain, racing heartbeat, or heart palpitations.  You develop severe pain in your abdomen.  You have any symptoms of a stroke. "BE FAST" is an easy way to remember the main warning signs of a stroke: ? B - Balance. Signs are dizziness, sudden trouble walking, or loss of balance. ? E - Eyes. Signs are trouble seeing or a sudden change in vision. ? F - Face. Signs are sudden weakness or numbness of the face, or the face or eyelid drooping on one side. ? A - Arms. Signs are weakness or numbness in an arm. This happens suddenly and usually on one side of the body. ? S - Speech. Signs are sudden trouble speaking, slurred speech, or trouble understanding what people say. ? T - Time. Time to call emergency services. Write down what time symptoms started.  You have other signs of a stroke, such as: ? A sudden, severe headache with no known cause. ? Nausea or vomiting. ? Seizure. These symptoms may represent a serious problem that is an emergency. Do not wait to see if the symptoms will go away. Get medical help right away. Call your local emergency services (911  in the [place] Do not drive yourself to the  hospital. Summary  Postpartum hypertension is high blood pressure that remains higher than normal after childbirth.  In most cases, postpartum hypertension will go away on its own, usually within a week of delivery.  For some women, medical treatment is required to prevent serious complications, such as seizures or stroke. This information is not intended to replace advice given to you by your health care provider. Make sure you discuss any questions you have with your health care provider. Document Revised: 08/24/2018 Document Reviewed: 05/08/2017 Elsevier Patient Education  2020 Elsevier Inc.  

## 2020-01-20 NOTE — Progress Notes (Signed)
I have seen, interviewed, and examined the patient in conjunction with the [employer] Nurse Practitioner student and affirm the diagnosis and management plan.   Gunnar Bulla, CNM [facility], [facility] 01/20/20 4:12 PM

## 2020-01-21 ENCOUNTER — Encounter: Payer: Medicaid Other | Admitting: Certified Nurse Midwife

## 2020-01-21 ENCOUNTER — Other Ambulatory Visit: Payer: Medicaid Other

## 2020-01-22 NOTE — Discharge Summary (Signed)
Physician Obstetric Discharge Summary  Patient ID: Tamara Bishop MRN: [id number] DOB/AGE: [birth year] 24 y.o.   Date of Admission: 01/15/2020  Date of Discharge: 01/17/2020  Admitting Diagnosis: Induction of labor at [redacted]w[redacted]d  Secondary Diagnosis: Preeclampsia, Anxiety and depression, Vitamin D deficiency, Vitamin B-12 deficiency  Mode of Delivery: normal spontaneous vaginal delivery     Discharge Diagnosis: No other diagnosis   Intrapartum Procedures: Atificial rupture of membranes, epidural, pitocin augmentation, placement of fetal scalp electrode, placement of intrauterine catheter and cytotec induction   Post partum procedures: None  Complications: None   Brief Hospital Course   Tamara Bishop is a [id number] who had a SVD on 01/16/2020;  for further details, please refer to the delivey summary.  Patient had an uncomplicated postpartum course except for elevated blood pressure requiring the initiation of oral procardia.  By time of discharge on PPD#1, her pain was controlled on oral pain medications; she had appropriate lochia and was ambulating, voiding without difficulty and tolerating regular diet.  She was deemed stable for discharge to home with follow up in three (3) days for in office blood pressure check.   Labs:  CBC Latest Ref Rng & Units 01/16/2020 01/15/2020 01/15/2020  WBC 4.0 - 10.5 K/uL 15.8(H) 12.2(H) 10.9(H)  Hemoglobin 12.0 - 15.0 g/dL 8.9(L) 9.7(L) 9.9(L)  Hematocrit 36 - 46 % 27.9(L) 30.0(L) 29.9(L)  Platelets 150 - 400 K/uL 136(L) 164 154   A POS  Physical exam:   Blood pressure 128/80, pulse (!) 52, temperature 98.6 F (37 C), resp. rate 18, height 5' 1.5" (1.562 m), weight 85 kg, last menstrual period 04/14/2019, SpO2 100 %, unknown if currently breastfeeding. General: alert and no distress  Lochia: appropriate  Abdomen: soft, NT  Uterine Fundus: firm  Perineum:no significant drainage, no dehiscence, no significant erythema  Extremities: No  evidence of DVT seen on physical exam. No lower extremity edema.  Edinburgh Postnatal Depression Scale Screening Tool 01/16/2020  I have been able to laugh and see the funny side of things. 0  I have looked forward with enjoyment to things. 0  I have blamed myself unnecessarily when things went wrong. 1  I have been anxious or worried for no good reason. 1  I have felt scared or panicky for no good reason. 0  Things have been getting on top of me. 1  I have been so unhappy that I have had difficulty sleeping. 0  I have felt sad or miserable. 1  I have been so unhappy that I have been crying. 0  The thought of harming myself has occurred to me. 0  Edinburgh Postnatal Depression Scale Total 4      Discharge Instructions: Per After Visit Summary.  Activity: Advance as tolerated. Pelvic rest for 6 weeks.  Also refer to After Visit Summary  Diet: Regular  Medications: Allergies as of 01/17/2020      Reactions   Azithromycin Other (See Comments)   Felt like her stomach was going to come out of her body      Medication List    STOP taking these medications   aspirin EC 81 MG tablet   metroNIDAZOLE 500 MG tablet Commonly known as: FLAGYL     TAKE these medications   acetaminophen 325 MG tablet Commonly known as: Tylenol Take 2 tablets (650 mg total) by mouth every 4 (four) hours as needed (for pain scale < 4).   coconut oil Oil Apply 1 application topically as needed.   Fusion Plus  Caps Take 1 tablet by mouth daily.   ibuprofen 600 MG tablet Commonly known as: ADVIL Take 1 tablet (600 mg total) by mouth every 6 (six) hours.   multivitamin-prenatal 27-0.8 MG Tabs tablet Take 1 tablet by mouth daily at 12 noon.   NIFEdipine 30 MG 24 hr tablet Commonly known as: ADALAT CC Take 1 tablet (30 mg total) by mouth daily.      Outpatient follow up:   Follow-up Information    [name], CNM. Go on 01/20/2020.   Specialties: Certified Nurse Midwife, Radiology Why:  Blood pressure check Monday 6/21 at 11:30 Contact information: [address] [place] [address] [phone]              Postpartum contraception: Nexplanon  Discharged Condition: stable  Discharged to: home   Newborn Data:  Disposition:home with mother  Apgars: APGAR (1 MIN): 8   APGAR (5 MINS): 9    Baby Feeding: Breast    Tamara Bishop, CNM  [facility], [facility] 01/22/20 1:25 PM

## 2020-01-27 ENCOUNTER — Ambulatory Visit (INDEPENDENT_AMBULATORY_CARE_PROVIDER_SITE_OTHER): Payer: Medicaid Other | Admitting: Certified Nurse Midwife

## 2020-01-27 ENCOUNTER — Other Ambulatory Visit: Payer: Self-pay

## 2020-01-27 ENCOUNTER — Encounter: Payer: Self-pay | Admitting: Certified Nurse Midwife

## 2020-01-27 DIAGNOSIS — Z0130 Encounter for examination of blood pressure without abnormal findings: Secondary | ICD-10-CM

## 2020-01-27 MED ORDER — NIFEDIPINE ER OSMOTIC RELEASE 60 MG PO TB24
60.0000 mg | ORAL_TABLET | Freq: Every day | ORAL | 0 refills | Status: DC
Start: 2020-01-27 — End: 2020-04-21

## 2020-01-27 MED ORDER — ESCITALOPRAM OXALATE 10 MG PO TABS: 10.0000 mg | ORAL_TABLET | Freq: Every day | ORAL | 0 refills | Status: DC

## 2020-01-27 NOTE — Progress Notes (Signed)
A call was placed to patient- BP readings are as follows- 01/21/20 AM 152/95; 01/22/20 AM 146/92; 01/23/20 AM 136/98; 01/24/20 AM 140/82; 01/25/20 AM 158/103; 01/26/20 AM 150/107; 01/27/20 AM 145/100. Call transferred to [facility] CNM

## 2020-01-27 NOTE — Progress Notes (Signed)
Virtual Visit via Telephone Note  I connected with [name] on 01/27/20 at 11:00 AM EDT by telephone and verified that I am speaking with the correct person using two identifiers.   I discussed the limitations, risks, security and privacy concerns of performing an evaluation and management service by telephone and the availability of in person appointments. I also discussed with the patient that there may be a patient responsible charge related to this service. The patient expressed understanding and agreed to proceed.   History of Present Illness: 24 yr old SVD with mild pre eclampsia started on procardia for BP management postpartum . Was seen in office last week with BP check . Tele visit today follow up BP.    Observations/Objective: She state she is have anxiety and depression and is interested in starting medications. She feels like she is "sad more often than not" , she is bonding with the baby but has panic episodes , and anxious . Her heart races and she feels over whelmed.  She has taken Lexapro in the past with good results.  BP have remained elevated at home with self monitoring pt reports BP readings as follows. 6/22-152/95,  6/23-146/92,  6/24 136/98,  6/25- 140/82 6/26-158/103 6/27-150/107 6/28-145/100  Assessment and Plan: Procardia dose increased 60 mg daily. Red flag symptoms reviewed.  GAD 7 : Generalized Anxiety Score 01/27/2020 10/10/2019 11/23/2018 10/22/2018  Nervous, Anxious, on Edge 3 0 1 1  Control/stop worrying 3 0 1 1  Worry too much - different things 3 0 1 1  Trouble relaxing 2 0 1 1  Restless 3 0 1 1  Easily annoyed or irritable 3 0 3 0  Afraid - awful might happen 2 0 1 1  Total GAD 7 Score 19 0 9 6  Anxiety Difficulty Very difficult Not difficult at all Somewhat difficult Not difficult at all     Office Visit from 01/27/2020 in [facility]  PHQ-9 Total Score 15      Follow Up Instructions: Procardia increased. Lexapro ordered. Pt  will follow up on Friday as scheduled with Marcelino Duster. Red flag symptoms reviewed.   [name], CNM    I discussed the assessment and treatment plan with the patient. The patient was provided an opportunity to ask questions and all were answered. The patient agreed with the plan and demonstrated an understanding of the instructions.   The patient was advised to call back or seek an in-person evaluation if the symptoms worsen or if the condition fails to improve as anticipated.  I provided 11 minutes of non-face-to-face time during this encounter.   [name], CNM

## 2020-01-27 NOTE — Patient Instructions (Signed)

## 2020-01-31 ENCOUNTER — Encounter: Payer: Self-pay | Admitting: Certified Nurse Midwife

## 2020-01-31 ENCOUNTER — Other Ambulatory Visit: Payer: Self-pay

## 2020-01-31 ENCOUNTER — Ambulatory Visit (INDEPENDENT_AMBULATORY_CARE_PROVIDER_SITE_OTHER): Payer: Medicaid Other | Admitting: Certified Nurse Midwife

## 2020-01-31 VITALS — BP 120/93 | HR 78 | Ht 61.5 in | Wt 157.5 lb

## 2020-01-31 DIAGNOSIS — Z30017 Encounter for initial prescription of implantable subdermal contraceptive: Secondary | ICD-10-CM

## 2020-01-31 DIAGNOSIS — Z0130 Encounter for examination of blood pressure without abnormal findings: Secondary | ICD-10-CM

## 2020-01-31 NOTE — Patient Instructions (Signed)

## 2020-02-01 NOTE — Progress Notes (Signed)
BRITTANIE DOSANJH is a 24 y.o. year old Caucasian female here for Nexplanon insertion.  No LMP recorded. (Menstrual status: Lactating)., last sexual intercourse was prior to giving birth.  Risks/benefits/side effects of Nexplanon have been discussed and her questions have been answered.  Specifically, a failure rate of 08/998 has been reported, with an increased failure rate if pt takes St. John's Wort and/or antiseizure medicaitons.  Niambi TIANNI ESCAMILLA is aware of the common side effect of irregular bleeding, which the incidence of decreases over time.  BP (!) 120/93   Pulse 78   Ht 5' 1.5" (1.562 m)   Wt 157 lb 8 oz (71.4 kg)   BMI 29.28 kg/m   She is right-handed, so her left arm, approximately 4 inches proximal from the elbow, was cleansed with alcohol and anesthetized with 2cc of 2% Lidocaine.  The area was cleansed again with betadine and the Nexplanon was inserted per manufacturer's recommendations without difficulty.  A steri-strip and pressure bandage were applied.  Pt was instructed to keep the area clean and dry, remove pressure bandage in 24 hours, and keep insertion site covered with the steri-strip for 3-5 days.  Back up contraception was recommended for 2 weeks.  She was given a card indicating date Nexplanon was inserted and date it needs to be removed.   Blood pressure responding to 60 mg PO Procardia daily. Advised to continue as ordered.   Reviewed red flag symptoms and when to call.   RTC as previously scheduled or sooner if needed.    Serafina Royals, CNM [facility], [facility]

## 2020-02-19 ENCOUNTER — Other Ambulatory Visit: Payer: Self-pay

## 2020-02-19 MED ORDER — ESCITALOPRAM OXALATE 20 MG PO TABS
20.0000 mg | ORAL_TABLET | Freq: Every day | ORAL | 2 refills | Status: DC
Start: 2020-02-19 — End: 2020-04-16

## 2020-02-19 NOTE — Telephone Encounter (Signed)
lexapro refill sent

## 2020-02-23 ENCOUNTER — Telehealth: Payer: Medicaid Other | Admitting: Nurse Practitioner

## 2020-02-23 DIAGNOSIS — J029 Acute pharyngitis, unspecified: Secondary | ICD-10-CM

## 2020-02-23 MED ORDER — LIDOCAINE VISCOUS HCL 2 % MT SOLN: OROMUCOSAL | 0 refills | Status: DC

## 2020-02-23 NOTE — Progress Notes (Signed)
We are sorry that you are not feeling well.  Here is how we plan to help!  Your symptoms indicate a likely viral infection (Pharyngitis).   Pharyngitis is inflammation in the back of the throat which can cause a sore throat, scratchiness and sometimes difficulty swallowing.   Pharyngitis is typically caused by a respiratory virus and will just run its course.  Please keep in mind that your symptoms could last up to 10 days.  For throat pain, we recommend over the counter oral pain relief medications such as acetaminophen or aspirin, or anti-inflammatory medications such as ibuprofen. These medications are usually safe with breastfeeding.  I am going to prescribe Viscous lidocaine to help with your throat discomfort.  You should gargle and spit one teaspoon (36mL) up to 3 times per day as needed up to 5 day.   Topical treatments such as oral throat lozenges or sprays may be used as needed.  Avoid close contact with loved ones, especially the very young and elderly.  Remember to wash your hands thoroughly throughout the day as this is the number one way to prevent the spread of infection and wipe down door knobs and counters with disinfectant.  After careful review of your answers, I would not recommend and antibiotic for your condition.  Antibiotics should not be used to treat conditions that we suspect are caused by viruses like the virus that causes the common cold or flu. However, some people can have Strep with atypical symptoms. You may need formal testing in clinic or office to confirm if your symptoms continue or worsen.  Providers prescribe antibiotics to treat infections caused by bacteria. Antibiotics are very powerful in treating bacterial infections when they are used properly.  To maintain their effectiveness, they should be used only when necessary.  Overuse of antibiotics has resulted in the development of super bugs that are resistant to treatment!    Home Care:  Only take medications as  instructed by your medical team.  Warm saltwater gargles 3 to 4 times daily as needed to help with sore throat pain/discomfort.  A steam or ultrasonic humidifier can help congestion.  You can place a towel over your head and breathe in the steam from hot water coming from a faucet.  Avoid close contacts especially the very young and the elderly.  Cover your mouth when you cough or sneeze.  Always remember to wash your hands.  Get Help Right Away If:  You develop worsening fever or throat pain.  You develop a severe head ache or visual changes.  Your symptoms persist after you have completed your treatment plan.  Make sure you  Understand these instructions.  Will watch your condition.  Will get help right away if you are not doing well or get worse.  Your e-visit answers were reviewed by a board certified advanced clinical practitioner to complete your personal care plan.  Depending on the condition, your plan could have included both over the counter or prescription medications.  If there is a problem please reply  once you have received a response from your provider.  Your safety is important to [place].  If you have drug allergies check your prescription carefully.    You can use MyChart to ask questions about todays visit, request a non-urgent call back, or ask for a work or school excuse for 24 hours related to this e-Visit. If it has been greater than 24 hours you will need to follow up with your provider, or  enter a new e-Visit to address those concerns.  You will get an e-mail in the next two days asking about your experience.  I hope that your e-visit has been valuable and will speed your recovery. Thank you for using e-visits.  I have spent at least 5 minutes reviewing and documenting in the patient's chart.

## 2020-02-24 NOTE — Telephone Encounter (Signed)
Pt called in and asked was Tamara Bishop her, I told the pt she is out of the office. The pt stated that she is having strep like symptoms. The pt stated that she wants to come in and have  A test. I told her that I will send a message to the provider.  The pt verbally understood. Please advise

## 2020-02-28 ENCOUNTER — Encounter: Payer: Self-pay | Admitting: Certified Nurse Midwife

## 2020-02-28 ENCOUNTER — Ambulatory Visit (INDEPENDENT_AMBULATORY_CARE_PROVIDER_SITE_OTHER): Payer: Medicaid Other | Admitting: Certified Nurse Midwife

## 2020-02-28 DIAGNOSIS — Z1331 Encounter for screening for depression: Secondary | ICD-10-CM

## 2020-02-28 DIAGNOSIS — Z8759 Personal history of other complications of pregnancy, childbirth and the puerperium: Secondary | ICD-10-CM

## 2020-02-28 NOTE — Progress Notes (Signed)
Subjective:    Tamara Bishop is a 24 y.o. G34P2002 Caucasian female who presents for a postpartum visit. She is 6 weeks postpartum following a spontaneous vaginal delivery at 39+5 gestational weeks. Anesthesia: epidural. I have fully reviewed the prenatal and intrapartum course.   Postpartum course has been complicated by elevated blood pressure, no longer taking Procardia.   Baby's course has been uncomplicated. Baby is feeding by breast.   Bleeding no bleeding. Bowel function is normal. Bladder function is normal.   Contraception method is Nexplanon.   Postpartum depression screening: negative. Score 1.  Taking Lexapro daily.   Last pap 04/2019 and was normal.  Denies difficulty breathing or respiratory distress, chest pain, abdominal pain, excessive vaginal bleeding, dysuria, and leg pain or swelling.   The following portions of the patient's history were reviewed and updated as appropriate: allergies, current medications, past medical history, past surgical history and problem list.  Review of Systems Pertinent items are noted in HPI.   Objective:   BP (!) 114/88   Pulse 64   Wt 151 lb 9.6 oz (68.8 kg)   BMI 28.18 kg/m   General:  alert, cooperative and no distress   Breasts:  deferred, no complaints  Lungs: clear to auscultation bilaterally  Heart:  regular rate and rhythm   Left arm: Nexplanon in place  Pelvic Exam: Declined by patient   Depression screen [facility] 2/9 02/28/2020 01/31/2020 01/27/2020 10/10/2019 11/23/2018  Decreased Interest 0 0 1 0 1  Down, Depressed, Hopeless 0 0 3 0 1  PHQ - 2 Score 0 0 4 0 2  Altered sleeping 0 0 1 0 3  Tired, decreased energy 0 0 2 0 3  Change in appetite 1 1 2  0 3  Feeling bad or failure about yourself  0 0 3 0 1  Trouble concentrating 0 0 2 0 0  Moving slowly or fidgety/restless 0 0 1 0 2  Suicidal thoughts 0 0 0 0 0  PHQ-9 Score 1 1 15  0 14  Difficult doing work/chores Not difficult at all Not difficult at all Somewhat  difficult Not difficult at all -   GAD 7 : Generalized Anxiety Score 01/31/2020 01/27/2020 10/10/2019 11/23/2018  Nervous, Anxious, on Edge 0 3 0 1  Control/stop worrying 1 3 0 1  Worry too much - different things 0 3 0 1  Trouble relaxing 0 2 0 1  Restless 0 3 0 1  Easily annoyed or irritable 0 3 0 3  Afraid - awful might happen 1 2 0 1  Total GAD 7 Score 2 19 0 9  Anxiety Difficulty - Very difficult Not difficult at all Somewhat difficult       Assessment:   Postpartum exam Six (6) wks s/p spontaneous vaginal birth Breastfeeding Depression screening Contraception counseling   Plan:   Encouraged routine health maintenance techniques.   Reviewed red flag symptoms and when to call.   Follow up in: 2 months for [facility] as previously scheduled or earlier if needed.   11/25/2018, CNM [facility], [facility] 02/28/20 4:18 PM

## 2020-02-28 NOTE — Progress Notes (Signed)
Pt here for 6 week PPV. She feels well with no complaints.

## 2020-02-28 NOTE — Patient Instructions (Signed)
Preventive Care 21-24 Years Old, Female Preventive care refers to visits with your health care provider and lifestyle choices that can promote health and wellness. This includes:  A yearly physical exam. This may also be called an annual well check.  Regular dental visits and eye exams.  Immunizations.  Screening for certain conditions.  Healthy lifestyle choices, such as eating a healthy diet, getting regular exercise, not using drugs or products that contain nicotine and tobacco, and limiting alcohol use. What can I expect for my preventive care visit? Physical exam Your health care provider will check your:  Height and weight. This may be used to calculate body mass index (BMI), which tells if you are at a healthy weight.  Heart rate and blood pressure.  Skin for abnormal spots. Counseling Your health care provider may ask you questions about your:  Alcohol, tobacco, and drug use.  Emotional well-being.  Home and relationship well-being.  Sexual activity.  Eating habits.  Work and work environment.  Method of birth control.  Menstrual cycle.  Pregnancy history. What immunizations do I need?  Influenza (flu) vaccine  This is recommended every year. Tetanus, diphtheria, and pertussis (Tdap) vaccine  You may need a Td booster every 10 years. Varicella (chickenpox) vaccine  You may need this if you have not been vaccinated. Human papillomavirus (HPV) vaccine  If recommended by your health care provider, you may need three doses over 6 months. Measles, mumps, and rubella (MMR) vaccine  You may need at least one dose of MMR. You may also need a second dose. Meningococcal conjugate (MenACWY) vaccine  One dose is recommended if you are age 19-21 years and a first-year college student living in a residence hall, or if you have one of several medical conditions. You may also need additional booster doses. Pneumococcal conjugate (PCV13) vaccine  You may need  this if you have certain conditions and were not previously vaccinated. Pneumococcal polysaccharide (PPSV23) vaccine  You may need one or two doses if you smoke cigarettes or if you have certain conditions. Hepatitis A vaccine  You may need this if you have certain conditions or if you travel or work in places where you may be exposed to hepatitis A. Hepatitis B vaccine  You may need this if you have certain conditions or if you travel or work in places where you may be exposed to hepatitis B. Haemophilus influenzae type b (Hib) vaccine  You may need this if you have certain conditions. You may receive vaccines as individual doses or as more than one vaccine together in one shot (combination vaccines). Talk with your health care provider about the risks and benefits of combination vaccines. What tests do I need?  Blood tests  Lipid and cholesterol levels. These may be checked every 5 years starting at age 20.  Hepatitis C test.  Hepatitis B test. Screening  Diabetes screening. This is done by checking your blood sugar (glucose) after you have not eaten for a while (fasting).  Sexually transmitted disease (STD) testing.  BRCA-related cancer screening. This may be done if you have a family history of breast, ovarian, tubal, or peritoneal cancers.  Pelvic exam and Pap test. This may be done every 3 years starting at age 21. Starting at age 30, this may be done every 5 years if you have a Pap test in combination with an HPV test. Talk with your health care provider about your test results, treatment options, and if necessary, the need for more tests.   Follow these instructions at home: Eating and drinking   Eat a diet that includes fresh fruits and vegetables, whole grains, lean protein, and low-fat dairy.  Take vitamin and mineral supplements as recommended by your health care provider.  Do not drink alcohol if: ? Your health care provider tells you not to drink. ? You are  pregnant, may be pregnant, or are planning to become pregnant.  If you drink alcohol: ? Limit how much you have to 0-1 drink a day. ? Be aware of how much alcohol is in your drink. In the [place] one drink equals one 12 oz bottle of beer (355 mL), one 5 oz glass of wine (148 mL), or one 1 oz glass of hard liquor (44 mL). Lifestyle  Take daily care of your teeth and gums.  Stay active. Exercise for at least 30 minutes on 5 or more days each week.  Do not use any products that contain nicotine or tobacco, such as cigarettes, e-cigarettes, and chewing tobacco. If you need help quitting, ask your health care provider.  If you are sexually active, practice safe sex. Use a condom or other form of birth control (contraception) in order to prevent pregnancy and STIs (sexually transmitted infections). If you plan to become pregnant, see your health care provider for a preconception visit. What's next?  Visit your health care provider once a year for a well check visit.  Ask your health care provider how often you should have your eyes and teeth checked.  Stay up to date on all vaccines. This information is not intended to replace advice given to you by your health care provider. Make sure you discuss any questions you have with your health care provider. Document Revised: 03/29/2018 Document Reviewed: 03/29/2018 Elsevier Patient Education  2020 [employer].

## 2020-04-16 ENCOUNTER — Encounter: Payer: Medicaid Other | Admitting: Certified Nurse Midwife

## 2020-04-16 ENCOUNTER — Other Ambulatory Visit: Payer: Self-pay

## 2020-04-16 MED ORDER — ESCITALOPRAM OXALATE 20 MG PO TABS: 20.0000 mg | ORAL_TABLET | Freq: Every day | ORAL | 0 refills | Status: DC

## 2020-04-21 ENCOUNTER — Ambulatory Visit (INDEPENDENT_AMBULATORY_CARE_PROVIDER_SITE_OTHER): Payer: Medicaid Other | Admitting: Certified Nurse Midwife

## 2020-04-21 ENCOUNTER — Other Ambulatory Visit (HOSPITAL_COMMUNITY)
Admission: RE | Admit: 2020-04-21 | Discharge: 2020-04-21 | Disposition: A | Discharge status: Home / Self Care | Payer: Medicaid Other | Source: Ambulatory Visit | Attending: Certified Nurse Midwife | Admitting: Certified Nurse Midwife

## 2020-04-21 ENCOUNTER — Other Ambulatory Visit: Payer: Self-pay

## 2020-04-21 ENCOUNTER — Encounter: Payer: Self-pay | Admitting: Certified Nurse Midwife

## 2020-04-21 VITALS — BP 129/87 | HR 81 | Ht 61.5 in | Wt 148.2 lb

## 2020-04-21 DIAGNOSIS — R87612 Low grade squamous intraepithelial lesion on cytologic smear of cervix (LGSIL): Secondary | ICD-10-CM | POA: Insufficient documentation

## 2020-04-21 DIAGNOSIS — Z01419 Encounter for gynecological examination (general) (routine) without abnormal findings: Secondary | ICD-10-CM

## 2020-04-21 DIAGNOSIS — Z124 Encounter for screening for malignant neoplasm of cervix: Secondary | ICD-10-CM | POA: Diagnosis not present

## 2020-04-21 DIAGNOSIS — Z975 Presence of (intrauterine) contraceptive device: Secondary | ICD-10-CM | POA: Diagnosis not present

## 2020-04-21 MED ORDER — ESCITALOPRAM OXALATE 20 MG PO TABS: 20.0000 mg | ORAL_TABLET | Freq: Every day | ORAL | 1 refills | Status: DC

## 2020-04-21 NOTE — Progress Notes (Signed)
Pt declines flu vaccine

## 2020-04-21 NOTE — Patient Instructions (Signed)
Preventive Care 21-24 Years Old, Female Preventive care refers to visits with your health care provider and lifestyle choices that can promote health and wellness. This includes:  A yearly physical exam. This may also be called an annual well check.  Regular dental visits and eye exams.  Immunizations.  Screening for certain conditions.  Healthy lifestyle choices, such as eating a healthy diet, getting regular exercise, not using drugs or products that contain nicotine and tobacco, and limiting alcohol use. What can I expect for my preventive care visit? Physical exam Your health care provider will check your:  Height and weight. This may be used to calculate body mass index (BMI), which tells if you are at a healthy weight.  Heart rate and blood pressure.  Skin for abnormal spots. Counseling Your health care provider may ask you questions about your:  Alcohol, tobacco, and drug use.  Emotional well-being.  Home and relationship well-being.  Sexual activity.  Eating habits.  Work and work environment.  Method of birth control.  Menstrual cycle.  Pregnancy history. What immunizations do I need?  Influenza (flu) vaccine  This is recommended every year. Tetanus, diphtheria, and pertussis (Tdap) vaccine  You may need a Td booster every 10 years. Varicella (chickenpox) vaccine  You may need this if you have not been vaccinated. Human papillomavirus (HPV) vaccine  If recommended by your health care provider, you may need three doses over 6 months. Measles, mumps, and rubella (MMR) vaccine  You may need at least one dose of MMR. You may also need a second dose. Meningococcal conjugate (MenACWY) vaccine  One dose is recommended if you are age 19-21 years and a first-year college student living in a residence hall, or if you have one of several medical conditions. You may also need additional booster doses. Pneumococcal conjugate (PCV13) vaccine  You may need  this if you have certain conditions and were not previously vaccinated. Pneumococcal polysaccharide (PPSV23) vaccine  You may need one or two doses if you smoke cigarettes or if you have certain conditions. Hepatitis A vaccine  You may need this if you have certain conditions or if you travel or work in places where you may be exposed to hepatitis A. Hepatitis B vaccine  You may need this if you have certain conditions or if you travel or work in places where you may be exposed to hepatitis B. Haemophilus influenzae type b (Hib) vaccine  You may need this if you have certain conditions. You may receive vaccines as individual doses or as more than one vaccine together in one shot (combination vaccines). Talk with your health care provider about the risks and benefits of combination vaccines. What tests do I need?  Blood tests  Lipid and cholesterol levels. These may be checked every 5 years starting at age 20.  Hepatitis C test.  Hepatitis B test. Screening  Diabetes screening. This is done by checking your blood sugar (glucose) after you have not eaten for a while (fasting).  Sexually transmitted disease (STD) testing.  BRCA-related cancer screening. This may be done if you have a family history of breast, ovarian, tubal, or peritoneal cancers.  Pelvic exam and Pap test. This may be done every 3 years starting at age 21. Starting at age 30, this may be done every 5 years if you have a Pap test in combination with an HPV test. Talk with your health care provider about your test results, treatment options, and if necessary, the need for more tests.   Follow these instructions at home: Eating and drinking   Eat a diet that includes fresh fruits and vegetables, whole grains, lean protein, and low-fat dairy.  Take vitamin and mineral supplements as recommended by your health care provider.  Do not drink alcohol if: ? Your health care provider tells you not to drink. ? You are  pregnant, may be pregnant, or are planning to become pregnant.  If you drink alcohol: ? Limit how much you have to 0-1 drink a day. ? Be aware of how much alcohol is in your drink. In the [place] one drink equals one 12 oz bottle of beer (355 mL), one 5 oz glass of wine (148 mL), or one 1 oz glass of hard liquor (44 mL). Lifestyle  Take daily care of your teeth and gums.  Stay active. Exercise for at least 30 minutes on 5 or more days each week.  Do not use any products that contain nicotine or tobacco, such as cigarettes, e-cigarettes, and chewing tobacco. If you need help quitting, ask your health care provider.  If you are sexually active, practice safe sex. Use a condom or other form of birth control (contraception) in order to prevent pregnancy and STIs (sexually transmitted infections). If you plan to become pregnant, see your health care provider for a preconception visit. What's next?  Visit your health care provider once a year for a well check visit.  Ask your health care provider how often you should have your eyes and teeth checked.  Stay up to date on all vaccines. This information is not intended to replace advice given to you by your health care provider. Make sure you discuss any questions you have with your health care provider. Document Revised: 03/29/2018 Document Reviewed: 03/29/2018 Elsevier Patient Education  2020 [employer].    Pap Test Why am I having this test? A Pap test, also called a Pap smear, is a screening test to check for signs of:  Cancer of the vagina, cervix, and uterus. The cervix is the lower part of the uterus that opens into the vagina.  Infection.  Changes that may be a sign that cancer is developing (precancerous changes). Women need this test on a regular basis. In general, you should have a Pap test every 3 years until you reach menopause or age 46. Women aged 30-60 may choose to have their Pap test done at the same time as an HPV  (human papillomavirus) test every 5 years (instead of every 3 years). Your health care provider may recommend having Pap tests more or less often depending on your medical conditions and past Pap test results. What kind of sample is taken?  Your health care provider will collect a sample of cells from the surface of your cervix. This will be done using a small cotton swab, plastic spatula, or brush. This sample is often collected during a pelvic exam, when you are lying on your back on an exam table with feet in footrests (stirrups). In some cases, fluids (secretions) from the cervix or vagina may also be collected. How do I prepare for this test?  Be aware of where you are in your menstrual cycle. If you are menstruating on the day of the test, you may be asked to reschedule.  You may need to reschedule if you have a known vaginal infection on the day of the test.  Follow instructions from your health care provider about: ? Changing or stopping your regular medicines. Some medicines can cause  abnormal test results, such as digitalis and tetracycline. ? Avoiding douching or taking a bath the day before or the day of the test. Tell a health care provider about:  Any allergies you have.  All medicines you are taking, including vitamins, herbs, eye drops, creams, and over-the-counter medicines.  Any blood disorders you have.  Any surgeries you have had.  Any medical conditions you have.  Whether you are pregnant or may be pregnant. How are the results reported? Your test results will be reported as either abnormal or normal. A false-positive result can occur. A false positive is incorrect because it means that a condition is present when it is not. A false-negative result can occur. A false negative is incorrect because it means that a condition is not present when it is. What do the results mean? A normal test result means that you do not have signs of cancer of the vagina, cervix, or  uterus. An abnormal result may mean that you have:  Cancer. A Pap test by itself is not enough to diagnose cancer. You will have more tests done in this case.  Precancerous changes in your vagina, cervix, or uterus.  Inflammation of the cervix.  An STD (sexually transmitted disease).  A fungal infection.  A parasite infection. Talk with your health care provider about what your results mean. Questions to ask your health care provider Ask your health care provider, or the department that is doing the test:  When will my results be ready?  How will I get my results?  What are my treatment options?  What other tests do I need?  What are my next steps? Summary  In general, women should have a Pap test every 3 years until they reach menopause or age 47.  Your health care provider will collect a sample of cells from the surface of your cervix. This will be done using a small cotton swab, plastic spatula, or brush.  In some cases, fluids (secretions) from the cervix or vagina may also be collected. This information is not intended to replace advice given to you by your health care provider. Make sure you discuss any questions you have with your health care provider. Document Revised: 03/27/2017 Document Reviewed: 03/27/2017 Elsevier Patient Education  [employer].

## 2020-04-21 NOTE — Progress Notes (Signed)
ANNUAL PREVENTATIVE CARE GYN  ENCOUNTER NOTE  Subjective:       Tamara Bishop is a 24 y.o. [phone] female here for a routine annual gynecologic exam.  Current complaints: 1. Requests 90 day supply of Lexapro prior to move to [place] next month  Reports congestion starting this week.   Denies difficulty breathing or respiratory distress, chest pain, abdominal pain, excessive vaginal bleeding, dysuria, and leg pain or swelling.    Gynecologic History  No LMP recorded. (Menstrual status: Lactating).  Contraception: Nexplanon  Last Pap: 04/2019. Results were: normal; history LSIL in 2019  Obstetric History  OB History  Gravida Para Term Preterm AB Living  2 2 2  0 0 2  SAB TAB Ectopic Multiple Live Births  0 0 0 0 2    # Outcome Date GA Lbr Len/2nd Weight Sex Delivery Anes PTL Lv  2 Term 01/16/20 [redacted]w[redacted]d / 00:40 7 lb 7.9 oz (3.4 kg) M Vag-Spont EPI  LIV  1 Term 10/17/18 [redacted]w[redacted]d / 00:46 6 lb 8.8 oz (2.97 kg) F Vag-Spont EPI  LIV     Complications: Preeclampsia    Past Medical History:  Diagnosis Date  . Adjustment disorder with anxiety   . Allergic rhinitis   . Anemia   . Anxiety   . Cigarette smoker   . Depression   . Hypertension   . Insomnia   . Tobacco abuse     Past Surgical History:  Procedure Laterality Date  . NO PAST SURGERIES    . WISDOM TOOTH EXTRACTION      Current Outpatient Medications on File Prior to Visit  Medication Sig Dispense Refill  . escitalopram (LEXAPRO) 20 MG tablet Take 1 tablet (20 mg total) by mouth daily. 30 tablet 0  . coconut oil OIL Apply 1 application topically as needed. (Patient not taking: Reported on 02/28/2020) 120 mL 0  . docusate sodium (COLACE) 100 MG capsule Take 100 mg by mouth 2 (two) times daily. (Patient not taking: Reported on 02/28/2020)    . Iron-FA-B Cmp-C-Biot-Probiotic (FUSION PLUS) CAPS Take 1 tablet by mouth daily. (Patient not taking: Reported on 02/28/2020) 30 capsule 6  . lidocaine (XYLOCAINE) 2 % solution  Gargle and spit one teaspoon (3mL) up to 4 times daily as needed for throat pain for 5 days. (Patient not taking: Reported on 02/28/2020) 100 mL 0  . NIFEdipine (PROCARDIA XL) 60 MG 24 hr tablet Take 1 tablet (60 mg total) by mouth daily. (Patient not taking: Reported on 02/28/2020) 30 tablet 0  . Prenatal Vit-Fe Fumarate-FA (MULTIVITAMIN-PRENATAL) 27-0.8 MG TABS tablet Take 1 tablet by mouth daily at 12 noon. (Patient not taking: Reported on 02/28/2020)     No current facility-administered medications on file prior to visit.    Allergies  Allergen Reactions  . Azithromycin Other (See Comments)    Felt like her stomach was going to come out of her body    Social History   Socioeconomic History  . Marital status: Significant Other    Spouse name: 03/01/2020  . Number of children: Not on file  . Years of education: Not on file  . Highest education level: Not on file  Occupational History  . Not on file  Tobacco Use  . Smoking status: Former Smoker    Packs/day: 0.50    Years: 1.00    Pack years: 0.50    Types: Cigarettes  . Smokeless tobacco: Never Used  Vaping Use  . Vaping Use: Never used  Substance and  Sexual Activity  . Alcohol use: No  . Drug use: No  . Sexual activity: Yes    Birth control/protection: Implant    Comment: nexplanon  Other Topics Concern  . Not on file  Social History Narrative  . Not on file   Social Determinants of Health   Financial Resource Strain:   . Difficulty of Paying Living Expenses: Not on file  Food Insecurity:   . Worried About Programme researcher, broadcasting/film/video in the Last Year: Not on file  . Ran Out of Food in the Last Year: Not on file  Transportation Needs:   . Lack of Transportation (Medical): Not on file  . Lack of Transportation (Non-Medical): Not on file  Physical Activity:   . Days of Exercise per Week: Not on file  . Minutes of Exercise per Session: Not on file  Stress:   . Feeling of Stress : Not on file  Social Connections:   .  Frequency of Communication with Friends and Family: Not on file  . Frequency of Social Gatherings with Friends and Family: Not on file  . Attends Religious Services: Not on file  . Active Member of Clubs or Organizations: Not on file  . Attends Banker Meetings: Not on file  . Marital Status: Not on file  Intimate Partner Violence:   . Fear of Current or Ex-Partner: Not on file  . Emotionally Abused: Not on file  . Physically Abused: Not on file  . Sexually Abused: Not on file    Family History  Problem Relation Age of Onset  . Lupus Mother   . Diabetes Father   . Breast cancer Maternal Grandmother   . Cancer Maternal Grandmother         breast  . Diabetes Paternal Grandmother   . Diabetes Paternal Grandfather   . Lupus Sister   . Heart disease Maternal Grandfather   . Ovarian cancer Neg Hx   . Colon cancer Neg Hx     The following portions of the patient's history were reviewed and updated as appropriate: allergies, current medications, past family history, past medical history, past social history, past surgical history and problem list.  Review of Systems  ROS negative except as noted above. Information obtained from patient.    Objective:   BP 129/87   Pulse 81   Ht 5' 1.5" (1.562 m)   Wt 148 lb 3.2 oz (67.2 kg)   Breastfeeding Yes   BMI 27.55 kg/m    CONSTITUTIONAL: Well-developed, well-nourished female in no acute distress.   PSYCHIATRIC: Normal mood and affect. Normal behavior. Normal judgment and thought content.  NEUROLGIC: Alert and oriented to person, place, and time. Normal muscle tone coordination. No cranial nerve deficit noted.  HENT:  Normocephalic, atraumatic, External right and left ear normal.   EYES: Conjunctivae and EOM are normal. Pupils are equal and round.   NECK: Normal range of motion, supple, no masses.  Normal thyroid.   SKIN: Skin is warm and dry. No rash noted. Not diaphoretic. No erythema. No pallor. Nexplanon in  place-left arm.   CARDIOVASCULAR: Normal heart rate noted, regular rhythm, no murmur.  RESPIRATORY: Clear to auscultation bilaterally. Effort and breath sounds normal, no problems with respiration noted.  BREASTS: Symmetric in size. No masses, skin changes, nipple drainage, or lymphadenopathy. Lactating.   ABDOMEN: Soft, normal bowel sounds, no distention noted.  No tenderness, rebound or guarding.   PELVIC:  External Genitalia: Normal  Vagina: Normal  Cervix: Normal, Pap  collected  Uterus: Normal  Adnexa: Normal   MUSCULOSKELETAL: Normal range of motion. No tenderness.  No cyanosis, clubbing, or edema.  2+ distal pulses.  LYMPHATIC: No Axillary, Supraclavicular, or Inguinal Adenopathy.  Assessment:   Annual gynecologic examination 24 y.o.   Contraception: Nexplanon   Overweight   Problem List Items Addressed This Visit    None    Visit Diagnoses    Well woman exam    -  Primary   Relevant Orders   Cytology - PAP   LGSIL on Pap smear of cervix       Relevant Orders   Cytology - PAP   Screening for cervical cancer       Relevant Orders   Cytology - PAP      Plan:   Pap: Pap, Reflex if ASCUS  Labs: Declined  Flu vaccine given, see chart  Rx Lexapro, see orders  Routine preventative health maintenance measures emphasized: Exercise/Diet/Weight control, Tobacco Warnings, Alcohol/Substance use risks and Stress Management; see AVS  Reviewed red flag symptoms and when to call  Return to Clinic - 1 Year for ANNUAL EXAM    Serafina Royals, CNM  [facility], [facility] 04/21/20 5:46 PM

## 2020-04-24 LAB — CYTOLOGY - PAP: Diagnosis: NEGATIVE

## 2020-04-29 ENCOUNTER — Other Ambulatory Visit: Payer: Self-pay | Admitting: Certified Nurse Midwife

## 2020-04-29 DIAGNOSIS — H6690 Otitis media, unspecified, unspecified ear: Secondary | ICD-10-CM

## 2020-04-29 MED ORDER — CEFDINIR 300 MG PO CAPS: 300.0000 mg | ORAL_CAPSULE | Freq: Two times a day (BID) | ORAL | 0 refills | Status: AC

## 2020-04-29 NOTE — Progress Notes (Signed)
Rx Omnicef, see orders.    Serafina Royals, CNM [facility], [facility] 04/29/20 9:15 AM

## 2020-06-14 IMAGING — CR CHEST - 2 VIEW
2 series · 2 of 2 positions shown · non-contrast
Comparison: None

CLINICAL DATA: Three days postpartum, history of preeclampsia,
reports blurred vision, increased swelling, shortness of breath and
decreased urine output despite adequate intake

EXAM:
CHEST - 2 VIEW

[chest pa]
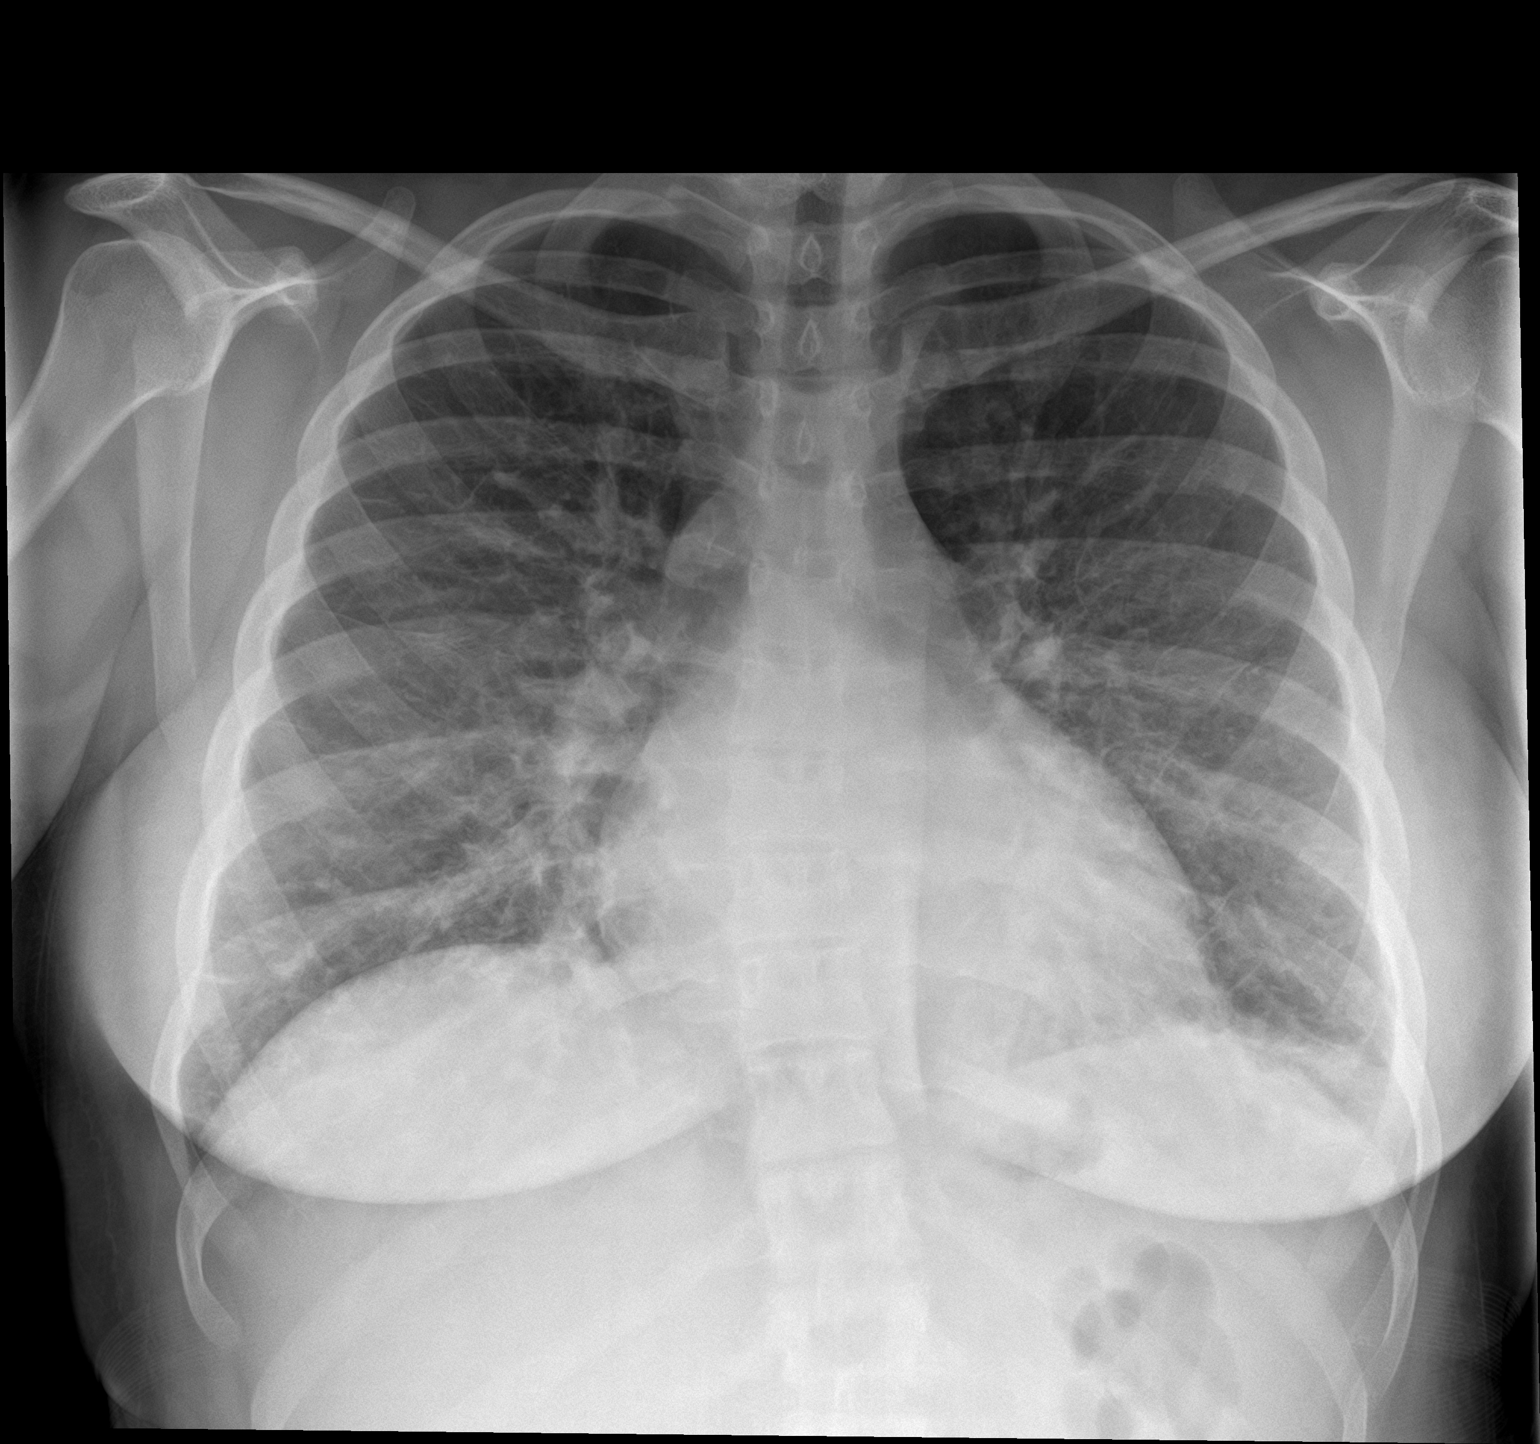

[chest lat]
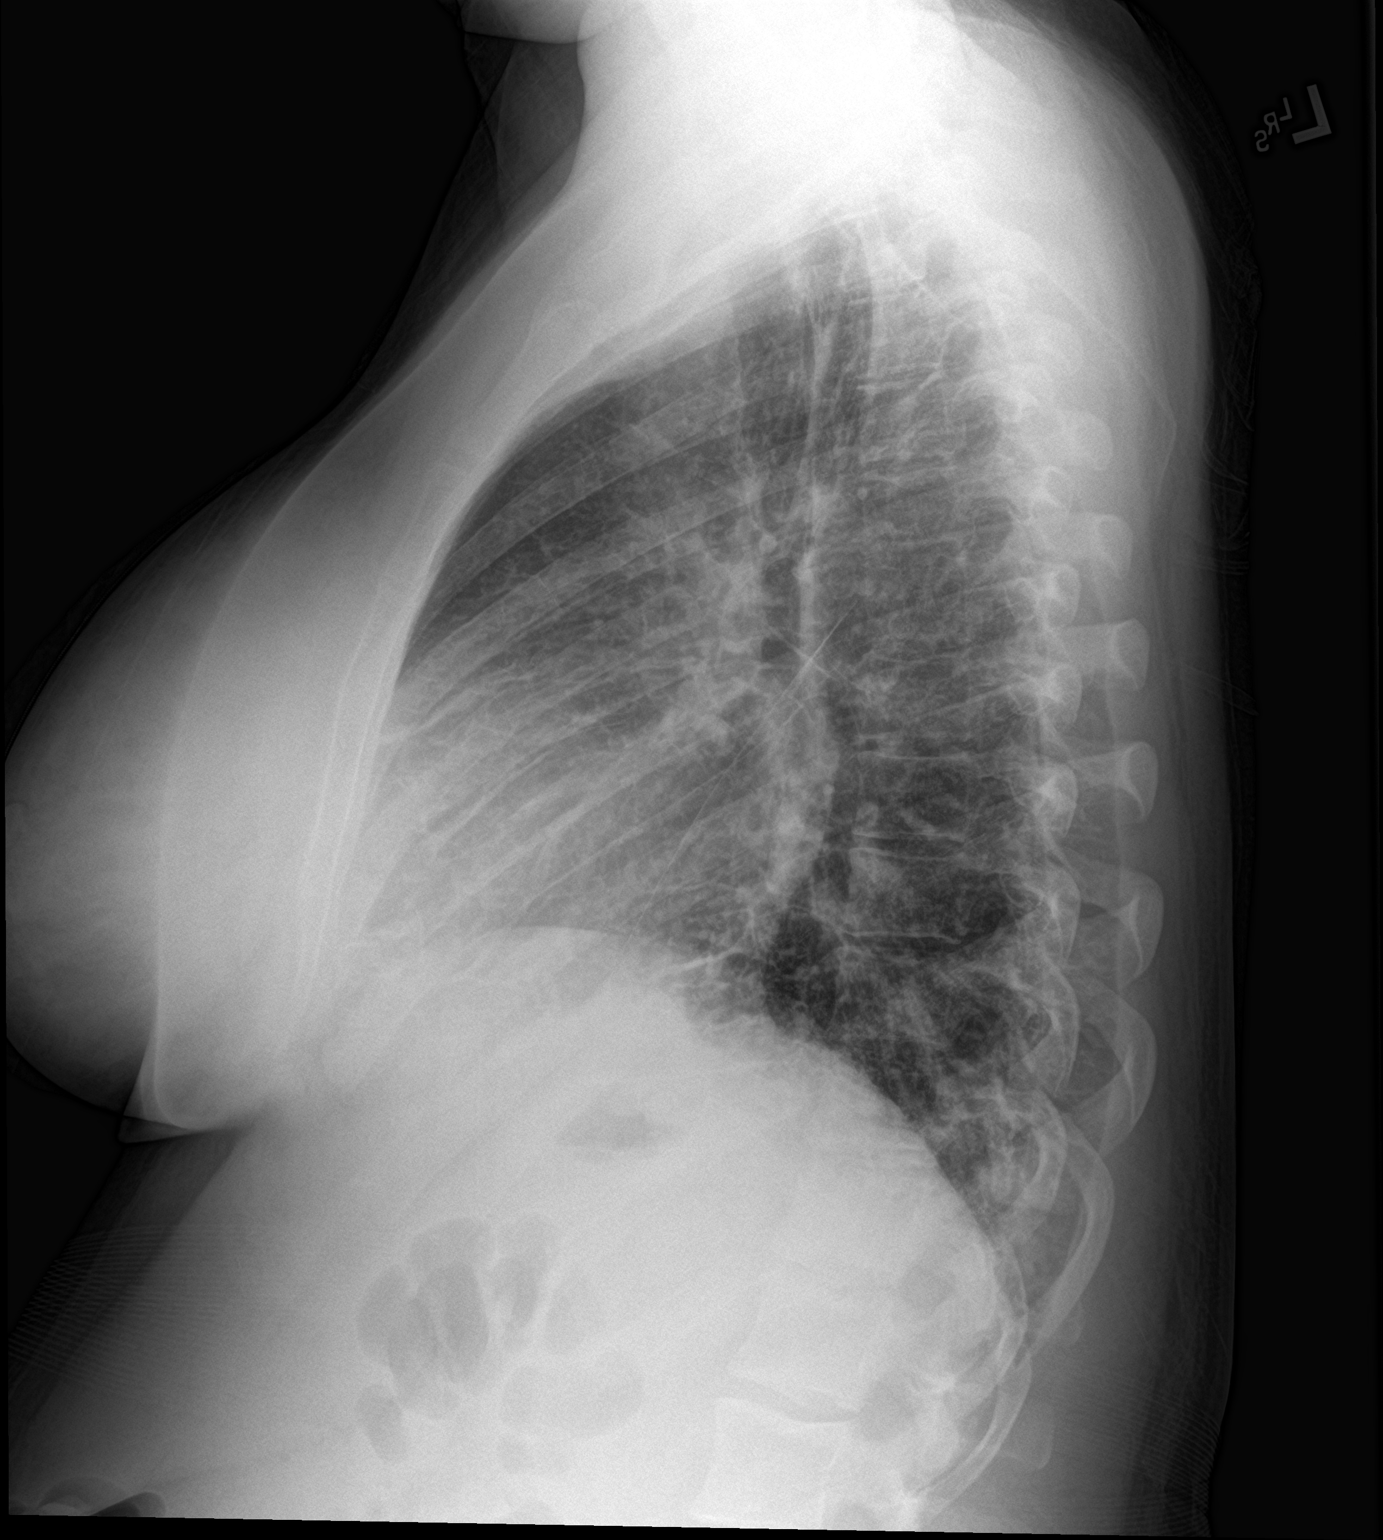

[2 of 2 positions shown; findings below may reference images not displayed]

FINDINGS: Mild enlargement of cardiac silhouette and pulmonary vascular
congestion consistent with recent postpartum state.

Mediastinal contours normal.

Scattered interstitial infiltrates in the mid to lower lungs
bilaterally which could represent edema or infection.

No pleural effusion or pneumothorax.

Bones unremarkable.
IMPRESSION: Interstitial infiltrates in the mid to lower lungs which could
represent pulmonary edema or infection.

## 2020-07-13 ENCOUNTER — Ambulatory Visit (INDEPENDENT_AMBULATORY_CARE_PROVIDER_SITE_OTHER): Payer: Medicaid Other | Admitting: Certified Nurse Midwife

## 2020-07-13 ENCOUNTER — Other Ambulatory Visit: Payer: Self-pay

## 2020-07-13 ENCOUNTER — Encounter: Payer: Self-pay | Admitting: Certified Nurse Midwife

## 2020-07-13 VITALS — BP 104/73 | HR 85 | Ht 61.5 in | Wt 142.8 lb

## 2020-07-13 DIAGNOSIS — Z3046 Encounter for surveillance of implantable subdermal contraceptive: Secondary | ICD-10-CM

## 2020-07-13 DIAGNOSIS — Z3042 Encounter for surveillance of injectable contraceptive: Secondary | ICD-10-CM

## 2020-07-13 MED ORDER — MEDROXYPROGESTERONE ACETATE 150 MG/ML IM SUSP
150.0000 mg | Freq: Once | INTRAMUSCULAR | Status: AC
  Administered 2020-07-13: 150 mg via INTRAMUSCULAR

## 2020-07-13 NOTE — Progress Notes (Signed)
Date last pap: 04/21/2020 Last Depo-Provera: n/a Side Effects if any: n/a Serum HCG indicated? n/a. Depo-Provera 150 mg IM given by: Darrold Junker, CMA Next appointment due: 09/28/20-10/12/20

## 2020-07-13 NOTE — Patient Instructions (Signed)
Nexplanon Instructions After Insertion/Removal   Keep bandage clean and dry for 24 hours   May use ice/Tylenol/Ibuprofen for soreness or pain   If you develop fever, drainage or increased warmth from incision site-contact office immediately  Medroxyprogesterone injection [Contraceptive] What is this medicine? MEDROXYPROGESTERONE (me DROX ee proe JES te rone) contraceptive injections prevent pregnancy. They provide effective birth control for 3 months. Depo-subQ Provera 104 is also used for treating pain related to endometriosis. This medicine may be used for other purposes; ask your health care provider or pharmacist if you have questions. COMMON BRAND NAME(S): Depo-Provera, Depo-subQ Provera 104 What should I tell my health care provider before I take this medicine? They need to know if you have any of these conditions:  frequently drink alcohol  asthma  blood vessel disease or a history of a blood clot in the lungs or legs  bone disease such as osteoporosis  breast cancer  diabetes  eating disorder (anorexia nervosa or bulimia)  high blood pressure  HIV infection or AIDS  kidney disease  liver disease  mental depression  migraine  seizures (convulsions)  stroke  tobacco smoker  vaginal bleeding  an unusual or allergic reaction to medroxyprogesterone, other hormones, medicines, foods, dyes, or preservatives  pregnant or trying to get pregnant  breast-feeding How should I use this medicine? Depo-Provera Contraceptive injection is given into a muscle. Depo-subQ Provera 104 injection is given under the skin. These injections are given by a health care professional. You must not be pregnant before getting an injection. The injection is usually given during the first 5 days after the start of a menstrual period or 6 weeks after delivery of a baby. Talk to your pediatrician regarding the use of this medicine in children. Special care may be needed. These  injections have been used in female children who have started having menstrual periods. Overdosage: If you think you have taken too much of this medicine contact a poison control center or emergency room at once. NOTE: This medicine is only for you. Do not share this medicine with others. What if I miss a dose? Try not to miss a dose. You must get an injection once every 3 months to maintain birth control. If you cannot keep an appointment, call and reschedule it. If you wait longer than 13 weeks between Depo-Provera contraceptive injections or longer than 14 weeks between Depo-subQ Provera 104 injections, you could get pregnant. Use another method for birth control if you miss your appointment. You may also need a pregnancy test before receiving another injection. What may interact with this medicine? Do not take this medicine with any of the following medications:  bosentan This medicine may also interact with the following medications:  aminoglutethimide  antibiotics or medicines for infections, especially rifampin, rifabutin, rifapentine, and griseofulvin  aprepitant  barbiturate medicines such as phenobarbital or primidone  bexarotene  carbamazepine  medicines for seizures like ethotoin, felbamate, oxcarbazepine, phenytoin, topiramate  modafinil  St. John's wort This list may not describe all possible interactions. Give your health care provider a list of all the medicines, herbs, non-prescription drugs, or dietary supplements you use. Also tell them if you smoke, drink alcohol, or use illegal drugs. Some items may interact with your medicine. What should I watch for while using this medicine? This drug does not protect you against HIV infection (AIDS) or other sexually transmitted diseases. Use of this product may cause you to lose calcium from your bones. Loss of calcium may cause weak   bones (osteoporosis). Only use this product for more than 2 years if other forms of birth  control are not right for you. The longer you use this product for birth control the more likely you will be at risk for weak bones. Ask your health care professional how you can keep strong bones. You may have a change in bleeding pattern or irregular periods. Many females stop having periods while taking this drug. If you have received your injections on time, your chance of being pregnant is very low. If you think you may be pregnant, see your health care professional as soon as possible. Tell your health care professional if you want to get pregnant within the next year. The effect of this medicine may last a long time after you get your last injection. What side effects may I notice from receiving this medicine? Side effects that you should report to your doctor or health care professional as soon as possible:  allergic reactions like skin rash, itching or hives, swelling of the face, lips, or tongue  breast tenderness or discharge  breathing problems  changes in vision  depression  feeling faint or lightheaded, falls  fever  pain in the abdomen, chest, groin, or leg  problems with balance, talking, walking  unusually weak or tired  yellowing of the eyes or skin Side effects that usually do not require medical attention (report to your doctor or health care professional if they continue or are bothersome):  acne  fluid retention and swelling  headache  irregular periods, spotting, or absent periods  temporary pain, itching, or skin reaction at site where injected  weight gain This list may not describe all possible side effects. Call your doctor for medical advice about side effects. You may report side effects to FDA at 1-800-FDA-1088. Where should I keep my medicine? This does not apply. The injection will be given to you by a health care professional. NOTE: This sheet is a summary. It may not cover all possible information. If you have questions about this  medicine, talk to your doctor, pharmacist, or health care provider.  2020 Elsevier/Gold Standard (2008-08-08 18:37:56)  

## 2020-07-13 NOTE — Progress Notes (Signed)
EYDIE WORMLEY is a 24 y.o. year old Caucasian female here for Nexplanon removal.  Patient given informed consent for removal of her Nexplanon.  BP 104/73   Pulse 85   Ht 5' 1.5" (1.562 m)   Wt 142 lb 12.8 oz (64.8 kg)   LMP 07/05/2020   Breastfeeding Yes   BMI 26.54 kg/m   Appropriate time out taken. Implanon site identified.  Area prepped in usual sterile fashon. One cc of 2% lidocaine was used to anesthetize the area at the distal end of the implant. A small stab incision was made right beside the implant on the distal portion.  The Implanon rod was grasped using hemostats and removed without difficulty.  There was less than 3 cc blood loss. There were no complications.  Steri-strips were applied over the small incision and a pressure bandage was applied.  The patient tolerated the procedure well.  She was instructed to keep the area clean and dry, remove pressure bandage in 24 hours, and keep insertion site covered with the steri-strip for 3-5 days.    Rx: Depo, see orders. First injection given today, see chart.   Reviewed red flag symptoms and when to call.   RTC x 3 months for depo injection or sooner if needed.    Serafina Royals, CNM [facility], [facility] 07/13/20 10:49 AM

## 2020-10-07 ENCOUNTER — Encounter: Payer: Self-pay | Admitting: Certified Nurse Midwife

## 2020-10-08 ENCOUNTER — Telehealth: Payer: Self-pay

## 2020-10-08 ENCOUNTER — Other Ambulatory Visit: Payer: Self-pay

## 2020-10-08 MED ORDER — MEDROXYPROGESTERONE ACETATE 150 MG/ML IM SUSP
150.0000 mg | INTRAMUSCULAR | 1 refills | Status: DC
Start: 2020-10-08 — End: 2021-03-31

## 2020-10-08 NOTE — Telephone Encounter (Signed)
Called pt informed depo sent to pharmacy and to bring with her to appt tomorrow.  Reminded of appt time.  PB

## 2020-10-09 ENCOUNTER — Other Ambulatory Visit: Payer: Self-pay

## 2020-10-09 ENCOUNTER — Encounter: Payer: Self-pay | Admitting: Certified Nurse Midwife

## 2020-10-09 ENCOUNTER — Other Ambulatory Visit (HOSPITAL_COMMUNITY)
Admission: RE | Admit: 2020-10-09 | Discharge: 2020-10-09 | Disposition: A | Discharge status: Home / Self Care | Payer: Medicaid Other | Source: Ambulatory Visit | Attending: Certified Nurse Midwife | Admitting: Certified Nurse Midwife

## 2020-10-09 ENCOUNTER — Ambulatory Visit (INDEPENDENT_AMBULATORY_CARE_PROVIDER_SITE_OTHER): Payer: Medicaid Other | Admitting: Certified Nurse Midwife

## 2020-10-09 VITALS — BP 129/85 | HR 66 | Ht 61.5 in | Wt 139.2 lb

## 2020-10-09 DIAGNOSIS — Z3042 Encounter for surveillance of injectable contraceptive: Secondary | ICD-10-CM | POA: Diagnosis not present

## 2020-10-09 DIAGNOSIS — Z840 Family history of diseases of the skin and subcutaneous tissue: Secondary | ICD-10-CM | POA: Diagnosis not present

## 2020-10-09 DIAGNOSIS — N921 Excessive and frequent menstruation with irregular cycle: Secondary | ICD-10-CM

## 2020-10-09 MED ORDER — MEDROXYPROGESTERONE ACETATE 150 MG/ML IM SUSP
150.0000 mg | Freq: Once | INTRAMUSCULAR | Status: AC
  Administered 2020-10-09: 150 mg via INTRAMUSCULAR

## 2020-10-09 NOTE — Addendum Note (Signed)
Addended by: Shaune Spittle on: 10/09/2020 01:16 PM   Modules accepted: Orders

## 2020-10-09 NOTE — Progress Notes (Signed)
C/o vaginal bleeding after sex, bowel movements and random times.  Hasn't stopped since giving birth.  Wants to discuss Lupus due to family hx.  Would like to discuss other options due to constant bleeding (patch)

## 2020-10-09 NOTE — Progress Notes (Signed)
GYN ENCOUNTER NOTE  Subjective:       Tamara Bishop is a 25 y.o. [phone] female is here for gynecologic evaluation of the following issues:  1. Breakthrough bleeding on Depo-provera; reports random bleeding, most noticeable after intercourse or bowel movements 2. Questions Lupus due to family history  Denies difficulty breathing or respiratory distress, chest pain, abdominal pain, excessive vaginal bleeding, dysuria and leg pain or swelling.    Gynecologic History  No LMP recorded. (Menstrual status: Lactating).  Contraception: Depo-Provera injections  Last Pap: 04/2020. Results were: normal  Obstetric History  OB History  Gravida Para Term Preterm AB Living  2 2 2  0 0 2  SAB IAB Ectopic Multiple Live Births  0 0 0 0 2    # Outcome Date GA Lbr Len/2nd Weight Sex Delivery Anes PTL Lv  2 Term 01/16/20 [redacted]w[redacted]d / 00:40 7 lb 7.9 oz (3.4 kg) M Vag-Spont EPI  LIV  1 Term 10/17/18 [redacted]w[redacted]d / 00:46 6 lb 8.8 oz (2.97 kg) F Vag-Spont EPI  LIV     Complications: Preeclampsia    Past Medical History:  Diagnosis Date  . Abnormal uterine bleeding   . Adjustment disorder with anxiety   . Allergic rhinitis   . Anemia   . Anxiety   . Cigarette smoker   . Depression   . Hypertension   . Insomnia   . Tobacco abuse     Past Surgical History:  Procedure Laterality Date  . NO PAST SURGERIES    . WISDOM TOOTH EXTRACTION      Current Outpatient Medications on File Prior to Visit  Medication Sig Dispense Refill  . escitalopram (LEXAPRO) 20 MG tablet Take 1 tablet (20 mg total) by mouth daily. 90 tablet 1  . medroxyPROGESTERone (DEPO-PROVERA) 150 MG/ML injection Inject 1 mL (150 mg total) into the muscle every 3 (three) months. 1 mL 1   No current facility-administered medications on file prior to visit.    Allergies  Allergen Reactions  . Azithromycin Other (See Comments)    Felt like her stomach was going to come out of her body    Social History   Socioeconomic History  .  Marital status: Significant Other    Spouse name: [redacted]w[redacted]d  . Number of children: Not on file  . Years of education: Not on file  . Highest education level: Not on file  Occupational History  . Not on file  Tobacco Use  . Smoking status: Former Smoker    Packs/day: 0.50    Years: 1.00    Pack years: 0.50    Types: Cigarettes  . Smokeless tobacco: Never Used  Vaping Use  . Vaping Use: Never used  Substance and Sexual Activity  . Alcohol use: No  . Drug use: No  . Sexual activity: Yes    Birth control/protection: Injection    Comment: depo  Other Topics Concern  . Not on file  Social History Narrative  . Not on file   Social Determinants of Health   Financial Resource Strain: Not on file  Food Insecurity: Not on file  Transportation Needs: Not on file  Physical Activity: Not on file  Stress: Not on file  Social Connections: Not on file  Intimate Partner Violence: Not on file    Family History  Problem Relation Age of Onset  . Lupus Mother   . Diabetes Father   . Breast cancer Maternal Grandmother   . Cancer Maternal Grandmother  breast  . Diabetes Paternal Grandmother   . Diabetes Paternal Grandfather   . Lupus Sister   . Heart disease Maternal Grandfather   . Ovarian cancer Neg Hx   . Colon cancer Neg Hx     The following portions of the patient's history were reviewed and updated as appropriate: allergies, current medications, past family history, past medical history, past social history, past surgical history and problem list.  Review of Systems  ROS negative except as noted above. Information obtained from patient.   Objective:   BP 129/85   Pulse 66   Ht 5' 1.5" (1.562 m)   Wt 139 lb 3.2 oz (63.1 kg)   Breastfeeding Yes   BMI 25.88 kg/m    CONSTITUTIONAL: Well-developed, well-nourished female in no acute distress.   PELVIC:  External Genitalia: Normal  Vagina: Normal  Cervix: Normal, swab collect, single pea sized blood clot at  os  MUSCULOSKELETAL: Normal range of motion. No tenderness.  No cyanosis, clubbing, or edema.  Assessment:   1. Lactating mother   2. Encounter for Depo-Provera contraception   3. Breakthrough bleeding on depo provera   4. Family history of lupus erythematosus  - Ambulatory referral to Family Practice     Plan:   Vaginal swab collected, see orders.   Discussed irregular bleeding associated with Depo-provera, patient verbalizes understanding and wishes to continue at this time.   Depo injection today, see chart.   Referral to [facility], see orders.   Reviewed red flag symptoms and when to call.   RTC as previously scheduled or sooner if needed.    Serafina Royals, CNM [facility], [facility] 10/09/20 1:13 PM

## 2020-10-09 NOTE — Patient Instructions (Signed)
Contraceptive Injection A contraceptive injection is a shot that prevents pregnancy. It is also called a birth control shot. The shot contains the hormone progestin, which prevents pregnancy by:  Stopping the ovaries from releasing eggs.  Thickening cervical mucus to prevent sperm from entering the cervix.  Thinning the lining of the uterus to prevent a fertilized egg from attaching to the uterus. Contraceptive injections are given under the skin (subcutaneous) or into a muscle (intramuscular). For these shots to work, you must get one of them every 3 months (12-13 weeks) from a health care provider. Tell a health care provider about:  Any allergies you have.  All medicines you are taking, including vitamins, herbs, eye drops, creams, and over-the-counter medicines.  Any blood disorders you have.  Any medical conditions you have.  Whether you are pregnant or may be pregnant. What are the risks? Generally, this is a safe procedure. However, problems may occur, including:  Mood changes or depression.  Loss of bone density (osteoporosis) after long-term use.  Blood clots. This is rare.  Higher risk of an egg being fertilized outside your uterus (ectopic pregnancy).This is rare. What happens before the procedure?  Your health care provider may do a routine physical exam.  You may have a test to make sure you are not pregnant. What happens during the procedure?  The area where the shot will be given will be cleaned and sanitized with alcohol.  A needle will be inserted into a muscle in your upper arm or buttock, or into the skin of your thigh or abdomen. The needle will be attached to a syringe with the medicine inside of it.  The medicine will be pushed through the syringe and injected into your body.  A small bandage (dressing) may be placed over the injection site.   What can I expect after the procedure?  After the procedure, it is common to have: ? Soreness around the  injection site for a couple of days. ? Irregular menstrual bleeding. ? Weight gain. ? Breast tenderness. ? Headaches. ? Discomfort in your abdomen.  Ask your health care provider whether you need to use an added method of birth control (backup contraception), such as a condom, sponge, or spermicide. ? If the first shot is given 1-7 days after the start of your last menstrual period, you will not need backup contraception. ? If the first shot is given at any other time during your menstrual cycle, you should avoid having sex. If you do have sex, you will need to use backup contraception for 7 days after you receive the shot. Follow these instructions at home: General instructions  Take over-the-counter and prescription medicines only as told by your health care provider.  Do not rub or massage the injection site.  Track your menstrual periods so you will know if they become irregular.  Always use a condom to protect against sexually transmitted infections (STIs).  Make sure you schedule an appointment in time for your next shot and mark it on your calendar. You must get an injection every 3 months (12-13 weeks) to prevent pregnancy. Lifestyle  Do not use any products that contain nicotine or tobacco. These products include cigarettes, chewing tobacco, and vaping devices, such as e-cigarettes. If you need help quitting, ask your health care provider.  Eat foods that are high in calcium and vitamin D, such as milk, cheese, and salmon. Doing this may help with any loss in bone density caused by the contraceptive injection. Ask your  health care provider for dietary recommendations. Contact a health care provider if you:  Have nausea or vomiting.  Have abnormal vaginal discharge or bleeding.  Miss a menstrual period or think you might be pregnant.  Experience mood changes or depression.  Feel dizzy or light-headed.  Have leg pain. Get help right away if you:  Have chest pain or  cough up blood.  Have shortness of breath.  Have a severe headache that does not go away.  Have numbness in any part of your body.  Have slurred speech or vision problems.  Have vaginal bleeding that is abnormally heavy or does not stop, or you have severe pain in your abdomen.  Have depression that does not get better with treatment. If you ever feel like you may hurt yourself or others, or have thoughts about taking your own life, get help right away. Go to your nearest emergency department or:  Call your local emergency services (911 in the [place]  Call a suicide crisis helpline, such as the National Suicide Prevention Lifeline at 1-800-273-8255. This is open 24 hours a day in the [place]  Text the Crisis Text Line at 741741 (in the [place] Summary  A contraceptive injection is a shot that prevents pregnancy. It is also called the birth control shot.  The shot is given under the skin (subcutaneous) or into a muscle (intramuscular).  After this procedure, it is common to have soreness around the injection site for a couple of days.  To prevent pregnancy, the shot must be given by a health care provider every 3 months (12-13 weeks).  After you have the shot, ask your health care provider whether you need to use an added method of birth control (backup contraception), such as a condom, sponge, or spermicide. This information is not intended to replace advice given to you by your health care provider. Make sure you discuss any questions you have with your health care provider. Document Revised: 01/27/2020 Document Reviewed: 01/27/2020 Elsevier Patient Education  2021 [employer]  

## 2020-10-13 ENCOUNTER — Ambulatory Visit: Payer: Medicaid Other

## 2020-10-13 LAB — CERVICOVAGINAL ANCILLARY ONLY
Bacterial Vaginitis (gardnerella): NEGATIVE
Candida Glabrata: NEGATIVE
Candida Vaginitis: NEGATIVE
Chlamydia: NEGATIVE
Comment: NEGATIVE
Comment: NEGATIVE
Comment: NEGATIVE
Comment: NEGATIVE
Comment: NEGATIVE
Comment: NORMAL
Neisseria Gonorrhea: NEGATIVE
Trichomonas: NEGATIVE

## 2020-10-15 ENCOUNTER — Ambulatory Visit: Payer: Medicaid Other

## 2021-01-11 ENCOUNTER — Other Ambulatory Visit: Payer: Self-pay

## 2021-01-11 ENCOUNTER — Ambulatory Visit (INDEPENDENT_AMBULATORY_CARE_PROVIDER_SITE_OTHER): Payer: Medicaid Other

## 2021-01-11 VITALS — Ht 61.5 in

## 2021-01-11 DIAGNOSIS — Z3042 Encounter for surveillance of injectable contraceptive: Secondary | ICD-10-CM | POA: Diagnosis not present

## 2021-01-11 MED ORDER — MEDROXYPROGESTERONE ACETATE 150 MG/ML IM SUSP
150.0000 mg | Freq: Once | INTRAMUSCULAR | Status: AC
  Administered 2021-01-11: 150 mg via INTRAMUSCULAR

## 2021-01-11 NOTE — Progress Notes (Signed)
Date last pap  04/21/2020. Last Depo-Provera: 10/09/2020. Side Effects if any none. Serum HCG indicated? N/a. Depo-Provera 150 mg IM given by: FHampton, LPN. Next appointment due August 29-Sept 12, 2022.   Pt provided depo injection.

## 2021-02-15 ENCOUNTER — Other Ambulatory Visit: Payer: Self-pay | Admitting: Certified Nurse Midwife

## 2021-02-15 DIAGNOSIS — Z840 Family history of diseases of the skin and subcutaneous tissue: Secondary | ICD-10-CM

## 2021-03-21 NOTE — Progress Notes (Signed)
Office Visit Note  Patient: Tamara Bishop             Date of Birth: [birth year]           MRN: [id number]             PCP: Philip Aspen, CNM Referring: Philip Aspen, CNM Visit Date: 03/22/2021  Subjective:  New Patient (Initial Visit) (Memory issues, bil hand and arm pain, chest pain, patient has not had COVID vaccines, swollen glands)   History of Present Illness: Tamara Bishop is a 25 y.o. female here for evaluation of numerous symptoms with concern for lupus. She has a family history of lupus with her mother who died at age 69 and her sister aged 44 living in [place].  She described longstanding symptoms starting since around age 72 but mostly noticeable and with significant more severity since her 2 pregnancies for children aged 54 and 2.  She reports chronic fatigue and poor sleep waking up approximately every 1 hour during the night with difficulty returning to sleep due to chest wall pain lying in flat positions and frequent urination.  She has worsening difficulty with concentration and memory feeling that her mental abilities are impaired compared to usual including routine tasks and remembering lists.  She has joint pain in multiple sites particularly in the bilateral hands and forearms as well as the chest wall pain.  She does not typically notice large amounts of swelling but does sometimes see pallor or purple discoloration in her hands and toes associated with numbness and tingling.  The chest wall pain usually worsens on deep inspiration also cannot lie flat on her back without worsening.  She describes skin rashes breaking out over both cheeks and on portions of her distal forearms with sun exposure lasting up to 1 week after onset.  She also has some chronic erythematous patches over the elbows apparently eczema.  She has chronic allergic rhinitis symptoms also breaking out in itchy urticarial rashes without known specific triggers these resolved without residual  hyperpigmentation. She notices intermittently painful ulcers on the sides of the tongue and on buccal mucosa bilaterally. Chronic intermittent lymphadenopathy involving both sides of the neck without much tenderness.  She also feels there is some spontaneous bruising affecting her thighs and toes on the right foot.  Her pregnancies were 2 full-term did have preeclampsia complications but without history of miscarriages.  Activities of Daily Living:  Patient reports morning stiffness for 1 hour.   Patient Reports nocturnal pain.  Difficulty dressing/grooming: Denies Difficulty climbing stairs: Reports Difficulty getting out of chair: Denies Difficulty using hands for taps, buttons, cutlery, and/or writing: Reports  Review of Systems  Constitutional:  Positive for fatigue.  HENT:  Positive for mouth sores and mouth dryness.   Eyes:  Positive for dryness.  Respiratory:  Positive for shortness of breath.   Cardiovascular:  Negative for swelling in legs/feet.  Gastrointestinal:  Positive for constipation and diarrhea.  Endocrine: Positive for cold intolerance, excessive thirst and increased urination.  Genitourinary:  Negative for difficulty urinating.  Musculoskeletal:  Positive for joint pain, joint pain, muscle weakness, morning stiffness and muscle tenderness.  Skin:  Positive for rash, nodules/bumps and sensitivity to sunlight.  Allergic/Immunologic: Positive for susceptible to infections.  Neurological:  Positive for dizziness, numbness and weakness.  Hematological:  Positive for bruising/bleeding tendency.  Psychiatric/Behavioral:  Positive for sleep disturbance.    PMFS History:  Patient Active Problem List   Diagnosis Date Noted   Rash  and other nonspecific skin eruption 03/22/2021   Lymphadenopathy 03/22/2021   Arthralgia 03/22/2021   Bacterial vaginosis    Shortness of breath    History of severe pre-eclampsia    Vitamin D deficiency 10/16/2018   B12 deficiency 10/16/2018    Pregnancy 10/14/2018   Low grade 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) 05/01/2018   History of marijuana use 04/10/2018   Acute pharyngitis 07/13/2015   Dyspepsia 06/02/2015   Family history of lupus erythematosus 06/02/2015   Adjustment disorder with anxiety    Allergic rhinitis    Insomnia    Anxiety and depression 02/16/2015    Past Medical History:  Diagnosis Date   Abnormal uterine bleeding    Adjustment disorder with anxiety    Allergic rhinitis    Anemia    Anxiety    Cigarette smoker    Depression    Hypertension    Insomnia    Tobacco abuse     Family History  Problem Relation Age of Onset   Lupus Mother    Diabetes Father    Breast cancer Maternal Grandmother    Cancer Maternal Grandmother         breast   Diabetes Paternal Grandmother    Diabetes Paternal Grandfather    Lupus Sister    Heart disease Maternal Grandfather    Ovarian cancer Neg Hx    Colon cancer Neg Hx    Past Surgical History:  Procedure Laterality Date   NO PAST SURGERIES     WISDOM TOOTH EXTRACTION     Social History   Social History Narrative   Not on file   Immunization History  Administered Date(s) Administered   Influenza,inj,Quad PF,6+ Mos 06/02/2015, 05/21/2018, 04/16/2019   Tdap 05/09/2014, 08/17/2018     Objective: Vital Signs: BP 105/67 (BP Location: Right Arm, Patient Position: Sitting, Cuff Size: Normal)   Pulse 66   Resp 14   Ht 5' 2.5" (1.588 m)   Wt 128 lb (58.1 kg)   Breastfeeding No   BMI 23.04 kg/m    Physical Exam HENT:     Right Ear: External ear normal.     Left Ear: External ear normal.     Mouth/Throat:     Mouth: Mucous membranes are moist.     Pharynx: Oropharynx is clear.  Eyes:     Conjunctiva/sclera: Conjunctivae normal.  Cardiovascular:     Rate and Rhythm: Normal rate and regular rhythm.  Pulmonary:     Effort: Pulmonary effort is normal.     Breath sounds: Normal breath sounds.  Skin:    General: Skin is  warm and dry.     Comments: Faint erythematous patches on bilateral elbows Some nailfold capillaries appear tortuous on fingers more on left, no nail or digital pitting  Neurological:     General: No focal deficit present.     Mental Status: She is alert.     Deep Tendon Reflexes: Reflexes normal.  Psychiatric:        Mood and Affect: Mood normal.  Right anterior cervical lymph nodes enlarged less than 1 cm diameter, mobile, nontender  Musculoskeletal Exam:  Neck full ROM no tenderness Shoulders full ROM no tenderness or swelling Elbows full ROM no tenderness or swelling Wrists full ROM no tenderness or swelling Fingers full ROM no tenderness or swelling Diffuse tenderness to pressure over lumbar spine and paraspinal muscles Knees full ROM no tenderness or swelling Ankles full ROM no tenderness or swelling   Investigation:  No additional findings.  Imaging: No results found.  Recent Labs: Lab Results  Component Value Date   WBC 7.6 03/22/2021   HGB 13.4 03/22/2021   PLT 214 03/22/2021   NA 140 01/15/2020   K 3.9 01/15/2020   CL 106 01/15/2020   CO2 20 01/15/2020   GLUCOSE 74 01/15/2020   BUN 6 01/15/2020   CREATININE 0.65 01/15/2020   BILITOT 0.2 01/15/2020   ALKPHOS 275 (H) 01/15/2020   AST 18 01/15/2020   ALT 7 01/15/2020   PROT 5.7 (L) 01/15/2020   ALBUMIN 3.3 (L) 01/15/2020   CALCIUM 9.2 01/15/2020   GFRAA 145 01/15/2020    Speciality Comments: No specialty comments available.  Procedures:  No procedures performed Allergies: Azithromycin   Assessment / Plan:     Visit Diagnoses: Arthralgia, unspecified joint - Plan: ANA, Sedimentation rate, CBC with Differential/Platelet  Diffuse joint pains but no inflammation visible or palpable on exam today. Pain is actually worse of myofascial areas including the back and shoulders, although she describes typical proximal finger joints being often affected. Will check ESR for evidence of inflammation, ANA screening,  and CBC previous had abnormal thrombocytopenia but was around the time of her pregnancy with multiple abnormal liver functions. Several reported symptoms including oral ulcers, photosensitive skin rash, lymphadenopathy, pleuritic sounding chest pain but not obvious on current exam.  Family history of lupus erythematosus - Plan: ANA, Sedimentation rate, CBC with Differential/Platelet  Strong family history apparently mother deceased at age 17 unclear if directly associated with lupus diagnosis though. Checking ANA screening as above also CBC for any evidence.  Rash and other nonspecific skin eruption - Plan: ANA, Sedimentation rate, CBC with Differential/Platelet  Skin rash describes over central facial distribution but not present today any may be rosacea or alternative. Not strictly photosensitive although her reported clinical history is suspicious..  Lymphadenopathy   Currently 1 palpable node <1cm diameter and firm and mobile low suspicion can recheck in f/u.  Vitamin D deficiency - Plan: VITAMIN D 25 Hydroxy (Vit-D Deficiency, Fractures)  Checking vitamin D level with sun exposure avoidance and protection commonly associated and can worsen skin disease activity.  Orders: Orders Placed This Encounter  Procedures   ANA   Sedimentation rate   CBC with Differential/Platelet   VITAMIN D 25 Hydroxy (Vit-D Deficiency, Fractures)   No orders of the defined types were placed in this encounter.    Follow-Up Instructions: Return in about 2 weeks (around 04/05/2021) for New pt f/u 2wks.   Collier Salina, MD  Note - This record has been created using [employer].  Chart creation errors have been sought, but may not always  have been located. Such creation errors do not reflect on  the standard of medical care.

## 2021-03-22 ENCOUNTER — Encounter: Payer: Self-pay | Admitting: Internal Medicine

## 2021-03-22 ENCOUNTER — Other Ambulatory Visit: Payer: Self-pay

## 2021-03-22 ENCOUNTER — Ambulatory Visit (INDEPENDENT_AMBULATORY_CARE_PROVIDER_SITE_OTHER): Payer: Medicaid Other | Admitting: Internal Medicine

## 2021-03-22 VITALS — BP 105/67 | HR 66 | Resp 14 | Ht 62.5 in | Wt 128.0 lb

## 2021-03-22 DIAGNOSIS — E559 Vitamin D deficiency, unspecified: Secondary | ICD-10-CM

## 2021-03-22 DIAGNOSIS — M2550 Pain in unspecified joint: Secondary | ICD-10-CM | POA: Insufficient documentation

## 2021-03-22 DIAGNOSIS — F32A Depression, unspecified: Secondary | ICD-10-CM

## 2021-03-22 DIAGNOSIS — F419 Anxiety disorder, unspecified: Secondary | ICD-10-CM | POA: Diagnosis not present

## 2021-03-22 DIAGNOSIS — R591 Generalized enlarged lymph nodes: Secondary | ICD-10-CM

## 2021-03-22 DIAGNOSIS — R21 Rash and other nonspecific skin eruption: Secondary | ICD-10-CM

## 2021-03-22 DIAGNOSIS — Z840 Family history of diseases of the skin and subcutaneous tissue: Secondary | ICD-10-CM

## 2021-03-22 DIAGNOSIS — J309 Allergic rhinitis, unspecified: Secondary | ICD-10-CM | POA: Diagnosis not present

## 2021-03-22 NOTE — Patient Instructions (Signed)
I am checking several tests for evidence of autoimmune disease and inflammation.  Antinuclear Antibody Test Why am I having this test? This is a test that is used to help diagnose systemic lupus erythematosus (SLE) and other autoimmune diseases. An autoimmune disease is a disease in which the body's own defense (immune)system attacks its organs. What is being tested? This test checks for antinuclear antibodies (ANA) in the blood. The presence of ANA is associated with several autoimmune diseases. It is seen in almost allpatients with lupus. What kind of sample is taken?  A blood sample is required for this test. It is usually collected by insertinga needle into a blood vessel. How are the results reported? Your test results will be reported as either positive or negative. A false-positive result can occur. A false positive is incorrect because itmeans that a condition is present when it is not. What do the results mean? A positive test result may mean that you have: Lupus. Other autoimmune diseases, such as rheumatoid arthritis, scleroderma, or Sjgren syndrome. Conditions that may cause a false-positive result include: Liver dysfunction. Myasthenia gravis. Infectious mononucleosis. Talk with your health care provider about what your results mean. Questions to ask your health care provider Ask your health care provider, or the department that is doing the test: When will my results be ready? How will I get my results? What are my treatment options? What other tests do I need? What are my next steps? Summary This is a test that is used to help diagnose systemic lupus erythematosus (SLE) and other autoimmune diseases. An autoimmune disease is a disease in which the body's own defense (immune)system attacks the body. This test checks for antinuclear antibodies (ANA) in the blood. The presence of ANA is associated with several autoimmune diseases. It is seen in almost all patients with  lupus. Your test results will be reported as either positive or negative. Talk with your health care provider about what your results mean.  Erythrocyte Sedimentation Rate Test Why am I having this test? The erythrocyte sedimentation rate (ESR) test is used to help find illnesses related to: Sudden (acute) or long-term (chronic) infections. Inflammation. The body's disease-fighting system attacking healthy cells (autoimmune diseases). Cancer. Tissue death. If you have symptoms that may be related to any of these illnesses, your health care provider may do an ESR test before doing more specific tests. If you have an inflammatory immune disease, such as rheumatoid arthritis, you may have thistest to help monitor your therapy. What is being tested? This test measures how long it takes for your red blood cells (erythrocytes) to settle in a solution over a certain amount of time (sedimentation rate). When you have an infection or inflammation, your red blood cells clump together and settle faster. The sedimentation rate provides information abouthow much inflammation is present in the body. What kind of sample is taken?  A blood sample is required for this test. It is usually collected by insertinga needle into a blood vessel. How do I prepare for this test? Follow any instructions from your health care provider about changing orstopping your regular medicines. Tell a health care provider about: Any allergies you have. All medicines you are taking, including vitamins, herbs, eye drops, creams, and over-the-counter medicines. Any blood disorders you have. Any surgeries you have had. Any medical conditions you have, such as thyroid or kidney disease. Whether you are pregnant or may be pregnant. How are the results reported? Your results will be reported as a value  that measures sedimentation rate in millimeters per hour (mm/hr). Your health care provider will compare your results to normal ranges  that were established after testing a large group of people (reference values). Reference values may vary among labs and hospitals. For this test, common reference values, which vary by age and gender, are: Newborn: 0-2 mm/hr. Child, up to puberty: 0-10 mm/hr. Female: Under 50 years: 0-20 mm/hr. 50-85 years: 0-30 mm/hr. Over 85 years: 0-42 mm/hr. Female: Under 50 years: 0-15 mm/hr. 50-85 years: 0-20 mm/hr. Over 85 years: 0-30 mm/hr. Certain conditions or medicines may cause ESR levels to be falsely lower or higher, such as: Pregnancy. Obesity. Steroids, birth control pills, and blood thinners. Thyroid or kidney disease. What do the results mean? Results that are within reference values are considered normal, meaning that the level of inflammation in your body is healthy. High ESR levels mean that there is inflammation in your body. You will have more tests to help make adiagnosis. Inflammation may result from many different conditions or injuries. Talk with your health care provider about what your results mean. Questions to ask your health care provider Ask your health care provider, or the department that is doing the test: When will my results be ready? How will I get my results? What are my treatment options? What other tests do I need? What are my next steps? Summary The erythrocyte sedimentation rate (ESR) test is used to help find illnesses associated with sudden (acute) or long-term (chronic) infections, inflammation, autoimmune diseases, cancer, or tissue death. If you have symptoms that may be related to any of these illnesses, your health care provider may do an ESR test before doing more specific tests. If you have an inflammatory immune disease, such as rheumatoid arthritis, you may have this test to help monitor your therapy. This test measures how long it takes for your red blood cells (erythrocytes) to settle in a solution over a certain amount of time (sedimentation rate).  This provides information about how much inflammation is present in the body.  Complete Blood Count Why am I having this test? A complete blood count (CBC) is a blood test that measures the cells of your blood. The types of cells are red blood cells (RBCs), white blood cells (WBCs), and blood cell fragments that help with clotting (platelets). Changes in these cells can warn your health care provider about many conditions, including anemia, infection, inflammation, bleeding, andblood-related cancer. You may have this test: As part of a routine physical exam. To help your health care provider diagnose certain health conditions. To help your health care provider monitor known health conditions or treatments. What is being tested? A CBC measures your RBCs, WBCs, platelets, and their different components. RBCs are made in the tissue inside your bones (bone marrow) and released into your blood. These cells contain a protein that carries oxygen in your blood (hemoglobin). CBC testing of RBCs includes: RBC count. This is the total number of RBCs. Hemoglobin (Hb). This is the total amount of hemoglobin. Hematocrit (Hct). This is the percentage of space that red blood cells take up in your blood. Mean corpuscular volume (MCV). This is the average size of red blood cells. Mean corpuscular hemoglobin [facility]). This is the average amount of hemoglobin inside each red blood cell. Mean corpuscular hemoglobin concentration (MCHC). This is the average concentration of hemoglobin inside each red blood cell. Red blood cell distribution width (RDW). This may be included to measure the variation in the size of red  blood cells. WBCs are also made in your bone marrow. They are part of your body's disease-fighting system (immune system). CBC testing of WBCs includes: WBC count. This is the total number of WBCs. A count of the five types of WBCs: Neutrophils. Lymphocytes. Monocytes. Eosinophils. Basophils. Platelets  are cell fragments that are important for blood clotting. A CBC measures: Total platelet count. Mean platelet volume (MPV). What kind of sample is taken?  A blood sample is required for this test. It is usually collected by insertinga needle into a blood vessel. How are the results reported? Your test results will be reported as values. Your health care provider will compare your results to normal ranges that were established after testing a large group of people (reference ranges). Reference ranges may vary among labs and hospitals. Reference ranges for a CBC usually apply only to people who are older than 18. For this test, commonreference ranges for people older than 18 may be: Red blood cells RBC count Men: 4.7-6.1 Women: 4.2-5.4 Hb Men: 14-18 Women: 12-16 Hct Men: 42-52% Women: 37-47% MCV: 80-95 MCH: 27-31 MCHC: 32-36 RDW: 11-14.5% White blood cells WBC count: 5,000-10,000 Neutrophil count: 2500-8000 or 55-70% Lymphocyte count: 1000-4000 or 20-40% Monocyte count: 100-700 or 2-8% Eosinophil count: 50-500 or 1-4% Basophil count: 25-100 or 0.5-1% Platelets Platelet count: 150,000-400,000 MPV: 7.4-10.4 What do the results mean? Results that are outside the reference ranges may indicate that you have aninfection or other health condition. Red blood cells RBC count, Hb, and Hct Results lower than the reference ranges may indicate anemia. Anemia may be caused by several conditions, such as iron deficiency anemia. Results higher than the reference ranges may indicate polycythemia. This may be caused by several conditions, such as chronic obstructive pulmonary disease (COPD). MCV, MCH, MCHC, and RDW Results outside the reference ranges may indicate a condition that causes anemia. White blood cells WBC count Results lower than the reference range may indicate an infection or a condition that keeps your bone marrow from making new WBCs. Results higher than the reference range may  indicate an infection, a condition that causes inflammation (autoimmune disease), or a blood-related cancer. Neutrophils, lymphocytes, and monocytes Results outside the reference ranges may indicate an infection, an autoimmune disease, or certain types of cancer. Eosinophils and basophils Results outside the reference ranges may indicate an allergy, an inflammatory condition such as asthma, or blood cell cancer. Platelets Platelet count Results lower than the reference range may indicate an infection or blood cell cancer. Results higher than the reference range may indicate certain types of anemia, an autoimmune disease, or certain cancers. MPV High or low results may indicate a bone marrow abnormality. Talk with your health care provider about what your results mean. Questions to ask your health care provider Ask your health care provider or the department that is doing the test: When will my results be ready? How will I get my results? What are my treatment options? What other tests do I need? What are my next steps? Summary A CBC is a routine blood test that measures the cells in your blood. Changes in these cells can indicate many conditions, including anemia, infection, inflammation, and blood cell cancer. A health care provider will collect a sample of your blood for this test by inserting a needle into a blood vessel. Talk with your health care provider about what your results mean.  Vitamin D Test Why am I having this test? Vitamin D is a vitamin that your body makes  when you are exposed to sunlight. It is also found naturally in fish and eggs, and it is added to some foods, such as cereals and dairy products. You need vitamin D to maintain bone health and to support your disease-fighting (immune) system. You may have a vitamin D test: To help diagnose osteoporosis or other bone disorders. To monitor treatment of osteoporosis or other bone disorders. If you have symptoms of a  lack (deficiency) of vitamin D, such as experiencing broken bones from minor injuries. If you lack certain nutrients in your diet (dietary deficiencies). If you have problems absorbing nutrients during digestion. What is being tested? There are two types of vitamin D tests that measure slightly different things: The 25-hydroxyvitamin D test measures how much of a substance called 25-hydroxyvitamin D is in your blood. The body converts vitamin D to 25-hydroxyvitamin D in the liver. Measuring how much 25-hydroxyvitamin D is in your blood provides a good idea of your vitamin D levels. This is the most common type of vitamin D test. The 1,25-dihydroxyvitamin D test measures how much of a substance called 1,25-dihydroxyvitamin D is in your blood. The body converts vitamin D into this substance and then uses it for various functions. This test provides an idea of your total vitamin D levels. What kind of sample is taken?  A blood sample is required for this test. It is usually collected by insertinga needle into a blood vessel or by sticking a finger with a small needle. Tell a health care provider about: Any allergies you have. All medicines you are taking, including vitamins, herbs, eye drops, creams, and over-the-counter medicines. Any blood disorders you have. Any surgeries you have had. Any medical conditions you have. Whether you are pregnant or may be pregnant. How are the results reported? Your test results will be reported as a value that tells you how much vitamin D is in your blood. Your health care provider will compare your results to normal ranges that were established after testing a large group of people (reference ranges). Reference ranges may vary among labs and hospitals. For vitamin D tests, common reference ranges are: 25-hydroxyvitamin D: 25-80 ng/mL. 1,25-dihydroxyvitamin D: Males: 18-64 pg/mL. Females: 18-78 pg/mL. What do the results mean? If your result is within your  reference range, this means that you have anormal amount of vitamin D in your blood. If your result is lower than your reference range, this means that you have too little vitamin D in your body. If you had the 25-hydroxyvitamin D test specifically: A result of 21-29 ng/mL means that you have slightly lower levels of vitamin D than normal (vitamin D insufficiency). A result of 20 ng/mL or less means that you have very low levels of vitamin D (vitamin D deficiency). Low levels of vitamin D can indicate: Not having enough exposure to sunlight. Not eating enough foods that contain vitamin D. Osteoporosis. Kidney disease. Liver disease. A bone disease that makes the bones weak, soft, or poorly developed (rickets). Softening of the bones (osteomalacia). The body not absorbing vitamin D normally (gastrointestinal malabsorption). If your result is higher than your reference range, this means that you have too much vitamin D in your body. This can result from: Taking too many dietary supplements. Hyperparathyroidism. This is a rare condition in which you do not make enough of a certain type of hormone (parathyroid hormone or PTH). A disease that causes inflammation in your organs and other areas of your body (sarcoidosis). A rare developmental disorder  that is present at birth Jimmye Norman syndrome). Talk with your health care provider about what your results mean. Questions to ask your health care provider Ask your health care provider, or the department that is doing the test: When will my results be ready? How will I get my results? What are my treatment options? What other tests do I need? What are my next steps? Summary You may have a vitamin D test to determine if your body has enough vitamin D. You need vitamin D to maintain bone health and to support your disease-fighting (immune) system. Your vitamin D level is determined with a blood test. Talk with your health care provider about what  your results mean. This information is not intended to replace advice given to you by your health care provider. Make sure you discuss any questions you have with your healthcare provider. Document Revised: 04/30/2020 Document Reviewed: 04/30/2020 Elsevier Patient Education  [place].

## 2021-03-23 LAB — CBC WITH DIFFERENTIAL/PLATELET
Absolute Monocytes: 464 cells/uL (ref 200–950)
Basophils Absolute: 53 cells/uL (ref 0–200)
Basophils Relative: 0.7 %
Eosinophils Absolute: 129 cells/uL (ref 15–500)
Eosinophils Relative: 1.7 %
HCT: 41.5 % (ref 35.0–45.0)
Hemoglobin: 13.4 g/dL (ref 11.7–15.5)
Lymphs Abs: 1968 cells/uL (ref 850–3900)
MCH: 28.5 pg (ref 27.0–33.0)
MCHC: 32.3 g/dL (ref 32.0–36.0)
MCV: 88.1 fL (ref 80.0–100.0)
MPV: 12 fL (ref 7.5–12.5)
Monocytes Relative: 6.1 %
Neutro Abs: 4986 cells/uL (ref 1500–7800)
Neutrophils Relative %: 65.6 %
Platelets: 214 10*3/uL (ref 140–400)
RBC: 4.71 10*6/uL (ref 3.80–5.10)
RDW: 12.6 % (ref 11.0–15.0)
Total Lymphocyte: 25.9 %
WBC: 7.6 10*3/uL (ref 3.8–10.8)

## 2021-03-23 LAB — ANA: Anti Nuclear Antibody (ANA): NEGATIVE

## 2021-03-23 LAB — VITAMIN D 25 HYDROXY (VIT D DEFICIENCY, FRACTURES): Vit D, 25-Hydroxy: 40 ng/mL (ref 30–100)

## 2021-03-23 LAB — SEDIMENTATION RATE: Sed Rate: 2 mm/h (ref 0–20)

## 2021-03-31 ENCOUNTER — Other Ambulatory Visit: Payer: Self-pay

## 2021-03-31 ENCOUNTER — Telehealth: Payer: Self-pay | Admitting: Certified Nurse Midwife

## 2021-03-31 MED ORDER — MEDROXYPROGESTERONE ACETATE 150 MG/ML IM SUSP: 150.0000 mg | INTRAMUSCULAR | 1 refills | Status: DC

## 2021-03-31 NOTE — Telephone Encounter (Signed)
Patient called in and states that she needs a refill on her Depo injection.  Patient was upset and stated that her refill wasn't sent in and she had to reschedule because she wouldn't get her prescription filled on time.  Patient would like a prescription called in to [facility] on [address]  Patient would like a phone call when the prescription has been sent please.

## 2021-03-31 NOTE — Telephone Encounter (Signed)
Refill sent. Pt made aware via MyChart message.

## 2021-04-01 ENCOUNTER — Ambulatory Visit: Payer: Medicaid Other

## 2021-04-02 ENCOUNTER — Ambulatory Visit: Payer: Medicaid Other

## 2021-04-06 ENCOUNTER — Ambulatory Visit (INDEPENDENT_AMBULATORY_CARE_PROVIDER_SITE_OTHER): Payer: Medicaid Other | Admitting: Certified Nurse Midwife

## 2021-04-06 ENCOUNTER — Other Ambulatory Visit: Payer: Self-pay

## 2021-04-06 VITALS — BP 118/76 | HR 57 | Ht 62.0 in | Wt 129.5 lb

## 2021-04-06 DIAGNOSIS — Z3042 Encounter for surveillance of injectable contraceptive: Secondary | ICD-10-CM

## 2021-04-06 MED ORDER — MEDROXYPROGESTERONE ACETATE 150 MG/ML IM SUSP
150.0000 mg | Freq: Once | INTRAMUSCULAR | Status: AC
  Administered 2021-04-06: 150 mg via INTRAMUSCULAR

## 2021-04-06 NOTE — Progress Notes (Signed)
Patient presents for routine depo injection. No questions/concerns voiced by patient at this time, no adverse reactions.

## 2021-04-16 ENCOUNTER — Telehealth: Payer: Self-pay | Admitting: Certified Nurse Midwife

## 2021-04-16 NOTE — Telephone Encounter (Signed)
Pt called asking for refill on her lexapro to get her until her next upcoming appointment which is 10-04 for a physical with [name].

## 2021-04-19 ENCOUNTER — Other Ambulatory Visit: Payer: Self-pay

## 2021-04-19 ENCOUNTER — Telehealth: Payer: Self-pay | Admitting: Certified Nurse Midwife

## 2021-04-19 MED ORDER — ESCITALOPRAM OXALATE 20 MG PO TABS: 20.0000 mg | ORAL_TABLET | Freq: Every day | ORAL | 1 refills | Status: DC

## 2021-04-19 NOTE — Telephone Encounter (Signed)
Patient called in for a Lexapro refill and has been without medication for over a week.  Patient uses [facility] on [address]

## 2021-04-19 NOTE — Telephone Encounter (Signed)
Refill sent, patient made aware via mychart message.

## 2021-04-20 ENCOUNTER — Ambulatory Visit: Payer: Medicaid Other | Admitting: Internal Medicine

## 2021-04-23 ENCOUNTER — Encounter: Payer: Medicaid Other | Admitting: Certified Nurse Midwife

## 2021-04-29 ENCOUNTER — Encounter: Payer: Medicaid Other | Admitting: Certified Nurse Midwife

## 2021-05-04 ENCOUNTER — Encounter: Payer: Medicaid Other | Admitting: Certified Nurse Midwife

## 2021-05-13 ENCOUNTER — Other Ambulatory Visit: Payer: Self-pay

## 2021-05-13 ENCOUNTER — Encounter: Payer: Self-pay | Admitting: Internal Medicine

## 2021-05-13 ENCOUNTER — Ambulatory Visit: Payer: Medicaid Other | Admitting: Internal Medicine

## 2021-05-13 VITALS — BP 117/68 | HR 83 | Resp 14 | Ht 61.5 in | Wt 132.0 lb

## 2021-05-13 DIAGNOSIS — S29011A Strain of muscle and tendon of front wall of thorax, initial encounter: Secondary | ICD-10-CM | POA: Insufficient documentation

## 2021-05-13 DIAGNOSIS — Z840 Family history of diseases of the skin and subcutaneous tissue: Secondary | ICD-10-CM | POA: Diagnosis not present

## 2021-05-13 MED ORDER — CYCLOBENZAPRINE HCL 10 MG PO TABS: 5.0000 mg | ORAL_TABLET | Freq: Every evening | ORAL | 0 refills | Status: DC | PRN

## 2021-05-13 NOTE — Patient Instructions (Signed)
I recommend taking naproxen 1-2 times daily for antiinflammatory medication for pain and inflammation relief, at least once daily for next 2 weeks. Can try Flexeril/cyclobenzaprine at night as needed for pain as well, watch out for drowsiness associated with this medicine can half the dose if causes a problem. Cold treatment topically may be helpful for relief during symptoms acting up. If pain does not improve within the next 3 or 4 weeks with these treatments please let us know we can order an xray to make sure there is no persistent problem.

## 2021-05-13 NOTE — Progress Notes (Signed)
Office Visit Note  Patient: Tamara Bishop             Date of Birth: November 24, 1995           MRN: [id number]             PCP: [name], CNM Referring: [name], CNM Visit Date: 05/13/2021   Subjective:  Follow-up (Right sided rib pain since testing positive for COVID on 04/15/2021, worsening)   History of Present Illness: Tamara Bishop is a 25 y.o. female here for follow up with several generalized symptoms concern for underlying inflammatory condition with skin rashes, joint pain, oral ulcers, lymphadenopathy, and concentration difficulty. Labs checked at that time showed negative ANA, normal sedimentation rate, normal CBC, and normal vitamin D. She was concerned due to a strong family history of autoimmune disease and lupus.  She and her daughter became ill with COVID in mid September confirmed with positive test symptoms of fevers body aches and cough although these initially improved after about 1 week.  He has had persistent right-sided chest pain worsened by talking coughing lying on her back more little pressure to that area.  Partial benefit with taking Tylenol as needed.  Since having COVID she has had a productive cough with some yellow-green or otherwise darkening mucus intermittently but no sinus congestion.  She denies coughing up any blood she denies feeling any particular shortness of breath or difficulty breathing. With the change in her insurance status and the Medicaid through her obstetric history and is working on establishing with a new primary care provider.  Previous HPI 03/22/21 Tamara Bishop is a 25 y.o. female here for evaluation of numerous symptoms with concern for lupus. She has a family history of lupus with her mother who died at age 2 and her sister aged 71 living in [place].  She described longstanding symptoms starting since around age 89 but mostly noticeable and with significant more severity since her 2 pregnancies for children aged 1  and 2.  She reports chronic fatigue and poor sleep waking up approximately every 1 hour during the night with difficulty returning to sleep due to chest wall pain lying in flat positions and frequent urination.  She has worsening difficulty with concentration and memory feeling that her mental abilities are impaired compared to usual including routine tasks and remembering lists.  She has joint pain in multiple sites particularly in the bilateral hands and forearms as well as the chest wall pain.  She does not typically notice large amounts of swelling but does sometimes see pallor or purple discoloration in her hands and toes associated with numbness and tingling.  The chest wall pain usually worsens on deep inspiration also cannot lie flat on her back without worsening.  She describes skin rashes breaking out over both cheeks and on portions of her distal forearms with sun exposure lasting up to 1 week after onset.  She also has some chronic erythematous patches over the elbows apparently eczema.  She has chronic allergic rhinitis symptoms also breaking out in itchy urticarial rashes without known specific triggers these resolved without residual hyperpigmentation. She notices intermittently painful ulcers on the sides of the tongue and on buccal mucosa bilaterally. Chronic intermittent lymphadenopathy involving both sides of the neck without much tenderness.  She also feels there is some spontaneous bruising affecting her thighs and toes on the right foot.  Her pregnancies were 2 full-term did have preeclampsia complications but without history of miscarriages.  Review of Systems  Constitutional:  Positive for fatigue.  HENT:  Positive for mouth dryness.   Eyes:  Positive for dryness.  Respiratory:  Positive for shortness of breath.   Cardiovascular:  Negative for swelling in legs/feet.  Gastrointestinal:  Positive for constipation and diarrhea.  Endocrine: Positive for excessive thirst and increased  urination.  Genitourinary:  Negative for difficulty urinating.  Musculoskeletal:  Positive for joint pain, gait problem, joint pain, muscle weakness, morning stiffness and muscle tenderness.  Skin:  Negative for rash.  Allergic/Immunologic: Positive for susceptible to infections.  Neurological:  Positive for numbness and weakness.  Hematological:  Positive for bruising/bleeding tendency.  Psychiatric/Behavioral:  Positive for sleep disturbance.    PMFS History:  Patient Active Problem List   Diagnosis Date Noted   Intercostal muscle strain 05/13/2021   Rash and other nonspecific skin eruption 03/22/2021   Lymphadenopathy 03/22/2021   Arthralgia 03/22/2021   Bacterial vaginosis    Shortness of breath    History of severe pre-eclampsia    Vitamin D deficiency 10/16/2018   B12 deficiency 10/16/2018   Pregnancy 10/14/2018   Low grade 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) 05/01/2018   History of marijuana use 04/10/2018   Acute pharyngitis 07/13/2015   Dyspepsia 06/02/2015   Family history of lupus erythematosus 06/02/2015   Adjustment disorder with anxiety    Allergic rhinitis    Insomnia    Anxiety and depression 02/16/2015    Past Medical History:  Diagnosis Date   Abnormal uterine bleeding    Adjustment disorder with anxiety    Allergic rhinitis    Anemia    Anxiety    Cigarette smoker    Depression    Hypertension    Insomnia    Tobacco abuse     Family History  Problem Relation Age of Onset   Lupus Mother    Diabetes Father    Breast cancer Maternal Grandmother    Cancer Maternal Grandmother         breast   Diabetes Paternal Grandmother    Diabetes Paternal Grandfather    Lupus Sister    Heart disease Maternal Grandfather    Ovarian cancer Neg Hx    Colon cancer Neg Hx    Past Surgical History:  Procedure Laterality Date   NO PAST SURGERIES     WISDOM TOOTH EXTRACTION     Social History   Social History Narrative   Not on  file   Immunization History  Administered Date(s) Administered   Influenza,inj,Quad PF,6+ Mos 06/02/2015, 05/21/2018, 04/16/2019   Tdap 05/09/2014, 08/17/2018     Objective: Vital Signs: BP 117/68 (BP Location: Left Arm, Patient Position: Sitting, Cuff Size: Normal)   Pulse 83   Resp 14   Ht 5' 1.5" (1.562 m)   Wt 132 lb (59.9 kg)   BMI 24.54 kg/m    Physical Exam Eyes:     Conjunctiva/sclera: Conjunctivae normal.  Cardiovascular:     Rate and Rhythm: Normal rate and regular rhythm.  Pulmonary:     Effort: Pulmonary effort is normal.     Breath sounds: Normal breath sounds.  Musculoskeletal:     Right lower leg: No edema.     Left lower leg: No edema.  Skin:    General: Skin is warm and dry.     Findings: No rash.  Neurological:     Mental Status: She is alert.  Psychiatric:        Mood and Affect: Mood normal.  Tenderness to palpation around the right side from posterior around laterally in the T5-T7 level, no palpable abnormality no overlying rash or erythema   Investigation: No additional findings.  Imaging: No results found.  Recent Labs: Lab Results  Component Value Date   WBC 7.6 03/22/2021   HGB 13.4 03/22/2021   PLT 214 03/22/2021   NA 140 01/15/2020   K 3.9 01/15/2020   CL 106 01/15/2020   CO2 20 01/15/2020   GLUCOSE 74 01/15/2020   BUN 6 01/15/2020   CREATININE 0.65 01/15/2020   BILITOT 0.2 01/15/2020   ALKPHOS 275 (H) 01/15/2020   AST 18 01/15/2020   ALT 7 01/15/2020   PROT 5.7 (L) 01/15/2020   ALBUMIN 3.3 (L) 01/15/2020   CALCIUM 9.2 01/15/2020   GFRAA 145 01/15/2020    Speciality Comments: No specialty comments available.  Procedures:  No procedures performed Allergies: Azithromycin   Assessment / Plan:     Visit Diagnoses: Intercostal muscle strain, initial encounter - Plan: cyclobenzaprine (FLEXERIL) 10 MG tablet  Right-sided chest wall pain seems most consistent with intercostal muscle strain.  No pain at the sternum or  anteriorly seems less typical for costochondritis.  Recent COVID infection but lung sounds are normal no hemoptysis no dyspnea no tachycardia I think pleurisy pneumonia or pulmonary embolism are less likely.  Recommend symptomatic management for now discussed oral naproxen use 1-2 times daily at least once for the next 2 weeks.  Discussed topical treatments such as over the affected area as needed.  Prescribed Flexeril 10 mg at night as needed for symptom relief to improve sleep, she previously has taken this medicine during pregnancy with good tolerance. Recommended if symptoms do not improve within the next 3 to 4 weeks that would be expected for a muscle strain injury she should let us know would obtain chest x-ray for further evaluation.  Family history of lupus erythematosus  Laboratory work-up was all negative which is reassuring. Despite her multiple symptoms but none are specific or reproducible.  Orders: No orders of the defined types were placed in this encounter.  Meds ordered this encounter  Medications   cyclobenzaprine (FLEXERIL) 10 MG tablet    Sig: Take 0.5 tablets (5 mg total) by mouth at bedtime as needed for muscle spasms.    Dispense:  20 tablet    Refill:  0     Follow-Up Instructions: Return if symptoms worsen or fail to improve.   Fuller Plan, MD  Note - This record has been created using [employer].  Chart creation errors have been sought, but may not always  have been located. Such creation errors do not reflect on  the standard of medical care.

## 2021-06-21 ENCOUNTER — Ambulatory Visit: Payer: Medicaid Other

## 2021-06-21 ENCOUNTER — Other Ambulatory Visit: Payer: Self-pay

## 2021-06-21 ENCOUNTER — Telehealth: Payer: Self-pay | Admitting: Certified Nurse Midwife

## 2021-06-21 MED ORDER — MEDROXYPROGESTERONE ACETATE 150 MG/ML IM SUSP: 150.0000 mg | INTRAMUSCULAR | 1 refills | Status: DC

## 2021-06-21 NOTE — Telephone Encounter (Signed)
Refill sent.

## 2021-06-21 NOTE — Telephone Encounter (Signed)
Pt is calling in needing to have her Depo refilled.  Pharm:  [facility] on [address] in [place], [place]

## 2021-06-22 ENCOUNTER — Ambulatory Visit: Payer: Medicaid Other

## 2021-06-23 NOTE — Progress Notes (Signed)
Date last pap: 04/21/2020. Last Depo-Provera: 04/06/2020. Side Effects if any: None. Serum HCG indicated? N/A. Depo-Provera 150 mg IM given by: Santiago Bumpers, CMA. Next appointment due Feb. 13 - Feb. 27

## 2021-06-28 ENCOUNTER — Other Ambulatory Visit: Payer: Self-pay

## 2021-06-28 ENCOUNTER — Ambulatory Visit (INDEPENDENT_AMBULATORY_CARE_PROVIDER_SITE_OTHER): Payer: Medicaid Other | Admitting: Certified Nurse Midwife

## 2021-06-28 DIAGNOSIS — Z3042 Encounter for surveillance of injectable contraceptive: Secondary | ICD-10-CM

## 2021-06-28 MED ORDER — MEDROXYPROGESTERONE ACETATE 150 MG/ML IM SUSP
150.0000 mg | INTRAMUSCULAR | Status: AC
  Administered 2021-06-28 – 2021-10-08 (×2): 150 mg via INTRAMUSCULAR

## 2021-07-14 ENCOUNTER — Encounter: Payer: Self-pay | Admitting: Certified Nurse Midwife

## 2021-07-15 MED ORDER — PAROXETINE HCL 10 MG PO TABS: 10.0000 mg | ORAL_TABLET | Freq: Every day | ORAL | 1 refills | Status: DC

## 2021-07-21 ENCOUNTER — Encounter: Payer: Medicaid Other | Admitting: Certified Nurse Midwife

## 2021-09-21 NOTE — Progress Notes (Deleted)
Date last pap: 04/21/2020. Last Depo-Provera: 06/28/2021 Side Effects if any: ***  Serum HCG indicated? *** Depo-Provera 150 mg IM given by: Next appointment due 5/12-5/26, 2023

## 2021-09-24 ENCOUNTER — Ambulatory Visit: Payer: Medicaid Other

## 2021-09-24 DIAGNOSIS — Z3042 Encounter for surveillance of injectable contraceptive: Secondary | ICD-10-CM

## 2021-10-05 ENCOUNTER — Encounter: Payer: Self-pay | Admitting: Certified Nurse Midwife

## 2021-10-06 ENCOUNTER — Other Ambulatory Visit: Payer: Self-pay

## 2021-10-06 ENCOUNTER — Telehealth: Payer: Self-pay | Admitting: Certified Nurse Midwife

## 2021-10-06 MED ORDER — MEDROXYPROGESTERONE ACETATE 150 MG/ML IM SUSP: 150.0000 mg | INTRAMUSCULAR | 1 refills | Status: DC

## 2021-10-06 NOTE — Telephone Encounter (Signed)
Pt called to reschedule her Depo injection- she missed her last apt- depo expired 2-27- pt rescheduled for 3-10, pt requesting rx for depo to be sent to [facility] pharmacy on [address] in [place]. Please Advise.  ?

## 2021-10-06 NOTE — Telephone Encounter (Signed)
Refill sent.

## 2021-10-08 ENCOUNTER — Other Ambulatory Visit: Payer: Self-pay

## 2021-10-08 ENCOUNTER — Ambulatory Visit (INDEPENDENT_AMBULATORY_CARE_PROVIDER_SITE_OTHER): Payer: Medicaid Other | Admitting: Certified Nurse Midwife

## 2021-10-08 DIAGNOSIS — Z3042 Encounter for surveillance of injectable contraceptive: Secondary | ICD-10-CM | POA: Diagnosis not present

## 2021-10-08 NOTE — Progress Notes (Signed)
Date last pap: 04/21/2020. ?Last Depo-Provera: 04/06/2020. ?Side Effects if any: None. ?Serum HCG indicated? Negative. Patient was late coming in. (Feb. 13 - Feb. 27) ?Depo-Provera 150 mg IM given by: Santiago Bumpers, CMA. ?Next appointment due: May 26 - June 9 ?

## 2021-10-11 LAB — POCT URINALYSIS DIPSTICK OB
Bilirubin, UA: NEGATIVE
Blood, UA: NEGATIVE
Glucose, UA: NEGATIVE
Ketones, UA: NEGATIVE
Leukocytes, UA: NEGATIVE
Nitrite, UA: NEGATIVE
POC,PROTEIN,UA: NEGATIVE
Spec Grav, UA: 1.01 (ref 1.010–1.025)
Urobilinogen, UA: 0.2 E.U./dL
pH, UA: 6.5 (ref 5.0–8.0)

## 2021-10-11 NOTE — Progress Notes (Signed)
Urine results have been entered. ? ?

## 2021-10-20 ENCOUNTER — Encounter: Payer: Medicaid Other | Admitting: Certified Nurse Midwife

## 2021-12-22 ENCOUNTER — Encounter: Payer: Medicaid Other | Admitting: Certified Nurse Midwife

## 2021-12-24 ENCOUNTER — Ambulatory Visit: Payer: Medicaid Other

## 2022-01-03 ENCOUNTER — Ambulatory Visit: Payer: Medicaid Other

## 2022-02-16 ENCOUNTER — Encounter: Payer: Self-pay | Admitting: Certified Nurse Midwife

## 2022-02-21 ENCOUNTER — Other Ambulatory Visit: Payer: Self-pay | Admitting: Certified Nurse Midwife

## 2022-02-21 MED ORDER — ESCITALOPRAM OXALATE 20 MG PO TABS: 20.0000 mg | ORAL_TABLET | Freq: Every day | ORAL | 1 refills | Status: DC

## 2022-02-22 ENCOUNTER — Other Ambulatory Visit: Payer: Self-pay | Admitting: Certified Nurse Midwife

## 2022-03-07 ENCOUNTER — Ambulatory Visit (INDEPENDENT_AMBULATORY_CARE_PROVIDER_SITE_OTHER): Payer: Medicaid Other

## 2022-03-07 ENCOUNTER — Ambulatory Visit
Admission: EM | Admit: 2022-03-07 | Discharge: 2022-03-07 | Disposition: A | Discharge status: Home / Self Care | Payer: Medicaid Other | Attending: Nurse Practitioner | Admitting: Nurse Practitioner

## 2022-03-07 DIAGNOSIS — R0789 Other chest pain: Secondary | ICD-10-CM | POA: Diagnosis not present

## 2022-03-07 DIAGNOSIS — R079 Chest pain, unspecified: Secondary | ICD-10-CM | POA: Diagnosis not present

## 2022-03-07 MED ORDER — CYCLOBENZAPRINE HCL 5 MG PO TABS: 5.0000 mg | ORAL_TABLET | Freq: Every day | ORAL | 0 refills | Status: AC

## 2022-03-07 NOTE — Discharge Instructions (Addendum)
Chest xray Developing left lower lobe hazy airspace opacity. Differential diagnosis includes prominent vasculature. Followup PA and lateral chest X-ray is recommended in 3-4 weeks following therapy to ensure resolution. We will provide patient with cyclobenzaprine 5 mg 3 times daily..  Encourage patient to make sure that she follows up within 3 to 4 weeks for repeat chest x-ray with her primary care provider.  She verbalized understanding of current plan and agreed.

## 2022-03-07 NOTE — ED Triage Notes (Signed)
Patient to Urgent Care with complaints of chest pain x3 weeks.   Describes pain as heavy/ pressure like, area of pain is under right breast and radiates into right shoulder. Reports that it hurts to breathe/ sneeze/ cough. Has been using ibuprofen/ tylenol/ and heating pads with limited relief in symptoms. Denies injury.   Diagnosed with costochondritis several years ago but intermittently feels the same pain 1-2 times yearly.

## 2022-03-07 NOTE — ED Provider Notes (Signed)
[facility]    CSN: [id number] Arrival date & time: 03/07/22  1702      History   Chief Complaint Chief Complaint  Patient presents with   Chest Pain    HPI Tamara Bishop is a 26 y.o. female.   HPI  She is complaining of right chest pain. She has a history of costochondritis and has used Tylenol, heat and patches without relief.  She feels like taking deep breaths makes the pain worse along with specific movements.  She is concerned because she has 2 small children and has to be active.  She denies any recent accidents or injuries, fever, chills, headaches, dizziness, shortness of breath.  She denies nausea or vomiting  Past Medical History:  Diagnosis Date   Abnormal uterine bleeding    Adjustment disorder with anxiety    Allergic rhinitis    Anemia    Anxiety    Cigarette smoker    Depression    Hypertension    Insomnia    Tobacco abuse     Patient Active Problem List   Diagnosis Date Noted   Intercostal muscle strain 05/13/2021   Rash and other nonspecific skin eruption 03/22/2021   Lymphadenopathy 03/22/2021   Arthralgia 03/22/2021   Bacterial vaginosis    Shortness of breath    History of severe pre-eclampsia    Vitamin D deficiency 10/16/2018   B12 deficiency 10/16/2018   Pregnancy 10/14/2018   Low grade 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) 05/01/2018   History of marijuana use 04/10/2018   Acute pharyngitis 07/13/2015   Dyspepsia 06/02/2015   Family history of lupus erythematosus 06/02/2015   Adjustment disorder with anxiety    Allergic rhinitis    Insomnia    Anxiety and depression 02/16/2015    Past Surgical History:  Procedure Laterality Date   NO PAST SURGERIES     WISDOM TOOTH EXTRACTION      OB History     Gravida  2   Para  2   Term  2   Preterm  0   AB  0   Living  2      SAB  0   IAB  0   Ectopic  0   Multiple  0   Live Births  2            Home Medications     Prior to Admission medications   Medication Sig Start Date End Date Taking? Authorizing Provider  cyclobenzaprine (FLEXERIL) 5 MG tablet Take 1 tablet (5 mg total) by mouth at bedtime for 10 days. 03/07/22 03/17/22 Yes Barbette Merino, NP  escitalopram (LEXAPRO) 20 MG tablet TAKE 1 TABLET BY MOUTH ONCE DAILY 02/22/22   [name], CNM  medroxyPROGESTERone (DEPO-PROVERA) 150 MG/ML injection Inject 1 mL (150 mg total) into the muscle every 3 (three) months. 10/06/21   [name], CNM    Family History Family History  Problem Relation Age of Onset   Lupus Mother    Diabetes Father    Breast cancer Maternal Grandmother    Cancer Maternal Grandmother         breast   Diabetes Paternal Grandmother    Diabetes Paternal Grandfather    Lupus Sister    Heart disease Maternal Grandfather    Ovarian cancer Neg Hx    Colon cancer Neg Hx     Social History Social History   Tobacco Use   Smoking status: Former    Packs/day:  0.50    Years: 1.00    Total pack years: 0.50    Types: Cigarettes    Quit date: 2019    Years since quitting: 4.6   Smokeless tobacco: Never  Vaping Use   Vaping Use: Never used  Substance Use Topics   Alcohol use: No   Drug use: No     Allergies   Azithromycin   Review of Systems Review of Systems   Physical Exam Triage Vital Signs ED Triage Vitals [03/07/22 1753]  Enc Vitals Group     BP 116/87     Pulse Rate 76     Resp 17     Temp 98.5 F (36.9 C)     Temp Source Oral     SpO2 100 %     Weight 140 lb (63.5 kg)     Height 5' 1.5" (1.562 m)     Head Circumference      Peak Flow      Pain Score 6     Pain Loc      Pain Edu?      Excl. in GC?    No data found.  Updated Vital Signs BP 116/87   Pulse 76   Temp 98.5 F (36.9 C) (Oral)   Resp 17   Ht 5' 1.5" (1.562 m)   Wt 140 lb (63.5 kg)   SpO2 100%   BMI 26.02 kg/m   Visual Acuity Right Eye Distance:   Left Eye Distance:   Bilateral Distance:    Right Eye Near:    Left Eye Near:    Bilateral Near:     Physical Exam HENT:     Head: Normocephalic.  Eyes:     Pupils: Pupils are equal, round, and reactive to light.  Cardiovascular:     Rate and Rhythm: Normal rate and regular rhythm.     Heart sounds: Normal heart sounds.  Pulmonary:     Effort: Pulmonary effort is normal.     Breath sounds: Normal breath sounds. No wheezing, rhonchi or rales.  Chest:     Chest wall: No mass.  Musculoskeletal:        General: Normal range of motion.  Skin:    General: Skin is warm and dry.     Capillary Refill: Capillary refill takes less than 2 seconds.  Neurological:     General: No focal deficit present.     Mental Status: She is alert and oriented to person, place, and time.  Psychiatric:        Mood and Affect: Mood normal.        Behavior: Behavior normal.      UC Treatments / Results  Labs (all labs ordered are listed, but only abnormal results are displayed) Labs Reviewed - No data to display  EKG   Radiology DG Chest 2 View  Result Date: 03/07/2022 CLINICAL DATA:  left chest pain EXAM: CHEST - 2 VIEW COMPARISON:  Cxr 10/21/18 FINDINGS: The heart and mediastinal contours are within normal limits. Developing left lower lobe hazy airspace opacity. No pulmonary edema. No pleural effusion. No pneumothorax. No acute osseous abnormality. IMPRESSION: Developing left lower lobe hazy airspace opacity. Differential diagnosis includes prominent vasculature. Followup PA and lateral chest X-ray is recommended in 3-4 weeks following therapy to ensure resolution. Electronically Signed   By: Tish Frederickson M.D.   On: 03/07/2022 19:34    Procedures Procedures (including critical care time)  Medications Ordered in UC Medications - No data to  display  Initial Impression / Assessment and Plan / UC Course  I have reviewed the triage vital signs and the nursing notes.  Pertinent labs & imaging results that were available during my care of the patient were  reviewed by me and considered in my medical decision making (see chart for details).     Chest wall pain Final Clinical Impressions(s) / UC Diagnoses   Final diagnoses:  Chest wall pain     Discharge Instructions      Chest xray Developing left lower lobe hazy airspace opacity. Differential diagnosis includes prominent vasculature. Followup PA and lateral chest X-ray is recommended in 3-4 weeks following therapy to ensure resolution. We will provide patient with cyclobenzaprine 5 mg 3 times daily..  Encourage patient to make sure that she follows up within 3 to 4 weeks for repeat chest x-ray with her primary care provider.  She verbalized understanding of current plan and agreed.      ED Prescriptions     Medication Sig Dispense Auth. Provider   cyclobenzaprine (FLEXERIL) 5 MG tablet Take 1 tablet (5 mg total) by mouth at bedtime for 10 days. 10 tablet Barbette Merino, NP      PDMP not reviewed this encounter.   Thad Ranger [place], [place] 03/07/22 [phone]

## 2022-04-08 ENCOUNTER — Ambulatory Visit: Payer: Medicaid Other

## 2022-04-12 ENCOUNTER — Encounter: Payer: Medicaid Other | Admitting: Certified Nurse Midwife

## 2022-04-17 ENCOUNTER — Emergency Department
Admission: EM | Admit: 2022-04-17 | Discharge: 2022-04-17 | Disposition: A | Discharge status: Home / Self Care | Payer: Medicaid Other | Attending: Emergency Medicine | Admitting: Emergency Medicine

## 2022-04-17 ENCOUNTER — Other Ambulatory Visit: Payer: Self-pay

## 2022-04-17 DIAGNOSIS — Z20822 Contact with and (suspected) exposure to covid-19: Secondary | ICD-10-CM | POA: Insufficient documentation

## 2022-04-17 DIAGNOSIS — I10 Essential (primary) hypertension: Secondary | ICD-10-CM | POA: Insufficient documentation

## 2022-04-17 DIAGNOSIS — J029 Acute pharyngitis, unspecified: Secondary | ICD-10-CM | POA: Diagnosis present

## 2022-04-17 DIAGNOSIS — F17210 Nicotine dependence, cigarettes, uncomplicated: Secondary | ICD-10-CM | POA: Diagnosis not present

## 2022-04-17 DIAGNOSIS — J028 Acute pharyngitis due to other specified organisms: Secondary | ICD-10-CM | POA: Diagnosis not present

## 2022-04-17 DIAGNOSIS — B9789 Other viral agents as the cause of diseases classified elsewhere: Secondary | ICD-10-CM | POA: Diagnosis not present

## 2022-04-17 LAB — GROUP A STREP BY PCR: Group A Strep by PCR: NOT DETECTED

## 2022-04-17 LAB — SARS CORONAVIRUS 2 BY RT PCR: SARS Coronavirus 2 by RT PCR: NEGATIVE

## 2022-04-17 MED ORDER — ONDANSETRON 4 MG PO TBDP: 4.0000 mg | ORAL_TABLET | Freq: Four times a day (QID) | ORAL | 0 refills | Status: DC | PRN

## 2022-04-17 MED ORDER — DEXAMETHASONE SODIUM PHOSPHATE 10 MG/ML IJ SOLN
10.0000 mg | Freq: Once | INTRAMUSCULAR | Status: AC
  Administered 2022-04-17: 10 mg via INTRAMUSCULAR
  Filled 2022-04-17: qty 1

## 2022-04-17 MED ORDER — IBUPROFEN 400 MG PO TABS
400.0000 mg | ORAL_TABLET | Freq: Once | ORAL | Status: AC
  Administered 2022-04-17: 400 mg via ORAL
  Filled 2022-04-17: qty 1

## 2022-04-17 MED ORDER — ONDANSETRON 4 MG PO TBDP
4.0000 mg | ORAL_TABLET | Freq: Once | ORAL | Status: AC
  Administered 2022-04-17: 4 mg via ORAL
  Filled 2022-04-17: qty 1

## 2022-04-17 NOTE — ED Triage Notes (Signed)
Patient reports sore throat and swollen lymph nodes.  Reports child at home COVID +.

## 2022-04-17 NOTE — Discharge Instructions (Addendum)
You may alternate Tylenol 1000 mg every 6 hours as needed for pain, fever and Ibuprofen 800 mg every 6-8 hours as needed for pain, fever.  Please take Ibuprofen with food.  Do not take more than 4000 mg of Tylenol (acetaminophen) in a 24 hour period. ° °

## 2022-04-17 NOTE — ED Provider Notes (Addendum)
[facility] Provider Note    Event Date/Time   First MD Initiated Contact with Patient 04/17/22 [phone]     (approximate)   History   Sore Throat   HPI  Tamara Bishop is a 26 y.o. female with history of hypertension, depression, tobacco use who presents to the emergency department with congestion, cough, bilateral ear pain and sore throat that started yesterday.  Reports fevers, chills and 1 episode of vomiting.  No diarrhea.  Recently exposed to child who tested positive for COVID.  Took 400 mg of ibuprofen prior to arrival.  No chest pain, shortness of breath, abdominal pain, diarrhea, urinary symptoms.  Patient states that she just got back from a cruise at the beginning of September from the [place] where multiple people had COVID-19.  She had congestion since that cruise but then sore throat, fevers and chills worsened tonight.   History provided by patient.    Past Medical History:  Diagnosis Date   Abnormal uterine bleeding    Adjustment disorder with anxiety    Allergic rhinitis    Anemia    Anxiety    Cigarette smoker    Depression    Hypertension    Insomnia    Tobacco abuse     Past Surgical History:  Procedure Laterality Date   NO PAST SURGERIES     WISDOM TOOTH EXTRACTION      MEDICATIONS:  Prior to Admission medications   Medication Sig Start Date End Date Taking? Authorizing Provider  escitalopram (LEXAPRO) 20 MG tablet TAKE 1 TABLET BY MOUTH ONCE DAILY 02/22/22   [name], CNM  medroxyPROGESTERone (DEPO-PROVERA) 150 MG/ML injection Inject 1 mL (150 mg total) into the muscle every 3 (three) months. 10/06/21   [name], CNM    Physical Exam   Triage Vital Signs: ED Triage Vitals  Enc Vitals Group     BP 04/17/22 0338 117/85     Pulse Rate 04/17/22 0338 90     Resp 04/17/22 0338 18     Temp 04/17/22 0338 99.3 F (37.4 C)     Temp Source 04/17/22 0338 Oral     SpO2 04/17/22 0338 99 %     Weight 04/17/22  0333 140 lb (63.5 kg)     Height 04/17/22 0333 5\' 4"  (1.626 m)     Head Circumference --      Peak Flow --      Pain Score 04/17/22 0333 6     Pain Loc --      Pain Edu? --      Excl. in [place]? --     Most recent vital signs: Vitals:   04/17/22 0400 04/17/22 0500  BP: 117/88 112/76  Pulse: 80 75  Resp: 18 18  Temp:    SpO2: 100% 99%    CONSTITUTIONAL: Alert and oriented and responds appropriately to questions. Well-appearing; well-nourished HEAD: Normocephalic, atraumatic EYES: Conjunctivae clear, pupils appear equal, sclera nonicteric ENT: normal nose; moist mucous membranes; patient has posterior pharyngeal erythema and bilateral tonsillar hypertrophy without significant exudate, no uvular deviation, no unilateral swelling in posterior oropharynx, no trismus or drooling, no muffled voice, normal phonation, no stridor, airway patent.  TMs are clear bilaterally without erythema, purulence, bulging, perforation, effusion.  No cerumen impaction or sign of foreign body in the external auditory canal. No inflammation, erythema or drainage from the external auditory canal. No signs of mastoiditis. No pain with manipulation of the pinna bilaterally. NECK: Supple, normal ROM, no  cervical lymphadenopathy, no meningismus CARD: RRR; S1 and S2 appreciated; no murmurs, no clicks, no rubs, no gallops RESP: Normal chest excursion without splinting or tachypnea; breath sounds clear and equal bilaterally; no wheezes, no rhonchi, no rales, no hypoxia or respiratory distress, speaking full sentences ABD/GI: Normal bowel sounds; non-distended; soft, non-tender, no rebound, no guarding, no peritoneal signs BACK: The back appears normal EXT: Normal ROM in all joints; no deformity noted, no edema; no cyanosis SKIN: Normal color for age and race; warm; no rash on exposed skin NEURO: Moves all extremities equally, normal speech PSYCH: The patient's mood and manner are appropriate.   ED Results / Procedures /  Treatments   LABS: (all labs ordered are listed, but only abnormal results are displayed) Labs Reviewed  SARS CORONAVIRUS 2 BY RT PCR  GROUP A STREP BY PCR     EKG:   RADIOLOGY: My personal review and interpretation of imaging:    I have personally reviewed all radiology reports.   No results found.   PROCEDURES:  Critical Care performed: No     Procedures    IMPRESSION / MDM / ASSESSMENT AND PLAN / ED COURSE  I reviewed the triage vital signs and the nursing notes.    Patient here with congestion and sore throat.     DIFFERENTIAL DIAGNOSIS (includes but not limited to):   COVID-19, strep pharyngitis, tonsillitis, doubt mononucleosis, sepsis, bacteremia.  No signs of deep space neck infection, PTA, meningitis.   Patient's presentation is most consistent with acute presentation with potential threat to life or bodily function.   PLAN: We will obtain strep swab, COVID swab.  Will give ibuprofen, Decadron, Zofran for symptomatic relief.   MEDICATIONS GIVEN IN ED: Medications  ibuprofen (ADVIL) tablet 400 mg (400 mg Oral Given 04/17/22 0411)  dexamethasone (DECADRON) injection 10 mg (10 mg Intramuscular Given 04/17/22 0411)  ondansetron (ZOFRAN-ODT) disintegrating tablet 4 mg (4 mg Oral Given 04/17/22 0411)     ED COURSE: COVID and strep negative.  Suspect viral illness causing symptoms.  I do not feel she needs antibiotics.  We discussed that this could be COVID given recent positive exposure at home and recommended repeat testing at home.  Discussed supportive care instructions and return precautions.  She is comfortable with this plan.   At this time, I do not feel there is any life-threatening condition present. I reviewed all nursing notes, vitals, pertinent previous records.  All lab and urine results, EKGs, imaging ordered have been independently reviewed and interpreted by myself.  I reviewed all available radiology reports from any imaging ordered this  visit.  Based on my assessment, I feel the patient is safe to be discharged home without further emergent workup and can continue workup as an outpatient as needed. Discussed all findings, treatment plan as well as usual and customary return precautions.  They verbalize understanding and are comfortable with this plan.  Outpatient follow-up has been provided as needed.  All questions have been answered.    CONSULTS:  none   OUTSIDE RECORDS REVIEWED:  none       FINAL CLINICAL IMPRESSION(S) / ED DIAGNOSES   Final diagnoses:  Viral pharyngitis     Rx / DC Orders   ED Discharge Orders          Ordered    ondansetron (ZOFRAN-ODT) 4 MG disintegrating tablet  Every 6 hours PRN        04/17/22 0451  Note:  This document was prepared using Dragon voice recognition software and may include unintentional dictation errors.   Maylie Ashton, Delice Bison, DO 04/17/22 [place], Delice Bison, DO 04/17/22 [phone]

## 2022-04-17 NOTE — ED Notes (Signed)
Pt verbalizes understanding discharge instructions.  Pt ambulated to private vehicle.  

## 2022-04-18 ENCOUNTER — Ambulatory Visit (INDEPENDENT_AMBULATORY_CARE_PROVIDER_SITE_OTHER): Payer: Medicaid Other | Admitting: Certified Nurse Midwife

## 2022-04-18 ENCOUNTER — Other Ambulatory Visit (HOSPITAL_COMMUNITY)
Admission: RE | Admit: 2022-04-18 | Discharge: 2022-04-18 | Disposition: A | Discharge status: Home / Self Care | Payer: Medicaid Other | Source: Ambulatory Visit | Attending: Certified Nurse Midwife | Admitting: Certified Nurse Midwife

## 2022-04-18 ENCOUNTER — Encounter: Payer: Self-pay | Admitting: Certified Nurse Midwife

## 2022-04-18 VITALS — BP 108/74 | HR 62 | Ht 62.0 in | Wt 144.9 lb

## 2022-04-18 DIAGNOSIS — Z01419 Encounter for gynecological examination (general) (routine) without abnormal findings: Secondary | ICD-10-CM

## 2022-04-18 DIAGNOSIS — Z13220 Encounter for screening for lipoid disorders: Secondary | ICD-10-CM

## 2022-04-18 DIAGNOSIS — Z113 Encounter for screening for infections with a predominantly sexual mode of transmission: Secondary | ICD-10-CM | POA: Diagnosis not present

## 2022-04-18 MED ORDER — ESCITALOPRAM OXALATE 20 MG PO TABS: 20.0000 mg | ORAL_TABLET | Freq: Every day | ORAL | 3 refills | Status: DC

## 2022-04-18 MED ORDER — ZOLPIDEM TARTRATE 5 MG PO TABS
5.0000 mg | ORAL_TABLET | Freq: Every day | ORAL | 0 refills | Status: DC
Start: 2022-04-18 — End: 2022-12-19

## 2022-04-18 NOTE — Patient Instructions (Signed)

## 2022-04-18 NOTE — Progress Notes (Signed)
GYNECOLOGY ANNUAL PREVENTATIVE CARE ENCOUNTER NOTE  History:     Tamara Bishop is a 26 y.o. G10P2002 female here for a routine annual gynecologic exam.  Current complaints: difficulty sleeping due to stress and partner infertility .  Request swab for STD testing.   Denies abnormal vaginal bleeding, discharge, pelvic pain, problems with intercourse or other gynecologic concerns.     Social Relationship:  female partner Living: partner and children Work: stay at home mom Exercise: none  Smoke/Alcohol/drug use: denies   Gynecologic History No LMP recorded. Patient has had an injection. Contraception: Depo-Provera injections, last dose 04/08/22  Last Pap: 04/21/9020. Results were: normal  Last mammogram: n/a.    The pregnancy intention screening data noted above was reviewed. Potential methods of contraception were discussed. The patient elected to proceed with No data recorded.   Obstetric History OB History  Gravida Para Term Preterm AB Living  2 2 2  0 0 2  SAB IAB Ectopic Multiple Live Births  0 0 0 0 2    # Outcome Date GA Lbr Len/2nd Weight Sex Delivery Anes PTL Lv  2 Term 01/16/20 [redacted]w[redacted]d / 00:40 7 lb 7.9 oz (3.4 kg) M Vag-Spont EPI  LIV  1 Term 10/17/18 [redacted]w[redacted]d / 00:46 6 lb 8.8 oz (2.97 kg) F Vag-Spont EPI  LIV     Complications: Preeclampsia    Past Medical History:  Diagnosis Date   Abnormal uterine bleeding    Adjustment disorder with anxiety    Allergic rhinitis    Anemia    Anxiety    Cigarette smoker    Depression    Hypertension    Insomnia    Tobacco abuse     Past Surgical History:  Procedure Laterality Date   NO PAST SURGERIES     WISDOM TOOTH EXTRACTION      Current Outpatient Medications on File Prior to Visit  Medication Sig Dispense Refill   medroxyPROGESTERone (DEPO-PROVERA) 150 MG/ML injection Inject 1 mL (150 mg total) into the muscle every 3 (three) months. 1 mL 1   ondansetron (ZOFRAN-ODT) 4 MG disintegrating tablet Take 1 tablet (4  mg total) by mouth every 6 (six) hours as needed for nausea or vomiting. 20 tablet 0   Current Facility-Administered Medications on File Prior to Visit  Medication Dose Route Frequency Provider Last Rate Last Admin   medroxyPROGESTERone (DEPO-PROVERA) injection 150 mg  150 mg Intramuscular Q90 days [redacted]w[redacted]d, CNM   150 mg at 10/08/21 12/08/21    Allergies  Allergen Reactions   Azithromycin Other (See Comments)    Felt like her stomach was going to come out of her body    Social History:  reports that she quit smoking about 4 years ago. Her smoking use included cigarettes. She has a 0.50 pack-year smoking history. She has never used smokeless tobacco. She reports that she does not drink alcohol and does not use drugs.  Family History  Problem Relation Age of Onset   Lupus Mother    Diabetes Father    Breast cancer Maternal Grandmother    Cancer Maternal Grandmother         breast   Diabetes Paternal Grandmother    Diabetes Paternal Grandfather    Lupus Sister    Heart disease Maternal Grandfather    Ovarian cancer Neg Hx    Colon cancer Neg Hx     The following portions of the patient's history were reviewed and updated as appropriate: allergies, current medications, past family  history, past medical history, past social history, past surgical history and problem list.  Review of Systems Pertinent items noted in HPI and remainder of comprehensive ROS otherwise negative.  Physical Exam:  BP 108/74   Pulse 62   Ht 5\' 2"  (1.575 m)   Wt 144 lb 14.4 oz (65.7 kg)   BMI 26.50 kg/m  CONSTITUTIONAL: Well-developed, well-nourished female in no acute distress.  HENT:  Normocephalic, atraumatic, External right and left ear normal. Oropharynx is clear and moist EYES: Conjunctivae and EOM are normal. Pupils are equal, round, and reactive to light. No scleral icterus.  NECK: Normal range of motion, supple, no masses.  Normal thyroid.  SKIN: Skin is warm and dry. No rash noted. Not  diaphoretic. No erythema. No pallor. MUSCULOSKELETAL: Normal range of motion. No tenderness.  No cyanosis, clubbing, or edema.  2+ distal pulses. NEUROLOGIC: Alert and oriented to person, place, and time. Normal reflexes, muscle tone coordination.  PSYCHIATRIC: Normal mood and affect. Normal behavior. Normal judgment and thought content. CARDIOVASCULAR: Normal heart rate noted, regular rhythm RESPIRATORY: Clear to auscultation bilaterally. Effort and breath sounds normal, no problems with respiration noted. BREASTS: Symmetric in size. No masses, tenderness, skin changes, nipple drainage, or lymphadenopathy bilaterally.  ABDOMEN: Soft, no distention noted.  No tenderness, rebound or guarding.  PELVIC: Normal appearing external genitalia and urethral meatus; normal appearing vaginal mucosa and cervix.  No abnormal discharge noted.  Pap smear obtained.  Normal uterine size, no other palpable masses, no uterine or adnexal tenderness.  .   Assessment and Plan:    1. Women's annual routine gynecological examination  Pap: not due until 2024 Mammogram : n/a  Labs:  will do with next depo injecton, vaginal swab for STD screen Refills:  lexapro, ambien  Referral: none  Routine preventative health maintenance measures emphasized. Pt state she has tried over the counter meds to help with sleep, that have not worked. Encourages self help measure for sleep routine.  Please refer to After Visit Summary for other counseling recommendations.      Philip Aspen, CNM [facility]

## 2022-04-19 ENCOUNTER — Encounter: Payer: Self-pay | Admitting: Certified Nurse Midwife

## 2022-04-19 ENCOUNTER — Other Ambulatory Visit: Payer: Self-pay | Admitting: Certified Nurse Midwife

## 2022-04-19 LAB — CERVICOVAGINAL ANCILLARY ONLY
Bacterial Vaginitis (gardnerella): NEGATIVE
Candida Glabrata: NEGATIVE
Candida Vaginitis: NEGATIVE
Chlamydia: NEGATIVE
Comment: NEGATIVE
Comment: NEGATIVE
Comment: NEGATIVE
Comment: NEGATIVE
Comment: NEGATIVE
Comment: NORMAL
Neisseria Gonorrhea: NEGATIVE
Trichomonas: POSITIVE — AB

## 2022-04-19 MED ORDER — METRONIDAZOLE 500 MG PO TABS: 500.0000 mg | ORAL_TABLET | Freq: Two times a day (BID) | ORAL | 0 refills | Status: AC

## 2022-05-13 ENCOUNTER — Encounter: Payer: Self-pay | Admitting: Certified Nurse Midwife

## 2022-06-17 ENCOUNTER — Other Ambulatory Visit: Payer: Self-pay | Admitting: Certified Nurse Midwife

## 2022-07-04 ENCOUNTER — Ambulatory Visit: Payer: Medicaid Other

## 2022-07-04 ENCOUNTER — Other Ambulatory Visit: Payer: Medicaid Other

## 2022-07-05 ENCOUNTER — Other Ambulatory Visit: Payer: Medicaid Other

## 2022-07-05 ENCOUNTER — Ambulatory Visit (INDEPENDENT_AMBULATORY_CARE_PROVIDER_SITE_OTHER): Payer: Medicaid Other

## 2022-07-05 DIAGNOSIS — Z13220 Encounter for screening for lipoid disorders: Secondary | ICD-10-CM

## 2022-07-05 DIAGNOSIS — Z01419 Encounter for gynecological examination (general) (routine) without abnormal findings: Secondary | ICD-10-CM

## 2022-07-05 DIAGNOSIS — Z3042 Encounter for surveillance of injectable contraceptive: Secondary | ICD-10-CM

## 2022-07-05 MED ORDER — MEDROXYPROGESTERONE ACETATE 150 MG/ML IM SUSP
150.0000 mg | Freq: Once | INTRAMUSCULAR | Status: AC
  Administered 2022-07-05: 150 mg via INTRAMUSCULAR

## 2022-07-05 NOTE — Progress Notes (Signed)
Date last pap: 04/21/20. Last Depo-Provera: 04/08/2022 patient reports given at home by her mother, patint states that she advised CNM of this at last office visit 04/18/2022. Side Effects if any: None. Serum HCG indicated? N/A. Depo-Provera 150 mg IM given by: Nicholos Johns. [facility] Next appointment due 2/20-10/05/2022.

## 2022-07-05 NOTE — Patient Instructions (Signed)

## 2022-07-06 LAB — LIPID PANEL
Chol/HDL Ratio: 3.4 ratio (ref 0.0–4.4)
Cholesterol, Total: 131 mg/dL (ref 100–199)
HDL: 38 mg/dL — ABNORMAL LOW (ref >39)
LDL Chol Calc (NIH): 79 mg/dL (ref 0–99)
Triglycerides: 70 mg/dL (ref 0–149)
VLDL Cholesterol Cal: 14 mg/dL (ref 5–40)

## 2022-09-23 ENCOUNTER — Ambulatory Visit (INDEPENDENT_AMBULATORY_CARE_PROVIDER_SITE_OTHER): Payer: Medicaid Other

## 2022-09-23 VITALS — BP 111/73 | HR 66 | Ht 61.0 in | Wt 141.4 lb

## 2022-09-23 DIAGNOSIS — Z3042 Encounter for surveillance of injectable contraceptive: Secondary | ICD-10-CM | POA: Diagnosis not present

## 2022-09-23 MED ORDER — MEDROXYPROGESTERONE ACETATE 150 MG/ML IM SUSP
150.0000 mg | Freq: Once | INTRAMUSCULAR | Status: AC
  Administered 2022-09-23: 150 mg via INTRAMUSCULAR

## 2022-09-23 NOTE — Progress Notes (Signed)
    NURSE VISIT NOTE  Subjective:    Patient ID: FYNLEE CORIELL, female    DOB: 1996-06-23, 27 y.o.   MRN: [id number]  HPI  Patient is a 27 y.o. G61P2002 female who presents for depo provera injection.   Objective:    BP 111/73   Pulse 66   Ht 5' 1"$  (1.549 m)   Wt 141 lb 6.4 oz (64.1 kg)   BMI 26.72 kg/m   Last Annual: 04/18/22. Last pap: 04/21/20. Last Depo-Provera: 07/05/22. Side Effects if any: n/a. Serum HCG indicated?  N/a . Depo-Provera 150 mg IM given by: Douglass Rivers, CMA. Site: Right Upper Outer [place]andrant   Assessment:   1. Encounter for management and injection of depo-Provera      Plan:   Next appointment due between 12/10/22 and 12/24/22.    Marykay Lex, CMA

## 2022-10-02 ENCOUNTER — Encounter: Payer: Self-pay | Admitting: Emergency Medicine

## 2022-10-02 ENCOUNTER — Emergency Department
Admission: EM | Admit: 2022-10-02 | Discharge: 2022-10-02 | Disposition: A | Discharge status: Home / Self Care | Payer: Medicaid Other | Attending: Emergency Medicine | Admitting: Emergency Medicine

## 2022-10-02 ENCOUNTER — Emergency Department: Payer: Medicaid Other

## 2022-10-02 DIAGNOSIS — S61411A Laceration without foreign body of right hand, initial encounter: Secondary | ICD-10-CM | POA: Diagnosis not present

## 2022-10-02 DIAGNOSIS — W268XXA Contact with other sharp object(s), not elsewhere classified, initial encounter: Secondary | ICD-10-CM | POA: Insufficient documentation

## 2022-10-02 MED ORDER — LIDOCAINE HCL (PF) 1 % IJ SOLN
10.0000 mL | Freq: Once | INTRAMUSCULAR | Status: AC
  Administered 2022-10-02: 10 mL
  Filled 2022-10-02: qty 10

## 2022-10-02 NOTE — ED Provider Notes (Signed)
[facility] Provider Note  Patient Contact: 10:15 PM (approximate)   History   Extremity Laceration   HPI  Tamara Bishop is a 27 y.o. female who presents the emergency department complaining of a laceration to the right thenar eminence.  Patient states that she had placed a can in the trash can, forgot about it and attempted to push the trash out to compact.  Patient lacerated her thenar region inferior to the MCP joint.  Bleeding was controlled direct pressure.  Up-to-date on tetanus immunization.  No other injury or complaint.     Physical Exam   Triage Vital Signs: ED Triage Vitals  Enc Vitals Group     BP 10/02/22 1952 118/82     Pulse Rate 10/02/22 1952 62     Resp 10/02/22 1952 18     Temp 10/02/22 1952 98 F (36.7 C)     Temp Source 10/02/22 1952 Oral     SpO2 10/02/22 1952 100 %     Weight 10/02/22 1951 145 lb (65.8 kg)     Height 10/02/22 1951 '5\' 1"'$  (1.549 m)     Head Circumference --      Peak Flow --      Pain Score 10/02/22 1952 5     Pain Loc --      Pain Edu? --      Excl. in [place]? --     Most recent vital signs: Vitals:   10/02/22 1952  BP: 118/82  Pulse: 62  Resp: 18  Temp: 98 F (36.7 C)  SpO2: 100%     General: Alert and in no acute distress.  Cardiovascular:  Good peripheral perfusion Respiratory: Normal respiratory effort without tachypnea or retractions. Lungs CTAB.  Musculoskeletal: Full range of motion to all extremities.  Laceration of the thenar eminence measuring approximately 4 cm in length.  Edges are smooth in nature.  No active bleeding.  No visible foreign body.  Full range of motion to all digits of the hand.  Sensation and capillary refill intact all digits. Neurologic:  No gross focal neurologic deficits are appreciated.  Skin:   No rash noted Other:   ED Results / Procedures / Treatments   Labs (all labs ordered are listed, but only abnormal results are displayed) Labs Reviewed - No data to  display   EKG     RADIOLOGY  I personally viewed, evaluated, and interpreted these images as part of my medical decision making, as well as reviewing the written report by the radiologist.  ED Provider Interpretation: No acute osseous abnormality and no retained foreign body.  DG Hand Complete Right  Result Date: 10/02/2022 CLINICAL DATA:  Hand laceration, initial encounter EXAM: RIGHT HAND - COMPLETE 3+ VIEW COMPARISON:  None Available. FINDINGS: There is no evidence of fracture or dislocation. There is no evidence of arthropathy or other focal bone abnormality. Soft tissues are unremarkable. No soft tissue foreign body is noted. IMPRESSION: No acute abnormality noted. Electronically Signed   By: Inez Catalina M.D.   On: 10/02/2022 20:12    PROCEDURES:  Critical Care performed: No  ..Laceration Repair  Date/Time: 10/02/2022 10:20 PM  Performed by: Darletta Moll, PA-C Authorized by: Darletta Moll, PA-C   Consent:    Consent obtained:  Verbal   Consent given by:  Patient   Risks discussed:  Infection Universal protocol:    Procedure explained and questions answered to patient or proxy's satisfaction: yes     Immediately  prior to procedure, a time out was called: yes     Patient identity confirmed:  Verbally with patient Anesthesia:    Anesthesia method:  Local infiltration   Local anesthetic:  Lidocaine 1% w/o epi Laceration details:    Location:  Hand   Hand location:  R palm   Length (cm):  4 Exploration:    Hemostasis achieved with:  Direct pressure   Imaging obtained: x-ray     Imaging outcome: foreign body not noted     Wound exploration: wound explored through full range of motion and entire depth of wound visualized     Wound extent: no foreign body, no nerve damage, no tendon damage, no underlying fracture and no vascular damage   Treatment:    Area cleansed with:  Povidone-iodine and saline   Amount of cleaning:  Extensive   Irrigation solution:   Sterile saline   Irrigation volume:  500 ml   Irrigation method:  Pressure wash and syringe Skin repair:    Repair method:  Sutures   Suture size:  4-0   Suture material:  Nylon   Suture technique:  Simple interrupted   Number of sutures:  4 Approximation:    Approximation:  Close Repair type:    Repair type:  Simple Post-procedure details:    Procedure completion:  Tolerated well, no immediate complications    MEDICATIONS ORDERED IN ED: Medications  lidocaine (PF) (XYLOCAINE) 1 % injection 10 mL (10 mLs Infiltration Given by Other 10/02/22 2201)     IMPRESSION / MDM / [place] / ED COURSE  I reviewed the triage vital signs and the nursing notes.                                 Differential diagnosis includes, but is not limited to, hand lack, retained foreign body, vascular injury  Patient's presentation is most consistent with acute presentation with potential threat to life or bodily function.   Patient's diagnosis is consistent with hand lack.  Patient presents the emergency department with a laceration to the right hand.  Patient was trying to push trash compact sink when she lacerated the hand on a can.  Tetanus shot is up-to-date.  Wound is closed as described above.  Patient tolerated well.  Wound care instructions discussed with patient.  Follow-up primary care in a week for suture removal.. Patient is given ED precautions to return to the ED for any worsening or new symptoms.     FINAL CLINICAL IMPRESSION(S) / ED DIAGNOSES   Final diagnoses:  Laceration of right hand without foreign body, initial encounter     Rx / DC Orders   ED Discharge Orders     None        Note:  This document was prepared using Dragon voice recognition software and may include unintentional dictation errors.   Brynda Peon 10/02/22 2221    Lucillie Garfinkel, MD 10/03/22 2308

## 2022-10-02 NOTE — ED Triage Notes (Signed)
Pt presents ambulatory to triage via POV with complaints of R hand laceration after being cut with the lid of an alluminum can. Pt has a ~1 inch laceration; bleeding well controlled with gauze. Rates pain 5/10. Unsure if UTD on tetanus. A&Ox4 at this time. Denies CP or SOB.

## 2022-10-04 ENCOUNTER — Telehealth: Payer: Self-pay

## 2022-10-04 NOTE — Transitions of Care (Post Inpatient/ED Visit) (Signed)
   10/04/2022  Name: Tamara Bishop MRN: [id number] DOB: 05/18/1996  Today's TOC FU Call Status: Today's TOC FU Call Status:: Unsuccessul Call (1st Attempt) Unsuccessful Call (1st Attempt) Date: 10/04/22  Attempted to reach the patient regarding the most recent Inpatient/ED visit.  Follow Up Plan: Additional outreach attempts will be made to reach the patient to complete the Transitions of Care (Post Inpatient/ED visit) call.   Mickel Fuchs, BSW, [place] Managed Medicaid Team  [phone]

## 2022-10-06 ENCOUNTER — Telehealth: Payer: Self-pay

## 2022-10-06 NOTE — Transitions of Care (Post Inpatient/ED Visit) (Signed)
   10/06/2022  Name: Tamara Bishop MRN: [id number] DOB: 08/18/1995  Today's TOC FU Call Status: Today's TOC FU Call Status:: Successful TOC FU Call Competed TOC FU Call Complete Date: 10/06/22  Transition Care Management Follow-up Telephone Call How have you been since you were released from the hospital?: Better Any questions or concerns?: No  Items Reviewed: Did you receive and understand the discharge instructions provided?: Yes Any new allergies since your discharge?: No Dietary orders reviewed?: No Do you have support at home?: Yes People in Home: child(ren), dependent  Home Care and Equipment/Supplies: [place] Ordered?: NA Any new equipment or medical supplies ordered?: NA  Functional Questionnaire: Do you need assistance with bathing/showering or dressing?: No Do you need assistance with meal preparation?: No Do you need assistance with eating?: No Do you have difficulty maintaining continence: No Do you need assistance with getting out of bed/getting out of a chair/moving?: No Do you have difficulty managing or taking your medications?: No  Folllow up appointments reviewed: PCP Follow-up appointment confirmed?: No MD Provider Line [phone] Given: No Specialist Hospital Follow-up appointment confirmed?: No Reason Specialist Follow-Up Not Confirmed: Patient has Specialist Provider Number and will Call for Appointment Do you need transportation to your follow-up appointment?: No Do you understand care options if your condition(s) worsen?: Yes-patient verbalized understanding    Mickel Fuchs, BSW, [place] Medicaid Team  [phone]

## 2022-10-06 NOTE — Transitions of Care (Post Inpatient/ED Visit) (Signed)
   10/06/2022  Name: Tamara Bishop MRN: [id number] DOB: 05/16/1996  Today's TOC FU Call Status: Today's TOC FU Call Status:: Unsuccessful Call (2nd Attempt) Unsuccessful Call (2nd Attempt) Date: 10/06/22  Attempted to reach the patient regarding the most recent Inpatient/ED visit.  Follow Up Plan: Additional outreach attempts will be made to reach the patient to complete the Transitions of Care (Post Inpatient/ED visit) call.   Mickel Fuchs, BSW, [place] Managed Medicaid Team  [phone]

## 2022-12-16 ENCOUNTER — Ambulatory Visit: Payer: Medicaid Other

## 2022-12-19 ENCOUNTER — Ambulatory Visit (INDEPENDENT_AMBULATORY_CARE_PROVIDER_SITE_OTHER): Payer: Medicaid Other

## 2022-12-19 VITALS — BP 118/82 | HR 81 | Resp 16 | Ht 61.0 in | Wt 136.1 lb

## 2022-12-19 DIAGNOSIS — Z3042 Encounter for surveillance of injectable contraceptive: Secondary | ICD-10-CM

## 2022-12-19 MED ORDER — MEDROXYPROGESTERONE ACETATE 150 MG/ML IM SUSP
150.0000 mg | Freq: Once | INTRAMUSCULAR | Status: AC
  Administered 2022-12-19: 150 mg via INTRAMUSCULAR

## 2022-12-19 NOTE — Patient Instructions (Signed)

## 2022-12-19 NOTE — Progress Notes (Signed)
    NURSE VISIT NOTE  Subjective:    Patient ID: Tamara Bishop, female    DOB: 03-06-1996, 27 y.o.   MRN: [id number]  HPI  Patient is a 27 y.o. G56P2002 female who presents for depo provera injection.   Objective:    BP 118/82   Pulse 81   Resp 16   Ht 5\' 1"  (1.549 m)   Wt 136 lb 1.6 oz (61.7 kg)   BMI 25.72 kg/m   Last Annual: 04/18/22. Last pap: 04/21/20. Last Depo-Provera: 09/23/2022 Side Effects if any: n/a. Serum HCG indicated?  N/a . Depo-Provera 150 mg IM given by: Santiago Bumpers, CMA Site:    Lab Review  @THIS  VISIT ONLY@  Assessment:   1. Encounter for surveillance of injectable contraceptive      Plan:   Next appointment due between June 5 and June 19.    Santiago Bumpers, CMA [facility]

## 2023-01-12 ENCOUNTER — Encounter: Payer: Self-pay | Admitting: Certified Nurse Midwife

## 2023-01-12 ENCOUNTER — Ambulatory Visit: Payer: Medicaid Other | Admitting: Certified Nurse Midwife

## 2023-01-12 VITALS — BP 118/84 | HR 90 | Wt 134.1 lb

## 2023-01-12 DIAGNOSIS — R609 Edema, unspecified: Secondary | ICD-10-CM

## 2023-01-12 DIAGNOSIS — R109 Unspecified abdominal pain: Secondary | ICD-10-CM

## 2023-01-12 DIAGNOSIS — R42 Dizziness and giddiness: Secondary | ICD-10-CM

## 2023-01-12 DIAGNOSIS — R140 Abdominal distension (gaseous): Secondary | ICD-10-CM

## 2023-01-12 DIAGNOSIS — N6452 Nipple discharge: Secondary | ICD-10-CM

## 2023-01-12 DIAGNOSIS — L749 Eccrine sweat disorder, unspecified: Secondary | ICD-10-CM | POA: Diagnosis not present

## 2023-01-12 DIAGNOSIS — F410 Panic disorder [episodic paroxysmal anxiety] without agoraphobia: Secondary | ICD-10-CM

## 2023-01-12 NOTE — Patient Instructions (Addendum)
Vertigo Vertigo is the feeling that you or the things around you are moving when they are not. This feeling can come and go at any time. Vertigo often goes away on its own. This condition can be dangerous if it happens when you are doing activities like driving or working with machines. Your doctor will do tests to find the cause of your vertigo. These tests will also help your doctor decide on the best treatment for you. Follow these instructions at home: Eating and drinking     Drink enough fluid to keep your pee (urine) pale yellow. Do not drink alcohol. Activity Return to your normal activities when your doctor says that it is safe. In the morning, first sit up on the side of the bed. When you feel okay, stand slowly while you hold onto something until you know that your balance is fine. Move slowly. Avoid sudden body or head movements or certain positions, as told by your doctor. Use a cane if you have trouble standing or walking. Sit down right away if you feel dizzy. Avoid doing any tasks or activities that can cause danger to you or others if you get dizzy. Avoid bending down if you feel dizzy. Place items in your home so that they are easy for you to reach without bending or leaning over. Do not drive or use machinery if you feel dizzy. General instructions Take over-the-counter and prescription medicines only as told by your doctor. Keep all follow-up visits. Contact a doctor if: Your medicine does not help your vertigo. Your problems get worse or you have new symptoms. You have a fever. You feel like you may vomit (nauseous), or this feeling gets worse. You start to vomit. Your family or friends see changes in how you act. You lose feeling (have numbness) in part of your body. You feel prickling and tingling in a part of your body. Get help right away if: You are always dizzy. You faint. You get very bad headaches. You get a stiff neck. Bright light starts to bother  you. You have trouble moving or talking. You feel weak in your hands, arms, or legs. You have changes in your hearing or in how you see (vision). These symptoms may be an emergency. Get help right away. Call your local emergency services (911 in the [place] Do not wait to see if the symptoms will go away. Do not drive yourself to the hospital. Summary Vertigo is the feeling that you or the things around you are moving when they are not. Your doctor will do tests to find the cause of your vertigo. You may be told to avoid some tasks, positions, or movements. Contact a doctor if your medicine is not helping, or if you have a fever, new symptoms, or a change in how you act. Get help right away if you get very bad headaches, or if you have changes in how you speak, hear, or see. This information is not intended to replace advice given to you by your health care provider. Make sure you discuss any questions you have with your health care provider. Document Revised: 06/17/2020 Document Reviewed: 06/17/2020 Elsevier Patient Education  2024 [employer]  

## 2023-01-12 NOTE — Progress Notes (Signed)
GYN ENCOUNTER NOTE  Subjective:       Tamara Bishop is a 27 y.o. [id number] female is here for gynecologic evaluation of the following issues:  1. Stomach upset, bloating , middle upper abdomen swells  2. Dizzy spells 3. Anxiety, panic attacks 4.Nipple discharge    Gynecologic History No LMP recorded. Patient has had an injection. Contraception: Depo-Provera injections Last Pap: 04/21/20. Results were: normal Last mammogram: n/a.    Obstetric History OB History  Gravida Para Term Preterm AB Living  2 2 2  0 0 2  SAB IAB Ectopic Multiple Live Births  0 0 0 0 2    # Outcome Date GA Lbr Len/2nd Weight Sex Delivery Anes PTL Lv  2 Term 01/16/20 [redacted]w[redacted]d / 00:40 7 lb 7.9 oz (3.4 kg) M Vag-Spont EPI  LIV  1 Term 10/17/18 [redacted]w[redacted]d / 00:46 6 lb 8.8 oz (2.97 kg) F Vag-Spont EPI  LIV     Complications: Preeclampsia    Past Medical History:  Diagnosis Date   Abnormal uterine bleeding    Adjustment disorder with anxiety    Allergic rhinitis    Anemia    Anxiety    Cigarette smoker    Depression    Hypertension    Insomnia    Tobacco abuse     Past Surgical History:  Procedure Laterality Date   NO PAST SURGERIES     WISDOM TOOTH EXTRACTION      Current Outpatient Medications on File Prior to Visit  Medication Sig Dispense Refill   escitalopram (LEXAPRO) 20 MG tablet Take 1 tablet (20 mg total) by mouth daily. 90 tablet 3   medroxyPROGESTERone (DEPO-PROVERA) 150 MG/ML injection Inject 1 mL (150 mg total) into the muscle every 3 (three) months. 1 mL 1   No current facility-administered medications on file prior to visit.    Allergies  Allergen Reactions   Azithromycin Other (See Comments)    Felt like her stomach was going to come out of her body    Social History   Socioeconomic History   Marital status: Significant Other    Spouse name: Ernestina Patches   Number of children: Not on file   Years of education: Not on file   Highest education level: Not on file   Occupational History   Not on file  Tobacco Use   Smoking status: Former    Packs/day: 0.50    Years: 1.00    Additional pack years: 0.00    Total pack years: 0.50    Types: Cigarettes    Quit date: 2019    Years since quitting: 5.4   Smokeless tobacco: Never  Vaping Use   Vaping Use: Never used  Substance and Sexual Activity   Alcohol use: No   Drug use: No   Sexual activity: Yes    Birth control/protection: Injection    Comment: depo  Other Topics Concern   Not on file  Social History Narrative   Not on file   Social Determinants of Health   Financial Resource Strain: Low Risk  (10/16/2018)   Overall Financial Resource Strain (CARDIA)    Difficulty of Paying Living Expenses: Not hard at all  Food Insecurity: No Food Insecurity (10/06/2022)   Hunger Vital Sign    Worried About Running Out of Food in the Last Year: Never true    Ran Out of Food in the Last Year: Never true  Transportation Needs: No Transportation Needs (10/06/2022)   PRAPARE - Transportation  Lack of Transportation (Medical): No    Lack of Transportation (Non-Medical): No  Physical Activity: Unknown (10/16/2018)   Exercise Vital Sign    Days of Exercise per Week: 5 days    Minutes of Exercise per Session: Not on file  Stress: No Stress Concern Present (10/16/2018)   [employer] of Occupational Health - Occupational Stress Questionnaire    Feeling of Stress : Not at all  Social Connections: Socially Integrated (10/16/2018)   Social Connection and Isolation Panel [NHANES]    Frequency of Communication with Friends and Family: More than three times a week    Frequency of Social Gatherings with Friends and Family: More than three times a week    Attends Religious Services: More than 4 times per year    Active Member of [employer] or Organizations: Yes    Attends Banker Meetings: Never    Marital Status: Living with partner  Intimate Partner Violence: Not At Risk (10/06/2022)   Humiliation,  Afraid, Rape, and Kick questionnaire    Fear of Current or Ex-Partner: No    Emotionally Abused: No    Physically Abused: No    Sexually Abused: No    Family History  Problem Relation Age of Onset   Lupus Mother    Diabetes Father    Breast cancer Maternal Grandmother    Cancer Maternal Grandmother         breast   Diabetes Paternal Grandmother    Diabetes Paternal Grandfather    Lupus Sister    Heart disease Maternal Grandfather    Ovarian cancer Neg Hx    Colon cancer Neg Hx     The following portions of the patient's history were reviewed and updated as appropriate: allergies, current medications, past family history, past medical history, past social history, past surgical history and problem list.  Review of Systems Review of Systems - Negative except as mentioned in HPI Review of Systems - General ROS: negative for - chills, fatigue, fever, hot flashes, malaise or night sweats Hematological and Lymphatic ROS: negative for - bleeding problems or swollen lymph nodes Gastrointestinal ROS: negative for - abdominal pain, blood in stools, change in bowel habits and nausea/vomiting Musculoskeletal ROS: negative for - joint pain, muscle pain or muscular weakness Genito-Urinary ROS: negative for - change in menstrual cycle, dysmenorrhea, dyspareunia, dysuria, genital discharge, genital ulcers, hematuria, incontinence, irregular/heavy menses, nocturia or pelvic painjj  Objective:   BP 118/84   Pulse 90   Wt 134 lb 1.6 oz (60.8 kg)   BMI 25.34 kg/m  CONSTITUTIONAL: Well-developed, well-nourished female in no acute distress.  HENT:  Normocephalic, atraumatic.  NECK: Normal range of motion, supple, no masses.  Normal thyroid.  SKIN: Skin is warm and dry. No rash noted. Not diaphoretic. No erythema. No pallor. NEUROLGIC: Alert and oriented to person, place, and time. PSYCHIATRIC: Normal mood and affect. Normal behavior. Normal judgment and thought content. CARDIOVASCULAR:Not  Examined RESPIRATORY: Not Examined BREASTS:Breasts: breasts appear normal, no suspicious masses, no skin or nipple changes or axillary nodes. ABDOMEN: Soft, non distended; Non tender.  No Organomegaly. Pt states that by the end of the day the gall bladder region of her abdomen is distended.  PELVIC:not indicated MUSCULOSKELETAL: Normal range of motion. No tenderness.  No cyanosis, clubbing, or edema.     Assessment:   Abdominal bloating Stomach pain  Dizzy spell Nipple discharge  Anxiety panic attacks     Plan:   Pt states she is having panic attacks daily. She  notes that her relationship with her current partner is likely contributing to this. Orders placed for referral psychiatrist. Discussed possible cause for abdominal bloating/pain. Orders placed for referral to GI. Labs : prolactin level, CBC, Iron . Discussed minimal breast stimulation and good fitting bra. Will follow up with lab results.  Encouraged pt to return this year for pap.   [name], CNM

## 2023-01-14 LAB — CBC
Hematocrit: 42 % (ref 34.0–46.6)
Hemoglobin: 14.1 g/dL (ref 11.1–15.9)
MCH: 30.4 pg (ref 26.6–33.0)
MCHC: 33.6 g/dL (ref 31.5–35.7)
MCV: 91 fL (ref 79–97)
Platelets: 229 10*3/uL (ref 150–450)
RBC: 4.64 x10E6/uL (ref 3.77–5.28)
RDW: 13.1 % (ref 11.7–15.4)
WBC: 9.5 10*3/uL (ref 3.4–10.8)

## 2023-01-14 LAB — PROLACTIN: Prolactin: 10.6 ng/mL (ref 4.8–33.4)

## 2023-01-14 LAB — TSH+FREE T4
Free T4: 1.22 ng/dL (ref 0.82–1.77)
TSH: 0.957 u[IU]/mL (ref 0.450–4.500)

## 2023-01-14 LAB — FERRITIN: Ferritin: 52 ng/mL (ref 15–150)

## 2023-01-16 ENCOUNTER — Encounter: Payer: Self-pay | Admitting: Certified Nurse Midwife

## 2023-02-08 ENCOUNTER — Ambulatory Visit: Payer: Medicaid Other | Admitting: Certified Nurse Midwife

## 2023-02-22 ENCOUNTER — Ambulatory Visit: Payer: Medicaid Other | Admitting: Physician Assistant

## 2023-03-13 ENCOUNTER — Ambulatory Visit: Payer: Medicaid Other

## 2023-03-13 ENCOUNTER — Ambulatory Visit: Payer: Medicaid Other | Admitting: Certified Nurse Midwife

## 2023-03-15 ENCOUNTER — Ambulatory Visit (INDEPENDENT_AMBULATORY_CARE_PROVIDER_SITE_OTHER): Payer: Medicaid Other

## 2023-03-15 VITALS — BP 125/90 | HR 82 | Wt 135.0 lb

## 2023-03-15 DIAGNOSIS — Z3042 Encounter for surveillance of injectable contraceptive: Secondary | ICD-10-CM

## 2023-03-15 MED ORDER — MEDROXYPROGESTERONE ACETATE 150 MG/ML IM SUSY
150.0000 mg | PREFILLED_SYRINGE | Freq: Once | INTRAMUSCULAR | Status: AC
  Administered 2023-03-15: 150 mg via INTRAMUSCULAR

## 2023-03-15 NOTE — Progress Notes (Signed)
    NURSE VISIT NOTE  Subjective:    Patient ID: Tamara Bishop, female    DOB: 10-18-1995, 27 y.o.   MRN: [id number]  HPI  Patient is a 27 y.o. [id number] female who presents for depo provera injection.   Objective:    BP (!) 125/90   Pulse 82   Wt 135 lb (61.2 kg)   BMI 25.51 kg/m   Last Annual: 04/18/2022. Last pap: 04/21/2020. Last Depo-Provera: 12/19/2022. Side Effects if any: none Serum HCG indicated? No . Depo-Provera 150 mg IM given by: Doristine Devoid, CMA. Site: Left Upper Outer [place]andrant  Lab Review  @THIS  VISIT ONLY@  Assessment:   1. Encounter for surveillance of injectable contraceptive      Plan:   Next appointment due between 05/31/23 thru 06/14/23.    Burtis Junes, CMA

## 2023-03-27 ENCOUNTER — Ambulatory Visit (INDEPENDENT_AMBULATORY_CARE_PROVIDER_SITE_OTHER): Payer: Medicaid Other | Admitting: Psychiatry

## 2023-03-27 ENCOUNTER — Encounter: Payer: Self-pay | Admitting: Psychiatry

## 2023-03-27 VITALS — BP 128/80 | HR 76 | Temp 98.2°F | Ht 62.0 in | Wt 137.8 lb

## 2023-03-27 DIAGNOSIS — F411 Generalized anxiety disorder: Secondary | ICD-10-CM

## 2023-03-27 DIAGNOSIS — F32A Depression, unspecified: Secondary | ICD-10-CM

## 2023-03-27 DIAGNOSIS — R41840 Attention and concentration deficit: Secondary | ICD-10-CM | POA: Diagnosis not present

## 2023-03-27 MED ORDER — SERTRALINE HCL 25 MG PO TABS: 25.0000 mg | ORAL_TABLET | Freq: Every day | ORAL | 1 refills | Status: DC

## 2023-03-27 MED ORDER — HYDROXYZINE HCL 25 MG PO TABS: 12.5000 mg | ORAL_TABLET | Freq: Two times a day (BID) | ORAL | 0 refills | Status: DC | PRN
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
## 2023-03-27 NOTE — Patient Instructions (Addendum)
[email or web address] [facility] [address] [place], [place] [address]  [phone]    For counseling/CBT - please see list below [email or web address]  [email or web address]  [email or web address] [facility] [address], [place], [place] [address]   [phone]  [facility] [email or web address] [address], [place], [place] [address]  [phone]  [facility], [facility] [email or web address] [address] [place], [place] [address]  [phone]   [facility] - [phone]  [facility] - [phone]  [facility] - [phone] counseling [phone]  [facility] [phone]   [email or web address] this clinician can offer telehealth and has a sliding scale option  [email or web address] this group also offers sliding scale rates and is based out of [place]  Dr. Liborio Nixon with the [facility] specializes in divorce  [employer] [facility] has interns who offer sliding scale rates and some of the full time clinicians do, as well. You complete their contact form on their website and the referrals coordinator will help to get connected to someone   Medicaid below :  [facility] [address] [place], [place] [address]  [phone]    [employer] [address] [place], [place] [address]  [phone]    [facility] [address] [place], [place] [address]  [phone]     Hydroxyzine Capsules or Tablets What is this medication? HYDROXYZINE (hye DROX i zeen) treats the symptoms of allergies and allergic reactions. It may also be used to treat anxiety or cause drowsiness before a procedure. It works by blocking  histamine, a substance released by the body during an allergic reaction. It belongs to a group of medications called antihistamines. This medicine may be used for other purposes; ask your health care provider or pharmacist if you have questions. COMMON BRAND NAME(S): ANX, Atarax, Rezine, Vistaril What should I tell my care team before I take this medication? They need to know if you have any of these conditions: Glaucoma Heart disease Irregular heartbeat or rhythm Kidney disease Liver disease Lung or breathing disease, such as asthma Stomach or intestine problems Thyroid disease Trouble passing urine An unusual or allergic reaction to hydroxyzine, other medications, foods, dyes or preservatives Pregnant or trying to get pregnant Breastfeeding How should I use this medication? Take this medication by mouth with a full glass of water. Take it as directed on the prescription label at the same time every day. You can take it with or without food. If it upsets your stomach, take it with food. Talk to your care team about the use of this medication in children. While it may be prescribed for children as young as 6 years for selected conditions, precautions do apply. People 65 years and older may have a stronger reaction and need a smaller dose. Overdosage: If you think you have taken too much of this medicine contact a poison control center or emergency room at once. NOTE: This medicine is only for you. Do not share this medicine with others. What if I miss a dose? If you miss a dose, take it as soon as you can. If it is almost time for your next dose, take only that dose. Do not take double or extra doses. What may interact with this medication? Do not take this medication with any of the  following: Cisapride Dronedarone Pimozide Thioridazine This medication may also interact with the following: Alcohol Antihistamines for allergy, cough, and cold Atropine Barbiturate medications for  sleep or seizures, such as phenobarbital Certain antibiotics, such as erythromycin or clarithromycin Certain medications for anxiety or sleep Certain medications for bladder problems, such as oxybutynin or tolterodine Certain medications for irregular heartbeat Certain medications for mental health conditions Certain medications for Parkinson disease, such as benztropine, trihexyphenidyl Certain medications for seizures, such as phenobarbital or primidone Certain medications for stomach problems, such as dicyclomine or hyoscyamine Certain medications for travel sickness, such as scopolamine Ipratropium Opioid medications for pain Other medications that cause heart rhythm changes, such as dofetilide This list may not describe all possible interactions. Give your health care provider a list of all the medicines, herbs, non-prescription drugs, or dietary supplements you use. Also tell them if you smoke, drink alcohol, or use illegal drugs. Some items may interact with your medicine. What should I watch for while using this medication? Visit your care team for regular checks on your progress. Tell your care team if your symptoms do not start to get better or if they get worse. This medication may affect your coordination, reaction time, or judgment. Do not drive or operate machinery until you know how this medication affects you. Sit up or stand slowly to reduce the risk of dizzy or fainting spells. Drinking alcohol with this medication can increase the risk of these side effects. Your mouth may get dry. Chewing sugarless gum or sucking hard candy and drinking plenty of water may help. Contact your care team if the problem does not go away or is severe. This medication may cause dry eyes and blurred vision. If you wear contact lenses, you may feel some discomfort. Lubricating eye drops may help. See your care team if the problem does not go away or is severe. If you are receiving skin tests for  allergies, tell your care team you are taking this medication. What side effects may I notice from receiving this medication? Side effects that you should report to your care team as soon as possible: Allergic reactions--skin rash, itching, hives, swelling of the face, lips, tongue, or throat Heart rhythm changes--fast or irregular heartbeat, dizziness, feeling faint or lightheaded, chest pain, trouble breathing Side effects that usually do not require medical attention (report to your care team if they continue or are bothersome): Confusion Drowsiness Dry mouth Hallucinations Headache This list may not describe all possible side effects. Call your doctor for medical advice about side effects. You may report side effects to FDA at 1-800-FDA-1088. Where should I keep my medication? Keep out of the reach of children and pets. Store at room temperature between 15 and 30 degrees C (59 and 86 degrees F). Keep container tightly closed. Throw away any unused medication after the expiration date. NOTE: This sheet is a summary. It may not cover all possible information. If you have questions about this medicine, talk to your doctor, pharmacist, or health care provider.  2024 Elsevier/Gold Standard (2022-02-25 00:00:00) Sertraline Tablets What is this medication? SERTRALINE (SER tra leen) treats depression, anxiety, obsessive-compulsive disorder (OCD), post-traumatic stress disorder (PTSD), and premenstrual dysphoric disorder (PMDD). It increases the amount of serotonin in the brain, a hormone that helps regulate mood. It belongs to a group of medications called SSRIs. This medicine may be used for other purposes; ask your health care provider or pharmacist if you have questions. COMMON BRAND NAME(S): Zoloft What should I tell my care  team before I take this medication? They need to know if you have any of these conditions: Bleeding disorders Bipolar disorder or a family history of bipolar  disorder Frequently drink alcohol Glaucoma Heart disease High blood pressure History of irregular heartbeat History of low levels of calcium, magnesium, or potassium in the blood Liver disease Receiving electroconvulsive therapy Seizures Suicidal thoughts, plans, or attempt by you or a family member Take medications that prevent or treat blood clots Thyroid disease An unusual or allergic reaction to sertraline, other medications, foods, dyes, or preservatives Pregnant or trying to get pregnant Breastfeeding How should I use this medication? Take this medication by mouth with a glass of water. Take it as directed on the prescription label at the same time every day. You can take it with or without food. If it upsets your stomach, take it with food. Do not take your medication more often than directed. Keep taking this medication unless your care team tells you to stop. Stopping it too quickly can cause serious side effects. It can also make your condition worse. A special MedGuide will be given to you by the pharmacist with each prescription and refill. Be sure to read this information carefully each time. Talk to your care team about the use of this medication in children. While it may be prescribed for children as young as 7 years for selected conditions, precautions do apply. Overdosage: If you think you have taken too much of this medicine contact a poison control center or emergency room at once. NOTE: This medicine is only for you. Do not share this medicine with others. What if I miss a dose? If you miss a dose, take it as soon as you can. If it is almost time for your next dose, take only that dose. Do not take double or extra doses. What may interact with this medication? Do not take this medication with any of the following: Cisapride Dronedarone Linezolid MAOIs, such as Carbex, Eldepryl, Marplan, Nardil, and Parnate Methylene blue (injected into a  vein) Pimozide Thioridazine This medication may also interact with the following: Alcohol Amphetamines Aspirin and aspirin-like medications Certain medications for fungal infections, such as ketoconazole, fluconazole, posaconazole, itraconazole Certain medications for irregular heart beat, such as flecainide, quinidine, propafenone Certain medications for mental health conditions Certain medications for migraine headaches, such as almotriptan, eletriptan, frovatriptan, naratriptan, rizatriptan, sumatriptan, zolmitriptan Certain medications for seizures, such as carbamazepine, valproic acid, phenytoin Certain medications for sleep Certain medications that prevent or treat blood clots, such as warfarin, enoxaparin, dalteparin Cimetidine Digoxin Diuretics Fentanyl Isoniazid Lithium NSAIDs, medications for pain and inflammation, such as ibuprofen or naproxen Other medications that cause heart rhythm changes, such as dofetilide Rasagiline Safinamide Supplements, such as St. John's wort, kava kava, valerian Tolbutamide Tramadol Tryptophan This list may not describe all possible interactions. Give your health care provider a list of all the medicines, herbs, non-prescription drugs, or dietary supplements you use. Also tell them if you smoke, drink alcohol, or use illegal drugs. Some items may interact with your medicine. What should I watch for while using this medication? Tell your care team if your symptoms do not get better or if they get worse. Visit your care team for regular checks on your progress. Because it may take several weeks to see the full effects of this medication, it is important to continue your treatment as prescribed by your care team. Patients and their families should watch out for new or worsening thoughts of suicide or depression.  Also watch out for sudden changes in feelings such as feeling anxious, agitated, panicky, irritable, hostile, aggressive, impulsive,  severely restless, overly excited and hyperactive, or not being able to sleep. If this happens, especially at the beginning of treatment or after a change in dose, call your care team. This medication may affect your coordination, reaction time, or judgment. Do not drive or operate machinery until you know how this medication affects you. Sit or stand up slowly to reduce the risk of dizzy or fainting spells. Drinking alcohol with this medication can increase the risk of these side effects. Your mouth may get dry. Chewing sugarless gum or sucking hard candy, and drinking plenty of water may help. Contact your care team if the problem does not go away or is severe. What side effects may I notice from receiving this medication? Side effects that you should report to your care team as soon as possible: Allergic reactions--skin rash, itching, hives, swelling of the face, lips, tongue, or throat Bleeding--bloody or black, tar-like stools, red or dark brown urine, vomiting blood or brown material that looks like coffee grounds, small red or purple spots on skin, unusual bleeding or bruising Heart rhythm changes--fast or irregular heartbeat, dizziness, feeling faint or lightheaded, chest pain, trouble breathing Low sodium level--muscle weakness, fatigue, dizziness, headache, confusion Serotonin syndrome--irritability, confusion, fast or irregular heartbeat, muscle stiffness, twitching muscles, sweating, high fever, seizure, chills, vomiting, diarrhea Sudden eye pain or change in vision such as blurred vision, seeing halos around lights, vision loss Thoughts of suicide or self-harm, worsening mood Side effects that usually do not require medical attention (report these to your care team if they continue or are bothersome): Change in sex drive or performance Diarrhea Excessive sweating Nausea Tremors or shaking Upset stomach This list may not describe all possible side effects. Call your doctor for medical  advice about side effects. You may report side effects to FDA at 1-800-FDA-1088. Where should I keep my medication? Keep out of the reach of children and pets. Store at room temperature between 20 and 25 degrees C (68 and 77 degrees F). Get rid of any unused medication after the expiration date. To get rid of medications that are no longer needed or expired: Take the medication to a medication take-back program. Check with your pharmacy or law enforcement to find a location. If you cannot return the medication, check the label or package insert to see if the medication should be thrown out in the garbage or flushed down the toilet. If you are not sure, ask your care team. If it is safe to put in the trash, empty the medication out of the container. Mix the medication with cat litter, dirt, coffee grounds, or other unwanted substance. Seal the mixture in a bag or container. Put it in the trash. NOTE: This sheet is a summary. It may not cover all possible information. If you have questions about this medicine, talk to your doctor, pharmacist, or health care provider.  2024 Elsevier/Gold Standard (2022-02-15 00:00:00)

## 2023-04-12 ENCOUNTER — Encounter: Payer: Self-pay | Admitting: Certified Nurse Midwife

## 2023-04-12 ENCOUNTER — Other Ambulatory Visit (HOSPITAL_COMMUNITY)
Admission: RE | Admit: 2023-04-12 | Discharge: 2023-04-12 | Disposition: A | Discharge status: Home / Self Care | Payer: Medicaid Other | Source: Ambulatory Visit | Attending: Certified Nurse Midwife | Admitting: Certified Nurse Midwife

## 2023-04-12 ENCOUNTER — Ambulatory Visit (INDEPENDENT_AMBULATORY_CARE_PROVIDER_SITE_OTHER): Payer: Medicaid Other | Admitting: Certified Nurse Midwife

## 2023-04-12 VITALS — BP 114/70 | HR 69 | Ht 62.0 in | Wt 138.4 lb

## 2023-04-12 DIAGNOSIS — Z01419 Encounter for gynecological examination (general) (routine) without abnormal findings: Secondary | ICD-10-CM

## 2023-04-12 DIAGNOSIS — Z124 Encounter for screening for malignant neoplasm of cervix: Secondary | ICD-10-CM | POA: Diagnosis not present

## 2023-04-12 DIAGNOSIS — Z113 Encounter for screening for infections with a predominantly sexual mode of transmission: Secondary | ICD-10-CM

## 2023-04-12 MED ORDER — MEDROXYPROGESTERONE ACETATE 150 MG/ML IM SUSP
150.0000 mg | INTRAMUSCULAR | 3 refills | Status: DC
Start: 2023-04-12 — End: 2023-11-24

## 2023-04-12 NOTE — Progress Notes (Signed)
a        GYNECOLOGY ANNUAL PREVENTATIVE CARE ENCOUNTER NOTE  History:     Tamara Bishop is a 27 y.o. G29P2002 female here for a routine annual gynecologic exam.  Current complaints: none.   Denies abnormal vaginal bleeding, discharge, pelvic pain, problems with intercourse or other gynecologic concerns.     Social Relationship: female partner  Living: with partner and children  Work: stay at home mom  Exercise:  active with kids  Smoke/Alcohol/drug use: denies use   Gynecologic History No LMP recorded. Patient has had an injection. Contraception: Depo-Provera injections Last Pap: 04/21/20. Results were: normal  Last mammogram: n/a .   Upstream - 04/12/23 0920       Pregnancy Intention Screening   Does the patient want to become pregnant in the next year? No    Does the patient's partner want to become pregnant in the next year? No    Would the patient like to discuss contraceptive options today? No      Contraception Wrap Up   Current Method Hormonal Injection    End Method Hormonal Injection    Contraception Counseling Provided No            The pregnancy intention screening data noted above was reviewed. Potential methods of contraception were discussed. The patient elected to proceed with Hormonal Injection.   Obstetric History OB History  Gravida Para Term Preterm AB Living  2 2 2  0 0 2  SAB IAB Ectopic Multiple Live Births  0 0 0 0 2    # Outcome Date GA Lbr Len/2nd Weight Sex Type Anes PTL Lv  2 Term 01/16/20 [redacted]w[redacted]d / 00:40 7 lb 7.9 oz (3.4 kg) M Vag-Spont EPI  LIV  1 Term 10/17/18 [redacted]w[redacted]d / 00:46 6 lb 8.8 oz (2.97 kg) F Vag-Spont EPI  LIV     Complications: Preeclampsia    Past Medical History:  Diagnosis Date   Abnormal uterine bleeding    Adjustment disorder with anxiety    Allergic rhinitis    Anemia    Anxiety    Cigarette smoker    Depression    Hypertension    Insomnia    Tobacco abuse     Past Surgical History:  Procedure  Laterality Date   NO PAST SURGERIES     WISDOM TOOTH EXTRACTION      Current Outpatient Medications on File Prior to Visit  Medication Sig Dispense Refill   hydrOXYzine (ATARAX) 25 MG tablet Take 0.5-1 tablets (12.5-25 mg total) by mouth 2 (two) times daily as needed for anxiety (and sleep). 30 tablet 0   sertraline (ZOLOFT) 25 MG tablet Take 1 tablet (25 mg total) by mouth daily with breakfast. 30 tablet 1   No current facility-administered medications on file prior to visit.    Allergies  Allergen Reactions   Azithromycin Other (See Comments)    Felt like her stomach was going to come out of her body    Social History:  reports that she quit smoking about 5 years ago. Her smoking use included cigarettes. She started smoking about 6 years ago. She has a 0.5 pack-year smoking history. She has never used smokeless tobacco. She reports that she does not drink alcohol and does not use drugs.  Family History  Problem Relation Age of Onset   Lupus Mother    Diabetes Father    Lupus Sister    Heart disease Maternal Grandfather    Breast cancer Maternal Grandmother  Cancer Maternal Grandmother         breast   Diabetes Paternal Grandfather    Diabetes Paternal Grandmother    Ovarian cancer Neg Hx    Colon cancer Neg Hx    Mental illness Neg Hx     The following portions of the patient's history were reviewed and updated as appropriate: allergies, current medications, past family history, past medical history, past social history, past surgical history and problem list.  Review of Systems Pertinent items noted in HPI and remainder of comprehensive ROS otherwise negative.  Physical Exam:  BP (!) 145/88   Pulse (!) 106   Ht 5\' 2"  (1.575 m)   Wt 138 lb 6.4 oz (62.8 kg)   BMI 25.31 kg/m  CONSTITUTIONAL: Well-developed, well-nourished female in no acute distress.  HENT:  Normocephalic, atraumatic, External right and left ear normal. Oropharynx is clear and moist EYES:  Conjunctivae and EOM are normal. Pupils are equal, round, and reactive to light. No scleral icterus.  NECK: Normal range of motion, supple, no masses.  Normal thyroid.  SKIN: Skin is warm and dry. No rash noted. Not diaphoretic. No erythema. No pallor. MUSCULOSKELETAL: Normal range of motion. No tenderness.  No cyanosis, clubbing, or edema.  2+ distal pulses. NEUROLOGIC: Alert and oriented to person, place, and time. Normal reflexes, muscle tone coordination.  PSYCHIATRIC: Normal mood and affect. Normal behavior. Normal judgment and thought content. CARDIOVASCULAR: Normal heart rate noted, regular rhythm RESPIRATORY: Clear to auscultation bilaterally. Effort and breath sounds normal, no problems with respiration noted. BREASTS: Symmetric in size. No masses, tenderness, skin changes, nipple drainage, or lymphadenopathy bilaterally.  ABDOMEN: Soft, no distention noted.  No tenderness, rebound or guarding.  PELVIC: Normal appearing external genitalia and urethral meatus; normal appearing vaginal mucosa and cervix.  No abnormal discharge noted.  Pap smear obtained. Swab collected.  Normal uterine size, no other palpable masses, no uterine or adnexal tenderness.  .   Assessment and Plan:    1. Women's annual routine gynecological examination  Pap: Will follow up results of pap smear and manage accordingly. Mammogram : n/a  Labs:  vaginal swab Refills: depo Referral: none  Routine preventative health maintenance measures emphasized. Please refer to After Visit Summary for other counseling recommendations.      [name], CNM [facility]  [facility],  [facility]

## 2023-04-13 ENCOUNTER — Other Ambulatory Visit: Payer: Self-pay | Admitting: Certified Nurse Midwife

## 2023-04-13 ENCOUNTER — Encounter: Payer: Self-pay | Admitting: Certified Nurse Midwife

## 2023-04-13 LAB — CERVICOVAGINAL ANCILLARY ONLY
Bacterial Vaginitis (gardnerella): NEGATIVE
Candida Glabrata: NEGATIVE
Candida Vaginitis: POSITIVE — AB
Chlamydia: NEGATIVE
Comment: NEGATIVE
Comment: NEGATIVE
Comment: NEGATIVE
Comment: NEGATIVE
Comment: NEGATIVE
Comment: NORMAL
Neisseria Gonorrhea: NEGATIVE
Trichomonas: NEGATIVE

## 2023-04-13 MED ORDER — FLUCONAZOLE 150 MG PO TABS: 150.0000 mg | ORAL_TABLET | Freq: Once | ORAL | 0 refills | Status: AC

## 2023-04-21 LAB — CYTOLOGY - PAP: Diagnosis: NEGATIVE

## 2023-05-01 ENCOUNTER — Telehealth (INDEPENDENT_AMBULATORY_CARE_PROVIDER_SITE_OTHER): Payer: Medicaid Other | Admitting: Psychiatry

## 2023-05-01 ENCOUNTER — Encounter: Payer: Self-pay | Admitting: Psychiatry

## 2023-05-01 DIAGNOSIS — F320 Major depressive disorder, single episode, mild: Secondary | ICD-10-CM

## 2023-05-01 DIAGNOSIS — F411 Generalized anxiety disorder: Secondary | ICD-10-CM | POA: Diagnosis not present

## 2023-05-01 DIAGNOSIS — R41840 Attention and concentration deficit: Secondary | ICD-10-CM | POA: Diagnosis not present

## 2023-05-01 MED ORDER — HYDROXYZINE HCL 25 MG PO TABS: 12.5000 mg | ORAL_TABLET | Freq: Two times a day (BID) | ORAL | 1 refills | Status: DC | PRN

## 2023-05-01 MED ORDER — SERTRALINE HCL 50 MG PO TABS: 50.0000 mg | ORAL_TABLET | Freq: Every day | ORAL | 0 refills | Status: DC

## 2023-05-01 NOTE — Progress Notes (Signed)
Virtual Visit via Video Note  I connected with Tamara Bishop on 05/01/23 at 11:30 AM EDT by a video enabled telemedicine application and verified that I am speaking with the correct person using two identifiers.  Location Provider Location : [facility] Patient Location : Home  Participants: Patient , Provider    I discussed the limitations of evaluation and management by telemedicine and the availability of in person appointments. The patient expressed understanding and agreed to proceed.   I discussed the assessment and treatment plan with the patient. The patient was provided an opportunity to ask questions and all were answered. The patient agreed with the plan and demonstrated an understanding of the instructions.   The patient was advised to call back or seek an in-person evaluation if the symptoms worsen or if the condition fails to improve as anticipated.  BH MD OP Progress Note  05/01/2023 12:02 PM SWAYZE KOZUCH  MRN:  [id number]  Chief Complaint:  Chief Complaint  Patient presents with   Follow-up   Anxiety   Depression   Medication Refill   HPI: Tamara Bishop is a 27 year old Caucasian female engaged, stay-at-home mom, lives in [place], has a history of GAD, MDD, attention and concentration deficit was evaluated by telemedicine today.  Patient today reports she continues to have a lot of situational stressors.  Her fianc does not have a stable job at this time and has pending legal issues.  She is unable to work since her children needs childcare and that is expensive if she were to go out and work.  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  Patient however reports she has noticed good improvement on the Zoloft 25 mg.  She had continues to have episodes of sadness, low energy, low mood, concentration problems.  Interested in increasing the dosage to 50 mg.  Patient reports sleep is overall okay since  being on the hydroxyzine.  Denies side effects.  Patient continues to have anxiety, worrying about her current stressors.  She has been unable to find a therapist however will be motivated to do so.  She continues to have attention and focus problems however has not heard back from [facility] where she was referred last visit for ADHD testing.  Patient agrees to reach out to them.  Patient denies any suicidality, homicidality or perceptual disturbances.  Patient denies any other concerns today.  Visit Diagnosis:    ICD-10-CM   1. GAD (generalized anxiety disorder)  F41.1 sertraline (ZOLOFT) 50 MG tablet    hydrOXYzine (ATARAX) 25 MG tablet    2. Current mild episode of major depressive disorder without prior episode (HCC)  F32.0 sertraline (ZOLOFT) 50 MG tablet    3. Attention and concentration deficit  R41.840       Past Psychiatric History: I have reviewed past psychiatric history from progress note on 03/27/2023.  Past trials of Lexapro-noncompliant, Paxil-side effect.  Past Medical History:  Past Medical History:  Diagnosis Date   Abnormal uterine bleeding    Adjustment disorder with anxiety    Allergic rhinitis    Anemia    Anxiety    Cigarette smoker    Depression    Hypertension    Insomnia    Tobacco abuse     Past Surgical History:  Procedure Laterality Date   NO PAST SURGERIES     WISDOM TOOTH EXTRACTION  Family Psychiatric History: I have reviewed family psychiatric history from progress note on 03/27/2023.  Family History:  Family History  Problem Relation Age of Onset   Lupus Mother    Diabetes Father    Lupus Sister    Heart disease Maternal Grandfather    Breast cancer Maternal Grandmother    Cancer Maternal Grandmother         breast   Diabetes Paternal Grandfather    Diabetes Paternal Grandmother    Ovarian cancer Neg Hx    Colon cancer Neg Hx    Mental illness Neg Hx     Social History: I have reviewed social history  from progress note on 03/27/2023. Social History   Socioeconomic History   Marital status: Significant Other    Spouse name: Ernestina Patches   Number of children: 2   Years of education: Not on file   Highest education level: High school graduate  Occupational History   Not on file  Tobacco Use   Smoking status: Former    Current packs/day: 0.00    Average packs/day: 0.5 packs/day for 1 year (0.5 ttl pk-yrs)    Types: Cigarettes    Start date: 2018    Quit date: 2019    Years since quitting: 5.7   Smokeless tobacco: Never  Vaping Use   Vaping status: Some Days  Substance and Sexual Activity   Alcohol use: No   Drug use: No   Sexual activity: Yes    Birth control/protection: Injection    Comment: depo  Other Topics Concern   Not on file  Social History Narrative   Not on file   Social Determinants of Health   Financial Resource Strain: Low Risk  (10/16/2018)   Overall Financial Resource Strain (CARDIA)    Difficulty of Paying Living Expenses: Not hard at all  Food Insecurity: No Food Insecurity (10/06/2022)   Hunger Vital Sign    Worried About Running Out of Food in the Last Year: Never true    Ran Out of Food in the Last Year: Never true  Transportation Needs: No Transportation Needs (10/06/2022)   PRAPARE - Administrator, Civil Service (Medical): No    Lack of Transportation (Non-Medical): No  Physical Activity: Unknown (10/16/2018)   Exercise Vital Sign    Days of Exercise per Week: 5 days    Minutes of Exercise per Session: Not on file  Stress: No Stress Concern Present (10/16/2018)   [employer] of Occupational Health - Occupational Stress Questionnaire    Feeling of Stress : Not at all  Social Connections: Socially Integrated (10/16/2018)   Social Connection and Isolation Panel [NHANES]    Frequency of Communication with Friends and Family: More than three times a week    Frequency of Social Gatherings with Friends and Family: More than three times a  week    Attends Religious Services: More than 4 times per year    Active Member of [employer] or Organizations: Yes    Attends Banker Meetings: Never    Marital Status: Living with partner    Allergies:  Allergies  Allergen Reactions   Azithromycin Other (See Comments)    Felt like her stomach was going to come out of her body    Metabolic Disorder Labs: No results found for: "HGBA1C", "MPG" Lab Results  Component Value Date   PROLACTIN 10.6 01/12/2023   Lab Results  Component Value Date   CHOL 131 07/05/2022   TRIG 70 07/05/2022  HDL 38 (L) 07/05/2022   CHOLHDL 3.4 07/05/2022   LDLCALC 79 07/05/2022   LDLCALC 78 06/02/2015   Lab Results  Component Value Date   TSH 0.957 01/12/2023   TSH 1.250 04/16/2019    Therapeutic Level Labs: No results found for: "LITHIUM" No results found for: "VALPROATE" No results found for: "CBMZ"  Current Medications: Current Outpatient Medications  Medication Sig Dispense Refill   sertraline (ZOLOFT) 50 MG tablet Take 1 tablet (50 mg total) by mouth daily with breakfast. 90 tablet 0   hydrOXYzine (ATARAX) 25 MG tablet Take 0.5-1 tablets (12.5-25 mg total) by mouth 2 (two) times daily as needed for anxiety (and sleep). 180 tablet 1   medroxyPROGESTERone (DEPO-PROVERA) 150 MG/ML injection Inject 1 mL (150 mg total) into the muscle every 3 (three) months. 1 mL 3   No current facility-administered medications for this visit.     Musculoskeletal: Strength & Muscle Tone:  UTA Gait & Station:  Seated Patient leans: N/A  Psychiatric Specialty Exam: Review of Systems  Psychiatric/Behavioral:  Positive for dysphoric mood. The patient is nervous/anxious.     There were no vitals taken for this visit.There is no height or weight on file to calculate BMI.  General Appearance: Fairly Groomed  Eye Contact:  Fair  Speech:  Clear and Coherent  Volume:  Normal  Mood:  Anxious and Depressed  Affect:  Congruent  Thought Process:   Goal Directed and Descriptions of Associations: Intact  Orientation:  Full (Time, Place, and Person)  Thought Content: Logical   Suicidal Thoughts:  No  Homicidal Thoughts:  No  Memory:  Immediate;   Fair Recent;   Fair Remote;   Fair  Judgement:  Fair  Insight:  Fair  Psychomotor Activity:  Normal  Concentration:  Concentration: Fair and Attention Span: Fair  Recall:  [employer] of Knowledge: Fair  Language: Fair  Akathisia:  No  Handed:  Right  AIMS (if indicated): not done  Assets:  Communication Skills Desire for Improvement Housing Social Support  ADL's:  Intact  Cognition: WNL  Sleep:   improving   Screenings: GAD-7    Flowsheet Row Office Visit from 03/27/2023 in [facility] Procedure visit from 01/31/2020 in [facility] Office Visit from 01/27/2020 in [facility] Routine Prenatal from 10[facility]10/2019 [facility] Office Visit from 11/23/2018 in [facility]  Total GAD-7 Score 16 2 19  0 9      PHQ2-9    Flowsheet Row Video Visit from 05/01/2023 in [facility] Office Visit from 03/27/2023 in [facility] Office Visit from 02/28/2020 in [facility] Procedure visit from 01/31/2020 [facility] Office Visit from 01/27/2020 in [facility]  PHQ-2 Total Score 3 4 0 0 4  PHQ-9 Total Score 9 17 1 1 15       Flowsheet Row Video Visit from 05/01/2023 in [facility] Office Visit from 03/27/2023 in [facility] ED from 10/02/2022 in [facility]  C-SSRS RISK CATEGORY No Risk No Risk No Risk        Assessment and Plan: Tamara Bishop is a 27 year old Caucasian female who has a history of anxiety, depression, attention and focus deficit was evaluated by telemedicine today.  Patient  with continued situational stressors, depression and anxiety symptoms although improving on the Zoloft 25 mg, will benefit  from dosage increase.  She will also benefit from ADHD testing, discussed plan as noted below.  Plan  GAD-improving Increase Zoloft to 50 mg p.o. daily with breakfast Hydroxyzine 12.5-25 mg p.o. twice daily as needed for severe anxiety attacks Patient to establish care with therapist, provided resources.  MDD-improving Increase Zoloft to 50 mg p.o. daily Hydroxyzine as prescribed as noted above, for sleep as needed. Continue sleep hygiene techniques Patient to establish care with therapist Patient provided resources.  Attention and concentration deficit-unstable Patient was referred to [facility]-pending.  Patient provided phone number to reach out to them.  Follow-up in clinic in 6 to 8 weeks or sooner if needed.  Collaboration of Care: Collaboration of Care: Referral or follow-up with counselor/therapist AEB patient encouraged to establish care with therapist.  Patient/Guardian was advised Release of Information must be obtained prior to any record release in order to collaborate their care with an outside provider. Patient/Guardian was advised if they have not already done so to contact the registration department to sign all necessary forms in order for [place] to release information regarding their care.   Consent: Patient/Guardian gives verbal consent for treatment and assignment of benefits for services provided during this visit. Patient/Guardian expressed understanding and agreed to proceed.   This note was generated in part or whole with voice recognition software. Voice recognition is usually quite accurate but there are transcription errors that can and very often do occur. I apologize for any typographical errors that were not detected and corrected.    Jomarie Longs, MD 05/01/2023, 12:02 PM

## 2023-06-07 ENCOUNTER — Ambulatory Visit: Payer: Medicaid Other

## 2023-06-12 ENCOUNTER — Telehealth: Payer: Medicaid Other | Admitting: Psychiatry

## 2023-06-16 ENCOUNTER — Encounter: Payer: Self-pay | Admitting: Psychiatry

## 2023-06-16 ENCOUNTER — Telehealth (INDEPENDENT_AMBULATORY_CARE_PROVIDER_SITE_OTHER): Payer: Medicaid Other | Admitting: Psychiatry

## 2023-06-16 DIAGNOSIS — F3342 Major depressive disorder, recurrent, in full remission: Secondary | ICD-10-CM | POA: Diagnosis not present

## 2023-06-16 DIAGNOSIS — F411 Generalized anxiety disorder: Secondary | ICD-10-CM

## 2023-06-16 DIAGNOSIS — R41840 Attention and concentration deficit: Secondary | ICD-10-CM | POA: Diagnosis not present

## 2023-06-16 NOTE — Progress Notes (Signed)
Virtual Visit via Video Note  I connected with Tamara Bishop on 06/16/23 at  9:30 AM EST by a video enabled telemedicine application and verified that I am speaking with the correct person using two identifiers.  Location Provider Location : [facility] Patient Location : Home  Participants: Patient , Provider    I discussed the limitations of evaluation and management by telemedicine and the availability of in person appointments. The patient expressed understanding and agreed to proceed.   I discussed the assessment and treatment plan with the patient. The patient was provided an opportunity to ask questions and all were answered. The patient agreed with the plan and demonstrated an understanding of the instructions.   The patient was advised to call back or seek an in-person evaluation if the symptoms worsen or if the condition fails to improve as anticipated.   BH MD OP Progress Note  06/16/2023 9:55 AM Tamara Bishop  MRN:  [id number]  Chief Complaint:  Chief Complaint  Patient presents with   Follow-up   Depression   Anxiety   Medication Refill   HPI: Tamara Bishop is a 27 year old Caucasian female, engaged, stay-at-home mom, lives in [place], has a history of GAD, MDD, attention and concentration deficit was evaluated by telemedicine today.  Patient reports she is currently tolerating the sertraline 50 mg.  She is compliant on it.  The sertraline does help with her depression and anxiety symptoms.  Patient reports although she does have situational stressors it is more manageable.  She continues to have this feeling of ' impending doom' and something bad does really happen when she has these feelings.  Patient currently does not have a therapist.  Patient also does not have a job and she is stay-at-home mom.  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  She  reports she has started baking sourdough bread for her family and that has been therapeutic for herself.  Patient motivated to start working on relaxation techniques, mindfulness strategies herself.  Patient reports sleep is overall good on the hydroxyzine as needed.  Patient denies any suicidality, homicidality or perceptual disturbances.  Patient reports she and her kids are currently struggling with upper respiratory tract infection symptoms.    She has not heard from [facility] although she tried to call them several times.  Patient denies any other concerns today.  Visit Diagnosis:    ICD-10-CM   1. GAD (generalized anxiety disorder)  F41.1     2. MDD (major depressive disorder), recurrent, in full remission (HCC)  F33.42     3. Attention and concentration deficit  R41.840 Ambulatory referral to Neuropsychology      Past Psychiatric History: I have reviewed past psychiatric history from progress note on 03/27/2023.  Past trials of Lexapro-noncompliant, Paxil-side effect.  Past Medical History:  Past Medical History:  Diagnosis Date   Abnormal uterine bleeding    Adjustment disorder with anxiety    Allergic rhinitis    Anemia    Anxiety    Cigarette smoker    Depression    Hypertension    Insomnia    Tobacco abuse     Past Surgical History:  Procedure Laterality Date   NO PAST SURGERIES     WISDOM TOOTH EXTRACTION      Family Psychiatric History: I have reviewed family psychiatric history from progress note  on 03/27/2023.  Family History:  Family History  Problem Relation Age of Onset   Lupus Mother    Diabetes Father    Lupus Sister    Heart disease Maternal Grandfather    Breast cancer Maternal Grandmother    Cancer Maternal Grandmother         breast   Diabetes Paternal Grandfather    Diabetes Paternal Grandmother    Ovarian cancer Neg Hx    Colon cancer Neg Hx    Mental illness Neg Hx     Social History: I have reviewed social  history from progress note on 03/27/2023. Social History   Socioeconomic History   Marital status: Significant Other    Spouse name: Ernestina Patches   Number of children: 2   Years of education: Not on file   Highest education level: High school graduate  Occupational History   Not on file  Tobacco Use   Smoking status: Former    Current packs/day: 0.00    Average packs/day: 0.5 packs/day for 1 year (0.5 ttl pk-yrs)    Types: Cigarettes    Start date: 2018    Quit date: 2019    Years since quitting: 5.8   Smokeless tobacco: Never  Vaping Use   Vaping status: Some Days  Substance and Sexual Activity   Alcohol use: No   Drug use: No   Sexual activity: Yes    Birth control/protection: Injection    Comment: depo  Other Topics Concern   Not on file  Social History Narrative   Not on file   Social Determinants of Health   Financial Resource Strain: Low Risk  (10/16/2018)   Overall Financial Resource Strain (CARDIA)    Difficulty of Paying Living Expenses: Not hard at all  Food Insecurity: No Food Insecurity (10/06/2022)   Hunger Vital Sign    Worried About Running Out of Food in the Last Year: Never true    Ran Out of Food in the Last Year: Never true  Transportation Needs: No Transportation Needs (10/06/2022)   PRAPARE - Administrator, Civil Service (Medical): No    Lack of Transportation (Non-Medical): No  Physical Activity: Unknown (10/16/2018)   Exercise Vital Sign    Days of Exercise per Week: 5 days    Minutes of Exercise per Session: Not on file  Stress: No Stress Concern Present (10/16/2018)   [employer] of Occupational Health - Occupational Stress Questionnaire    Feeling of Stress : Not at all  Social Connections: Socially Integrated (10/16/2018)   Social Connection and Isolation Panel [NHANES]    Frequency of Communication with Friends and Family: More than three times a week    Frequency of Social Gatherings with Friends and Family: More than three  times a week    Attends Religious Services: More than 4 times per year    Active Member of [employer] or Organizations: Yes    Attends Banker Meetings: Never    Marital Status: Living with partner    Allergies:  Allergies  Allergen Reactions   Azithromycin Other (See Comments)    Felt like her stomach was going to come out of her body    Metabolic Disorder Labs: No results found for: "HGBA1C", "MPG" Lab Results  Component Value Date   PROLACTIN 10.6 01/12/2023   Lab Results  Component Value Date   CHOL 131 07/05/2022   TRIG 70 07/05/2022   HDL 38 (L) 07/05/2022   CHOLHDL 3.4 07/05/2022  LDLCALC 79 07/05/2022   LDLCALC 78 06/02/2015   Lab Results  Component Value Date   TSH 0.957 01/12/2023   TSH 1.250 04/16/2019    Therapeutic Level Labs: No results found for: "LITHIUM" No results found for: "VALPROATE" No results found for: "CBMZ"  Current Medications: Current Outpatient Medications  Medication Sig Dispense Refill   fluconazole (DIFLUCAN) 150 MG tablet Take 150 mg by mouth once.     hydrOXYzine (ATARAX) 25 MG tablet Take 0.5-1 tablets (12.5-25 mg total) by mouth 2 (two) times daily as needed for anxiety (and sleep). 180 tablet 1   medroxyPROGESTERone (DEPO-PROVERA) 150 MG/ML injection Inject 1 mL (150 mg total) into the muscle every 3 (three) months. 1 mL 3   sertraline (ZOLOFT) 50 MG tablet Take 1 tablet (50 mg total) by mouth daily with breakfast. 90 tablet 0   No current facility-administered medications for this visit.     Musculoskeletal: Strength & Muscle Tone:  UTA Gait & Station:  Seated Patient leans: N/A  Psychiatric Specialty Exam: Review of Systems  Psychiatric/Behavioral:  The patient is nervous/anxious.     There were no vitals taken for this visit.There is no height or weight on file to calculate BMI.  General Appearance: Casual  Eye Contact:  Fair  Speech:  Clear and Coherent  Volume:  Normal  Mood:  Anxious  Affect:   Congruent  Thought Process:  Goal Directed and Descriptions of Associations: Intact  Orientation:  Full (Time, Place, and Person)  Thought Content: Logical   Suicidal Thoughts:  No  Homicidal Thoughts:  No  Memory:  Immediate;   Fair Recent;   Fair Remote;   Fair  Judgement:  Fair  Insight:  Fair  Psychomotor Activity:  Normal  Concentration:  Concentration: Fair and Attention Span: Fair  Recall:  [employer] of Knowledge: Fair  Language: Fair  Akathisia:  No  Handed:  Right  AIMS (if indicated): not done  Assets:  Communication Skills Desire for Improvement Housing Social Support Transportation  ADL's:  Intact  Cognition: WNL  Sleep:  Fair   Screenings: GAD-7    Flowsheet Row Video Visit from 06/16/2023 in [facility] Office Visit from 03/27/2023 in [facility] Procedure visit from 01/31/2020 in [facility] Office Visit from 01/27/2020 in [facility] Routine Prenatal from 10/10/2019 [facility]  Total GAD-7 Score 3 16 2 19  0      PHQ2-9    Flowsheet Row Video Visit from 06/16/2023 in [facility] Video Visit from 05/01/2023 in [facility] Office Visit from 03/27/2023 in [facility] Office Visit from 02/28/2020 in [facility] Procedure visit from 01/31/2020 in [facility]  PHQ-2 Total Score 0 3 4 0 0  PHQ-9 Total Score -- 9 17 1 1       Flowsheet Row Video Visit from 06/16/2023 in [facility] Video Visit from 05/01/2023 in [facility] Office Visit from 03/27/2023 in [facility]  C-SSRS RISK CATEGORY No Risk No Risk No Risk        Assessment and Plan: Tamara Bishop is a 27 year old Caucasian female who  has a history of MDD, GAD, attention and focus deficit was evaluated by telemedicine today.  Patient is currently improving with regards to her anxiety, depression is currently in remission although  she continues to struggle with attention problems, pending ADHD testing, plan as noted below.  Plan GAD-stable Sertraline 50 mg p.o. daily with breakfast Hydroxyzine 12.5-25 mg p.o. twice daily as needed for severe anxiety attacks Patient was referred for therapy in the past-pending  MDD in remission Sertraline 50 mg p.o. daily   Attention and concentration deficit-unstable Was referred to [facility]-patient reports she has not heard back from them. Agreeable to referral to Dr. Dewitt Hoes health. Ambulatory referral to Dr. Kieth Brightly.  Follow-up in clinic in 3 months or sooner if needed.   Collaboration of Care: Collaboration of Care: Other patient referred for ADHD testing-as noted above.  Patient encouraged to keep the appointment and also to let this provider know if she does not hear back.  Patient/Guardian was advised Release of Information must be obtained prior to any record release in order to collaborate their care with an outside provider. Patient/Guardian was advised if they have not already done so to contact the registration department to sign all necessary forms in order for [place] to release information regarding their care.   Consent: Patient/Guardian gives verbal consent for treatment and assignment of benefits for services provided during this visit. Patient/Guardian expressed understanding and agreed to proceed.   This note was generated in part or whole with voice recognition software. Voice recognition is usually quite accurate but there are transcription errors that can and very often do occur. I apologize for any typographical errors that were not detected and corrected.    Jomarie Longs, MD 06/16/2023, 9:55 AM

## 2023-07-18 ENCOUNTER — Ambulatory Visit
Admission: EM | Admit: 2023-07-18 | Discharge: 2023-07-18 | Disposition: A | Discharge status: Home / Self Care | Payer: Medicaid Other | Attending: Emergency Medicine | Admitting: Emergency Medicine

## 2023-07-18 DIAGNOSIS — R062 Wheezing: Secondary | ICD-10-CM

## 2023-07-18 DIAGNOSIS — J069 Acute upper respiratory infection, unspecified: Secondary | ICD-10-CM | POA: Diagnosis not present

## 2023-07-18 DIAGNOSIS — R0602 Shortness of breath: Secondary | ICD-10-CM | POA: Diagnosis not present

## 2023-07-18 MED ORDER — BENZONATATE 100 MG PO CAPS: 100.0000 mg | ORAL_CAPSULE | Freq: Three times a day (TID) | ORAL | 0 refills | Status: DC

## 2023-07-18 MED ORDER — PREDNISONE 10 MG (21) PO TBPK: ORAL_TABLET | Freq: Every day | ORAL | 0 refills | Status: DC

## 2023-07-18 MED ORDER — IPRATROPIUM-ALBUTEROL 0.5-2.5 (3) MG/3ML IN SOLN
3.0000 mL | Freq: Once | RESPIRATORY_TRACT | Status: AC
  Administered 2023-07-18: 3 mL via RESPIRATORY_TRACT

## 2023-07-18 MED ORDER — METHYLPREDNISOLONE ACETATE 80 MG/ML IJ SUSP
80.0000 mg | Freq: Once | INTRAMUSCULAR | Status: AC
  Administered 2023-07-18: 80 mg via INTRAMUSCULAR

## 2023-07-18 MED ORDER — ALBUTEROL SULFATE HFA 108 (90 BASE) MCG/ACT IN AERS: 2.0000 | INHALATION_SPRAY | Freq: Four times a day (QID) | RESPIRATORY_TRACT | 0 refills | Status: AC | PRN

## 2023-07-18 NOTE — ED Provider Notes (Addendum)
Renaldo Fiddler    CSN: [id number] Arrival date & time: 07/18/23  1238      History   Chief Complaint Chief Complaint  Patient presents with   Wheezing    HPI Tamara Bishop is a 27 y.o. female.   Presents for evaluation of wheezing, shortness of breath, chest tightness, difficulty taking a deep breath that began around 3 AM this morning.  Symptoms started abruptly and have not occurred before.  Has had nasal congestion, nonproductive cough and generalized back pain present for 2 to 3 days, known sick contacts in household with similar symptoms.  Has attempted use of Mucinex.  Denies prior respiratory history.  Former smoker.  Past Medical History:  Diagnosis Date   Abnormal uterine bleeding    Adjustment disorder with anxiety    Allergic rhinitis    Anemia    Anxiety    Cigarette smoker    Depression    Hypertension    Insomnia    Tobacco abuse     Patient Active Problem List   Diagnosis Date Noted   GAD (generalized anxiety disorder) 03/27/2023   Attention and concentration deficit 03/27/2023   Intercostal muscle strain 05/13/2021   Rash and other nonspecific skin eruption 03/22/2021   Lymphadenopathy 03/22/2021   Arthralgia 03/22/2021   Bacterial vaginosis    Vitamin D deficiency 10/16/2018   B12 deficiency 10/16/2018   Low grade 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) 05/01/2018   History of marijuana use 04/10/2018   Acute pharyngitis 07/13/2015   Dyspepsia 06/02/2015   Family history of lupus erythematosus 06/02/2015   Adjustment disorder with anxiety    Allergic rhinitis    Insomnia    Depression 02/16/2015    Past Surgical History:  Procedure Laterality Date   NO PAST SURGERIES     WISDOM TOOTH EXTRACTION      OB History     Gravida  2   Para  2   Term  2   Preterm  0   AB  0   Living  2      SAB  0   IAB  0   Ectopic  0   Multiple  0   Live Births  2            Home Medications     Prior to Admission medications   Medication Sig Start Date End Date Taking? Authorizing Provider  albuterol (VENTOLIN HFA) 108 (90 Base) MCG/ACT inhaler Inhale 2 puffs into the lungs every 6 (six) hours as needed for wheezing or shortness of breath. 07/18/23  Yes Jeriann Sayres R, NP  benzonatate (TESSALON) 100 MG capsule Take 1 capsule (100 mg total) by mouth every 8 (eight) hours. 07/18/23  Yes Belvia Gotschall R, NP  predniSONE (STERAPRED UNI-PAK 21 TAB) 10 MG (21) TBPK tablet Take by mouth daily. Take 6 tabs by mouth daily  for 1 days, then 5 tabs for 1 days, then 4 tabs for 1 days, then 3 tabs for 1 days, 2 tabs for 1 days, then 1 tab by mouth daily for 1 days 07/18/23  Yes Trigger Frasier R, NP  fluconazole (DIFLUCAN) 150 MG tablet Take 150 mg by mouth once. Patient not taking: Reported on 07/18/2023 04/13/23   [provider]  hydrOXYzine (ATARAX) 25 MG tablet Take 0.5-1 tablets (12.5-25 mg total) by mouth 2 (two) times daily as needed for anxiety (and sleep). 05/01/23   Jomarie Longs, MD  medroxyPROGESTERone (DEPO-PROVERA) 150 MG/ML injection  Inject 1 mL (150 mg total) into the muscle every 3 (three) months. 04/12/23   [name], CNM  sertraline (ZOLOFT) 50 MG tablet Take 1 tablet (50 mg total) by mouth daily with breakfast. 05/01/23   Jomarie Longs, MD    Family History Family History  Problem Relation Age of Onset   Lupus Mother    Diabetes Father    Lupus Sister    Heart disease Maternal Grandfather    Breast cancer Maternal Grandmother    Cancer Maternal Grandmother         breast   Diabetes Paternal Grandfather    Diabetes Paternal Grandmother    Ovarian cancer Neg Hx    Colon cancer Neg Hx    Mental illness Neg Hx     Social History Social History   Tobacco Use   Smoking status: Former    Current packs/day: 0.00    Average packs/day: 0.5 packs/day for 1 year (0.5 ttl pk-yrs)    Types: Cigarettes    Start date: 2018    Quit date: 2019    Years  since quitting: 5.9   Smokeless tobacco: Never  Vaping Use   Vaping status: Some Days  Substance Use Topics   Alcohol use: No   Drug use: No     Allergies   Azithromycin   Review of Systems Review of Systems   Physical Exam Triage Vital Signs ED Triage Vitals  Encounter Vitals Group     BP 07/18/23 1248 129/72     Systolic BP Percentile --      Diastolic BP Percentile --      Pulse Rate 07/18/23 1244 (!) 120     Resp 07/18/23 1244 (!) 27     Temp 07/18/23 1248 97.7 F (36.5 C)     Temp src --      SpO2 07/18/23 1244 96 %     Weight --      Height --      Head Circumference --      Peak Flow --      Pain Score 07/18/23 1246 5     Pain Loc --      Pain Education --      Exclude from Growth Chart --    No data found.  Updated Vital Signs BP 129/72   Pulse 98   Temp 97.7 F (36.5 C)   Resp (!) 22   SpO2 98%   Visual Acuity Right Eye Distance:   Left Eye Distance:   Bilateral Distance:    Right Eye Near:   Left Eye Near:    Bilateral Near:     Physical Exam Constitutional:      Appearance: Normal appearance.  HENT:     Right Ear: Tympanic membrane, ear canal and external ear normal.     Left Ear: Tympanic membrane, ear canal and external ear normal.     Nose: Congestion present.     Mouth/Throat:     Pharynx: No oropharyngeal exudate or posterior oropharyngeal erythema.  Eyes:     Extraocular Movements: Extraocular movements intact.  Cardiovascular:     Rate and Rhythm: Normal rate and regular rhythm.     Pulses: Normal pulses.     Heart sounds: Normal heart sounds.  Pulmonary:     Effort: Pulmonary effort is normal.     Breath sounds: Wheezing present.  Neurological:     Mental Status: She is alert and oriented to person, place, and time. Mental status is  at baseline.      UC Treatments / Results  Labs (all labs ordered are listed, but only abnormal results are displayed) Labs Reviewed - No data to display  EKG   Radiology No  results found.  Procedures Procedures (including critical care time)  Medications Ordered in UC Medications  methylPREDNISolone acetate (DEPO-MEDROL) injection 80 mg (80 mg Intramuscular Given 07/18/23 1251)  ipratropium-albuterol (DUONEB) 0.5-2.5 (3) MG/3ML nebulizer solution 3 mL (3 mLs Nebulization Given 07/18/23 1246)  ipratropium-albuterol (DUONEB) 0.5-2.5 (3) MG/3ML nebulizer solution 3 mL (3 mLs Nebulization Given 07/18/23 1316)    Initial Impression / Assessment and Plan / UC Course  I have reviewed the triage vital signs and the nursing notes.  Pertinent labs & imaging results that were available during my care of the patient were reviewed by me and considered in my medical decision making (see chart for details).  Viral URI with cough, wheezing , shortness of breath  Vitals are stable, O2 saturation 98% on room air, patient in respiratory distress unable to complete full sentences and auditory wheezing heard on initial evaluation, methylprednisolone and DuoNeb given, reevaluation symptoms have begun to subside and patient endorses that she is feeling better, waited approximately 10 minutes then repeated DuoNeb, on reevaluation wheezing is now only heard to the left lobes and patient is sitting calmly within room, stable now for outpatient management, prescribed prednisone, Tessalon and albuterol inhaler, advised follow-up if symptoms continue to persist or recur Final Clinical Impressions(s) / UC Diagnoses   Final diagnoses:  Viral URI with cough  Wheezing     Discharge Instructions      Your were evaluated for your difficulty breathing  On exam there is wheezing which is a sign of constriction of the airway  Been given steroids and a breathing treatment here today in the clinic and symptoms have improved  Tomorrow begin prednisone every morning with food as directed to continue to keep the airway open and relaxed  You may use albuterol inhaler taking 2 puffs every 6  hours as needed for shortness of breath or wheezing, can find many videos on social media if having difficulty using inhaler pump   may use Tessalon pill every 8 hours as needed to help calm coughing  You can take Tylenol and/or Ibuprofen as needed for fever reduction and pain relief.   For cough: honey 1/2 to 1 teaspoon (you can dilute the honey in water or another fluid).  You can also use guaifenesin and dextromethorphan for cough. You can use a humidifier for chest congestion and cough.  If you don't have a humidifier, you can sit in the bathroom with the hot shower running.      For sore throat: try warm salt water gargles, cepacol lozenges, throat spray, warm tea or water with lemon/honey, popsicles or ice, or OTC cold relief medicine for throat discomfort.   For congestion: take a daily anti-histamine like Zyrtec, Claritin, and a oral decongestant, such as pseudoephedrine.  You can also use Flonase 1-2 sprays in each nostril daily.   It is important to stay hydrated: drink plenty of fluids (water, gatorade/powerade/pedialyte, juices, or teas) to keep your throat moisturized and help further relieve irritation/discomfort.    ED Prescriptions     Medication Sig Dispense Auth. Provider   predniSONE (STERAPRED UNI-PAK 21 TAB) 10 MG (21) TBPK tablet Take by mouth daily. Take 6 tabs by mouth daily  for 1 days, then 5 tabs for 1 days, then 4 tabs for 1  days, then 3 tabs for 1 days, 2 tabs for 1 days, then 1 tab by mouth daily for 1 days 21 tablet Jovonta Levit R, NP   albuterol (VENTOLIN HFA) 108 (90 Base) MCG/ACT inhaler Inhale 2 puffs into the lungs every 6 (six) hours as needed for wheezing or shortness of breath. 18 g Axyl Sitzman R, NP   benzonatate (TESSALON) 100 MG capsule Take 1 capsule (100 mg total) by mouth every 8 (eight) hours. 21 capsule Jaysa Kise, Elita Boone, NP      PDMP not reviewed this encounter.   Valinda Hoar, NP 07/18/23 1329    Salli Quarry R,  NP 07/18/23 1330

## 2023-07-18 NOTE — ED Triage Notes (Signed)
Patient to Urgent Care with complaints of wheezing/ URI symptoms/ thoracic back pain. Fevers.   Reports her children have been sick w cold symptoms. Patient started having symptoms on Saturday. Woke up at 3am this morning wheezing/ chest tightness/ difficulty breathing.   Taking Mucinex. Denies any respiratory hx.

## 2023-07-18 NOTE — Discharge Instructions (Addendum)
Your were evaluated for your difficulty breathing  On exam there is wheezing which is a sign of constriction of the airway  Been given steroids and a breathing treatment here today in the clinic and symptoms have improved  Tomorrow begin prednisone every morning with food as directed to continue to keep the airway open and relaxed  You may use albuterol inhaler taking 2 puffs every 6 hours as needed for shortness of breath or wheezing, can find many videos on social media if having difficulty using inhaler pump   may use Tessalon pill every 8 hours as needed to help calm coughing  You can take Tylenol and/or Ibuprofen as needed for fever reduction and pain relief.   For cough: honey 1/2 to 1 teaspoon (you can dilute the honey in water or another fluid).  You can also use guaifenesin and dextromethorphan for cough. You can use a humidifier for chest congestion and cough.  If you don't have a humidifier, you can sit in the bathroom with the hot shower running.      For sore throat: try warm salt water gargles, cepacol lozenges, throat spray, warm tea or water with lemon/honey, popsicles or ice, or OTC cold relief medicine for throat discomfort.   For congestion: take a daily anti-histamine like Zyrtec, Claritin, and a oral decongestant, such as pseudoephedrine.  You can also use Flonase 1-2 sprays in each nostril daily.   It is important to stay hydrated: drink plenty of fluids (water, gatorade/powerade/pedialyte, juices, or teas) to keep your throat moisturized and help further relieve irritation/discomfort.

## 2023-08-22 ENCOUNTER — Ambulatory Visit: Payer: Medicaid Other

## 2023-08-25 ENCOUNTER — Ambulatory Visit (INDEPENDENT_AMBULATORY_CARE_PROVIDER_SITE_OTHER): Payer: Medicaid Other

## 2023-08-25 VITALS — BP 120/80 | Ht 62.0 in | Wt 157.3 lb

## 2023-08-25 DIAGNOSIS — Z3042 Encounter for surveillance of injectable contraceptive: Secondary | ICD-10-CM

## 2023-08-25 DIAGNOSIS — Z3202 Encounter for pregnancy test, result negative: Secondary | ICD-10-CM

## 2023-08-25 LAB — POCT URINE PREGNANCY: Preg Test, Ur: NEGATIVE

## 2023-08-25 MED ORDER — MEDROXYPROGESTERONE ACETATE 150 MG/ML IM SUSP
150.0000 mg | Freq: Once | INTRAMUSCULAR | Status: AC
  Administered 2023-08-25: 150 mg via INTRAMUSCULAR

## 2023-08-25 NOTE — Patient Instructions (Signed)
Birth Control Shot: What to Expect A birth control shot prevents pregnancy. A birth control shot is put in (injected) into the skin or a muscle. The shot contains the hormone progestin. Hormones are chemicals that affect how the body works. Progestin prevents pregnancy because it: Stops the ovaries from releasing eggs. Makes cervical mucus thicker. This prevents sperm from getting into the cervix. The cervix is the lowest part of the uterus. Thins the lining of the uterus to prevent a fertilized egg from attaching to the uterus. Tell a health care provider about: Any allergies you have. All medicines you take. These include vitamins, herbs, eye drops, and creams. Any bleeding problems you have. Any medical problems you have. Whether you're pregnant or may be pregnant. What are the risks? Your health care provider will talk with you about risks. These may include: Mood changes or depression. Your bones becoming thinner or weaker. This is called loss of bone density. This can happen if you get the shots for a long period of time. This can cause your bones to break. Blood clots. These are rare. A higher risk of an egg being fertilized outside your uterus, called an ectopic pregnancy.This is rare. What happens before? Your provider may do a physical exam. You may have a test to make sure you aren't pregnant. What happens during a birth control shot?  The area where the shot will be given will be cleaned. A needle will be put into a muscle in your upper arm or butt, or into the skin of your thigh or belly. The needle will be put on a syringe with the medicine in it. The medicine will be pushed through the syringe into your body. A small bandage may be put over the place where the shot was given. What happens after? After the shot, it's common to have: Soreness around the place where the shot was given for a couple of days. Spotting or bleeding between periods. Weight gain. Tender  breasts. Headaches. Belly pain. Ask your provider if you need to use an added method of birth control, such as a condom, sponge, or spermicide. If the first shot is given 1-7 days after the start of your last period, you won't need to use an added method of birth control. If the first shot is given at any other time during your menstrual cycle, you'll need to use an added method of birth control for 7 days after you get the shot. Follow these instructions at home: General instructions Take your medicines only as told. Do not rub or massage the place where the shot was given. Track your periods. This will help you know if they become irregular. Always use a condom to protect against sexually transmitted infections (STIs). Make an appointment in time for your next shot and mark it on your calendar. You must get a shot every 3 months (12-13 weeks) to prevent pregnancy. Lifestyle Do not smoke, vape, or use nicotine or tobacco. Eat foods that are high in calcium and vitamin D, such as milk, cheese, and salmon. Calcium helps keep your bones strong and may help with any loss in bone density caused by the birth control shot. Ask your provider if you should take supplements. Contact a health care provider if you: Have discharge or bleeding from your vagina that isn't normal. Miss a period or think you might be pregnant. Have mood changes or depression. Feel dizzy or light-headed. Have leg pain. Get help right away if you: Have chest pain  or cough up blood. Have trouble breathing. Have a really bad headache that doesn't go away. Have numbness in any part of your body. Have really bad pain in your belly. Have slurred speech or vision problems. These symptoms may be an emergency. Call 911 right away. Do not wait to see if the symptoms will go away. Do not drive yourself to the hospital. This information is not intended to replace advice given to you by your health care provider. Make sure you  discuss any questions you have with your health care provider. Document Revised: 03/23/2023 Document Reviewed: 03/23/2023 Elsevier Patient Education  2024 [employer].

## 2023-08-25 NOTE — Progress Notes (Signed)
    NURSE VISIT NOTE  Subjective:    Patient ID: Tamara Bishop, female    DOB: 13-Jul-1996, 28 y.o.   MRN: [id number]  HPI  Patient is a 28 y.o. [id number] female who presents for depo provera injection. Pt needs a pregnancy test, she missed last depo, she was due 06/14/23.   Objective:    BP 120/80   Ht 5\' 2"  (1.575 m)   Wt 157 lb 4.8 oz (71.4 kg)   BMI 28.77 kg/m   Last Annual: 04/12/23. Last pap: 04/12/23. Last Depo-Provera: 03/15/23. Side Effects if any: none. Urine HCG indicated? Yes . Depo-Provera 150 mg IM given by: Cornelius Moras, CMA. Site: Left Ventrogluteal  Lab Review  @THIS  VISIT ONLY@  Assessment:   1. Encounter for management and injection of depo-Provera      Plan:   Next appointment due between 11/10/23 and 11/24/23.    Cornelius Moras, CMA

## 2023-09-15 ENCOUNTER — Telehealth: Payer: Medicaid Other | Admitting: Psychiatry

## 2023-09-15 ENCOUNTER — Encounter: Payer: Self-pay | Admitting: Psychiatry

## 2023-09-15 DIAGNOSIS — R41840 Attention and concentration deficit: Secondary | ICD-10-CM

## 2023-09-15 DIAGNOSIS — F3342 Major depressive disorder, recurrent, in full remission: Secondary | ICD-10-CM | POA: Diagnosis not present

## 2023-09-15 DIAGNOSIS — F411 Generalized anxiety disorder: Secondary | ICD-10-CM | POA: Diagnosis not present

## 2023-09-15 MED ORDER — SERTRALINE HCL 50 MG PO TABS: 50.0000 mg | ORAL_TABLET | Freq: Every day | ORAL | 0 refills | Status: DC

## 2023-09-15 NOTE — Progress Notes (Signed)
Virtual Visit via Video Note  I connected with Tamara Bishop on 09/15/23 at 11:20 AM EST by a video enabled telemedicine application and verified that I am speaking with the correct person using two identifiers.  Location Provider Location : [facility] Patient Location : Home  Participants: Patient , Provider    I discussed the limitations of evaluation and management by telemedicine and the availability of in person appointments. The patient expressed understanding and agreed to proceed.   I discussed the assessment and treatment plan with the patient. The patient was provided an opportunity to ask questions and all were answered. The patient agreed with the plan and demonstrated an understanding of the instructions.   The patient was advised to call back or seek an in-person evaluation if the symptoms worsen or if the condition fails to improve as anticipated.   [facility] MD OP Progress Note  09/15/2023 3:06 PM Tamara Bishop  MRN:  [id number]  Chief Complaint:  Chief Complaint  Patient presents with   Anxiety   Medication Refill   Depression   HPI: Tamara Bishop is a 28 year old Caucasian female, engaged, stay-at-home mom, lives in [place], has a history of GAD, MDD, attention and concentration deficit was evaluated by telemedicine today.  She is experiencing difficulties with the insurance process for ADHD testing, as her current insurance, [employer], may not cover the $400 cost. She has been attempting to contact Medicaid for confirmation but has faced challenges with long wait times and call disconnections.  Her mood is stable on her current medications. She experiences occasional overwhelm due to caring for her two children, aged three and four, but otherwise has no concerns. She uses hydroxyzine as needed, approximately once or twice a week, and takes sertraline daily. She requires a refill for sertraline as her current supply is running low.  She has started  engaging in activities such as promoting products on TikTok, which provides her with a creative outlet. She also takes her children for outdoor activities like hiking when the weather permits.  She does not have a formal therapist but has access to informal support through her church. She is open to being referred to a therapist for more structured support.  Denies thoughts of self-harm or harm to others.  Visit Diagnosis:    ICD-10-CM   1. GAD (generalized anxiety disorder)  F41.1 sertraline (ZOLOFT) 50 MG tablet    2. MDD (major depressive disorder), recurrent, in full remission (HCC)  F33.42     3. Attention and concentration deficit  R41.840 sertraline (ZOLOFT) 50 MG tablet      Past Psychiatric History: I have reviewed past psychiatric history from progress note on 03/27/2023.  Past trials of Lexapro-noncompliant, Paxil-side effect.  Past Medical History:  Past Medical History:  Diagnosis Date   Abnormal uterine bleeding    Adjustment disorder with anxiety    Allergic rhinitis    Anemia    Anxiety    Cigarette smoker    Depression    Hypertension    Insomnia    Tobacco abuse     Past Surgical History:  Procedure Laterality Date   NO PAST SURGERIES     WISDOM TOOTH EXTRACTION      Family Psychiatric History: I have reviewed family psychiatric history from progress note on 03/27/2023.  Family History:  Family History  Problem Relation Age of Onset   Lupus Mother    Diabetes Father    Lupus Sister    Heart  disease Maternal Grandfather    Breast cancer Maternal Grandmother    Cancer Maternal Grandmother         breast   Diabetes Paternal Grandfather    Diabetes Paternal Grandmother    Ovarian cancer Neg Hx    Colon cancer Neg Hx    Mental illness Neg Hx     Social History: I have reviewed social history from progress note on 03/27/2023. Social History   Socioeconomic History   Marital status: Significant Other    Spouse name: Ernestina Patches   Number of  children: 2   Years of education: Not on file   Highest education level: High school graduate  Occupational History   Not on file  Tobacco Use   Smoking status: Former    Current packs/day: 0.00    Average packs/day: 0.5 packs/day for 1 year (0.5 ttl pk-yrs)    Types: Cigarettes    Start date: 2018    Quit date: 2019    Years since quitting: 6.1   Smokeless tobacco: Never  Vaping Use   Vaping status: Some Days  Substance and Sexual Activity   Alcohol use: No   Drug use: No   Sexual activity: Yes    Birth control/protection: Injection    Comment: depo  Other Topics Concern   Not on file  Social History Narrative   Not on file   Social Drivers of Health   Financial Resource Strain: Low Risk  (10/16/2018)   Overall Financial Resource Strain (CARDIA)    Difficulty of Paying Living Expenses: Not hard at all  Food Insecurity: No Food Insecurity (10/06/2022)   Hunger Vital Sign    Worried About Running Out of Food in the Last Year: Never true    Ran Out of Food in the Last Year: Never true  Transportation Needs: No Transportation Needs (10/06/2022)   PRAPARE - Administrator, Civil Service (Medical): No    Lack of Transportation (Non-Medical): No  Physical Activity: Unknown (10/16/2018)   Exercise Vital Sign    Days of Exercise per Week: 5 days    Minutes of Exercise per Session: Not on file  Stress: No Stress Concern Present (10/16/2018)   [employer] of Occupational Health - Occupational Stress Questionnaire    Feeling of Stress : Not at all  Social Connections: Socially Integrated (10/16/2018)   Social Connection and Isolation Panel [NHANES]    Frequency of Communication with Friends and Family: More than three times a week    Frequency of Social Gatherings with Friends and Family: More than three times a week    Attends Religious Services: More than 4 times per year    Active Member of [employer] or Organizations: Yes    Attends Banker Meetings:  Never    Marital Status: Living with partner    Allergies:  Allergies  Allergen Reactions   Azithromycin Other (See Comments)    Felt like her stomach was going to come out of her body    Metabolic Disorder Labs: No results found for: "HGBA1C", "MPG" Lab Results  Component Value Date   PROLACTIN 10.6 01/12/2023   Lab Results  Component Value Date   CHOL 131 07/05/2022   TRIG 70 07/05/2022   HDL 38 (L) 07/05/2022   CHOLHDL 3.4 07/05/2022   LDLCALC 79 07/05/2022   LDLCALC 78 06/02/2015   Lab Results  Component Value Date   TSH 0.957 01/12/2023   TSH 1.250 04/16/2019    Therapeutic Level  Labs: No results found for: "LITHIUM" No results found for: "VALPROATE" No results found for: "CBMZ"  Current Medications: Current Outpatient Medications  Medication Sig Dispense Refill   albuterol (VENTOLIN HFA) 108 (90 Base) MCG/ACT inhaler Inhale 2 puffs into the lungs every 6 (six) hours as needed for wheezing or shortness of breath. 18 g 0   benzonatate (TESSALON) 100 MG capsule Take 1 capsule (100 mg total) by mouth every 8 (eight) hours. 21 capsule 0   fluconazole (DIFLUCAN) 150 MG tablet Take 150 mg by mouth once.     hydrOXYzine (ATARAX) 25 MG tablet Take 0.5-1 tablets (12.5-25 mg total) by mouth 2 (two) times daily as needed for anxiety (and sleep). 180 tablet 1   medroxyPROGESTERone (DEPO-PROVERA) 150 MG/ML injection Inject 1 mL (150 mg total) into the muscle every 3 (three) months. 1 mL 3   predniSONE (STERAPRED UNI-PAK 21 TAB) 10 MG (21) TBPK tablet Take by mouth daily. Take 6 tabs by mouth daily  for 1 days, then 5 tabs for 1 days, then 4 tabs for 1 days, then 3 tabs for 1 days, 2 tabs for 1 days, then 1 tab by mouth daily for 1 days 21 tablet 0   sertraline (ZOLOFT) 50 MG tablet Take 1 tablet (50 mg total) by mouth daily with breakfast. 90 tablet 0   No current facility-administered medications for this visit.     Musculoskeletal: Strength & Muscle Tone:  UTA Gait &  Station: normal Patient leans: N/A  Psychiatric Specialty Exam: Review of Systems  Psychiatric/Behavioral:  Positive for decreased concentration.     There were no vitals taken for this visit.There is no height or weight on file to calculate BMI.  General Appearance: Casual  Eye Contact:  Fair  Speech:  Clear and Coherent  Volume:  Normal  Mood:  Euthymic  Affect:  Congruent  Thought Process:  Goal Directed and Descriptions of Associations: Intact  Orientation:  Full (Time, Place, and Person)  Thought Content: Logical   Suicidal Thoughts:  No  Homicidal Thoughts:  No  Memory:  Immediate;   Fair Recent;   Fair Remote;   Fair  Judgement:  Fair  Insight:  Fair  Psychomotor Activity:  Normal  Concentration:  Concentration: Fair and Attention Span: Fair  Recall:  [employer] of Knowledge: Fair  Language: Fair  Akathisia:  No  Handed:  Right  AIMS (if indicated): not done  Assets:  Desire for Improvement Housing Social Support  ADL's:  Intact  Cognition: WNL  Sleep:  Fair   Screenings: GAD-7    Flowsheet Row Video Visit from 06/16/2023 in [facility] Office Visit from 03/27/2023 in [facility] Procedure visit from 01/31/2020 in [facility] Office Visit from 01/27/2020 in [facility] Routine Prenatal from 10/10/2019 in [facility]  Total GAD-7 Score 3 16 2 19  0      PHQ2-9    Flowsheet Row Video Visit from 06/16/2023 in [facility] Video Visit from 05/01/2023 in [facility] Office Visit from 03/27/2023 in [facility] Office Visit from 02/28/2020 in [facility] Procedure visit from 01/31/2020 in [facility]  PHQ-2 Total Score 0 3 4 0 0  PHQ-9 Total Score -- 9 17 1 1       Flowsheet Row Video Visit from 09/15/2023 in [facility] ED from 07/18/2023  in [facility]  Video Visit from 06/16/2023 in [facility]  C-SSRS RISK CATEGORY No Risk No Risk No Risk        Assessment and Plan: Tamara Bishop is a 28 year old Caucasian female who has a history of MDD, GAD, attention and concentration deficit was evaluated by telemedicine today.  Discussed assessment and plan as noted below.  Major Depressive Disorder in remission Reports no new concerns or changes in mood. Requires a refill of the medication. - Continue Sertraline 50 mg daily with breakfast  Generalized Anxiety Disorder-stable Reports occasional use of hydroxyzine, approximately once or twice a week, indicating manageable anxiety levels.  - Continue Hydroxyzine 12.5-25 mg twice a day as needed - Refer to in-office therapist for further support, with the next available appointment in April or May  Attention and concentration deficit--unstable Referred for ADHD testing.  Patient referred to Dr. Kieth Brightly. - Contact Medicaid for coverage information - Explore [facility] in [place] for potential Medicaid acceptance - Inform office of any Medicaid referrals for proper ADHD testing   Follow-up - Schedule follow-up virtual visit on May 23 at 11:20 AM - Coordinate with therapist for appointment scheduling  Collaboration of Care: Collaboration of Care: Referral or follow-up with counselor/therapist AEB I have communicated with staff to schedule this patient with in-house therapist.  Patient/Guardian was advised Release of Information must be obtained prior to any record release in order to collaborate their care with an outside provider. Patient/Guardian was advised if they have not already done so to contact the registration department to sign all necessary forms in order for [place] to release information regarding their care.   Consent:  Patient/Guardian gives verbal consent for treatment and assignment of benefits for services provided during this visit. Patient/Guardian expressed understanding and agreed to proceed.   This note was generated in part or whole with voice recognition software. Voice recognition is usually quite accurate but there are transcription errors that can and very often do occur. I apologize for any typographical errors that were not detected and corrected.    Jomarie Longs, MD 09/15/2023, 3:06 PM

## 2023-11-03 ENCOUNTER — Encounter: Payer: Self-pay | Admitting: Certified Nurse Midwife

## 2023-11-17 ENCOUNTER — Ambulatory Visit: Payer: Medicaid Other

## 2023-11-17 ENCOUNTER — Ambulatory Visit: Payer: Medicaid Other | Admitting: Professional Counselor

## 2023-11-17 DIAGNOSIS — F329 Major depressive disorder, single episode, unspecified: Secondary | ICD-10-CM | POA: Diagnosis not present

## 2023-11-17 DIAGNOSIS — F411 Generalized anxiety disorder: Secondary | ICD-10-CM

## 2023-11-17 DIAGNOSIS — F331 Major depressive disorder, recurrent, moderate: Secondary | ICD-10-CM
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
## 2023-11-20 ENCOUNTER — Ambulatory Visit

## 2023-11-24 ENCOUNTER — Ambulatory Visit: Admitting: Certified Nurse Midwife

## 2023-11-24 VITALS — BP 127/84 | HR 71 | Ht 61.5 in | Wt 158.2 lb

## 2023-11-24 DIAGNOSIS — R635 Abnormal weight gain: Secondary | ICD-10-CM | POA: Diagnosis not present

## 2023-11-24 DIAGNOSIS — R232 Flushing: Secondary | ICD-10-CM | POA: Diagnosis not present

## 2023-11-24 DIAGNOSIS — Z3009 Encounter for other general counseling and advice on contraception: Secondary | ICD-10-CM

## 2023-11-24 MED ORDER — NORETHIN ACE-ETH ESTRAD-FE 1-20 MG-MCG PO TABS: 1.0000 | ORAL_TABLET | Freq: Every day | ORAL | 3 refills | Status: AC
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
## 2023-11-25 LAB — FSH/LH
FSH: 7.4 m[IU]/mL
LH: 4.7 m[IU]/mL

## 2023-11-25 LAB — ESTRADIOL: Estradiol: 46.3 pg/mL

## 2023-11-25 LAB — TSH+FREE T4
Free T4: 1.38 ng/dL (ref 0.82–1.77)
TSH: 2.49 u[IU]/mL (ref 0.450–4.500)

## 2023-11-25 LAB — TESTOSTERONE: Testosterone: 23 ng/dL (ref 13–71)

## 2023-11-25 LAB — CORTISOL: Cortisol: 15.9 ug/dL (ref 6.2–19.4)

## 2023-11-26 ENCOUNTER — Encounter: Payer: Self-pay | Admitting: Certified Nurse Midwife

## 2023-12-22 ENCOUNTER — Telehealth (INDEPENDENT_AMBULATORY_CARE_PROVIDER_SITE_OTHER): Payer: Medicaid Other | Admitting: Psychiatry

## 2023-12-22 ENCOUNTER — Encounter: Payer: Self-pay | Admitting: Psychiatry

## 2023-12-22 DIAGNOSIS — R41840 Attention and concentration deficit: Secondary | ICD-10-CM | POA: Diagnosis not present

## 2023-12-22 DIAGNOSIS — F3342 Major depressive disorder, recurrent, in full remission: Secondary | ICD-10-CM | POA: Insufficient documentation

## 2023-12-22 DIAGNOSIS — F411 Generalized anxiety disorder: Secondary | ICD-10-CM

## 2023-12-22 MED ORDER — SERTRALINE HCL 50 MG PO TABS: 50.0000 mg | ORAL_TABLET | Freq: Every day | ORAL | 1 refills | Status: DC

## 2023-12-22 NOTE — Progress Notes (Signed)
 Virtual Visit via Video Note  I connected with Tamara Bishop on 12/22/23 at 11:20 AM EDT by a video enabled telemedicine application and verified that I am speaking with the correct person using two identifiers.  Location Provider Location : [facility] Patient Location : Home  Participants: Patient , Provider    I discussed the limitations of evaluation and management by telemedicine and the availability of in person appointments. The patient expressed understanding and agreed to proceed.   I discussed the assessment and treatment plan with the patient. The patient was provided an opportunity to ask questions and all were answered. The patient agreed with the plan and demonstrated an understanding of the instructions.   The patient was advised to call back or seek an in-person evaluation if the symptoms worsen or if the condition fails to improve as anticipated.  BH MD OP Progress Note  12/22/2023 1:22 PM Tamara Bishop  MRN:  [id number]  Chief Complaint:  Chief Complaint  Patient presents with   Follow-up   Anxiety   Depression   Medication Refill   Discussed the use of AI scribe software for clinical note transcription with the patient, who gave verbal consent to proceed.  History of Present Illness Tamara Bishop is a 28 year old Caucasian female, engaged, stay-at-home mom, lives in [place], has a history of GAD, MDD, attention and concentration deficit was evaluated by telemedicine today.  Her anxiety is well-managed with her current medication regimen. She is taking Zoloft  50 mg daily and is compliant with her medication. She requests a refill for Zoloft .  She recently switched from Depo-Provera  to birth control pills and has been on them for four weeks. Initially, she experienced mood changes during the first week, but reports improvement since then. She has not had a menstrual cycle in five years and is awaiting changes with the new birth control.  Her sleep  pattern includes going to bed around midnight and waking up at 7 AM, but she sometimes returns to sleep until 9 AM if her children are still asleep. She feels she sleeps too much but attributes it to her current schedule.  She is awaiting ADHD testing and is on a waiting list for an appointment with Dr. Minerva Alvine office.  She is also using hydroxyzine  as needed and that has been beneficial with episodic anxiety which are more situational.  She denies any suicidality, homicidality or perceptual disturbances.  She is scheduled to continue to follow up with her therapist Ms. Deetta Farrow and has multiple appointment scheduled in June.  She is motivated to stay in therapy.  She denies any other concerns today.      Visit Diagnosis:    ICD-10-CM   1. GAD (generalized anxiety disorder)  F41.1 sertraline  (ZOLOFT ) 50 MG tablet    2. MDD (major depressive disorder), recurrent, in full remission (HCC)  F33.42     3. Attention and concentration deficit  R41.840 sertraline  (ZOLOFT ) 50 MG tablet      Past Psychiatric History: I have reviewed past psychiatric history from progress note on 03/27/2023.  Past trials of Lexapro -noncompliant, Paxil -side effect  Past Medical History:  Past Medical History:  Diagnosis Date   Abnormal uterine bleeding    Adjustment disorder with anxiety    Allergic rhinitis    Anemia    Anxiety    Cigarette smoker    Depression    Hypertension    Insomnia    Tobacco abuse     Past Surgical History:  Procedure Laterality Date   NO PAST SURGERIES     WISDOM TOOTH EXTRACTION      Family Psychiatric History: I have reviewed family psychiatric history from progress note on 03/27/2023.  Family History:  Family History  Problem Relation Age of Onset   Lupus Mother    Diabetes Father    Lupus Sister    Heart disease Maternal Grandfather    Breast cancer Maternal Grandmother    Cancer Maternal Grandmother         breast   Diabetes Paternal Grandfather     Diabetes Paternal Grandmother    Ovarian cancer Neg Hx    Colon cancer Neg Hx    Mental illness Neg Hx     Social History: I have reviewed social history from progress note on 03/27/2023. Social History   Socioeconomic History   Marital status: Significant Other    Spouse name: Laban Pia   Number of children: 2   Years of education: Not on file   Highest education level: High school graduate  Occupational History   Not on file  Tobacco Use   Smoking status: Former    Current packs/day: 0.00    Average packs/day: 0.5 packs/day for 1 year (0.5 ttl pk-yrs)    Types: Cigarettes    Start date: 2018    Quit date: 2019    Years since quitting: 6.3   Smokeless tobacco: Never  Vaping Use   Vaping status: Some Days  Substance and Sexual Activity   Alcohol use: No   Drug use: No   Sexual activity: Yes    Birth control/protection: Injection    Comment: depo  Other Topics Concern   Not on file  Social History Narrative   Not on file   Social Drivers of Health   Financial Resource Strain: Low Risk  (11/17/2023)   Overall Financial Resource Strain (CARDIA)    Difficulty of Paying Living Expenses: Not hard at all  Food Insecurity: No Food Insecurity (11/17/2023)   Hunger Vital Sign    Worried About Running Out of Food in the Last Year: Never true    Ran Out of Food in the Last Year: Never true  Transportation Needs: No Transportation Needs (11/17/2023)   PRAPARE - Administrator, Civil Service (Medical): No    Lack of Transportation (Non-Medical): No  Physical Activity: Sufficiently Active (11/17/2023)   Exercise Vital Sign    Days of Exercise per Week: 5 days    Minutes of Exercise per Session: 60 min  Stress: Stress Concern Present (11/17/2023)   [employer] of Occupational Health - Occupational Stress Questionnaire    Feeling of Stress : Rather much  Social Connections: Moderately Integrated (11/17/2023)   Social Connection and Isolation Panel [NHANES]     Frequency of Communication with Friends and Family: Three times a week    Frequency of Social Gatherings with Friends and Family: Never    Attends Religious Services: More than 4 times per year    Active Member of [employer] or Organizations: No    Attends Banker Meetings: Never    Marital Status: Living with partner    Allergies:  Allergies  Allergen Reactions   Azithromycin  Other (See Comments)    Felt like her stomach was going to come out of her body    Metabolic Disorder Labs: No results found for: "HGBA1C", "MPG" Lab Results  Component Value Date   PROLACTIN 10.6 01/12/2023   Lab Results  Component Value  Date   CHOL 131 07/05/2022   TRIG 70 07/05/2022   HDL 38 (L) 07/05/2022   CHOLHDL 3.4 07/05/2022   LDLCALC 79 07/05/2022   LDLCALC 78 06/02/2015   Lab Results  Component Value Date   TSH 2.490 11/24/2023   TSH 0.957 01/12/2023    Therapeutic Level Labs: No results found for: "LITHIUM" No results found for: "VALPROATE" No results found for: "CBMZ"  Current Medications: Current Outpatient Medications  Medication Sig Dispense Refill   albuterol  (VENTOLIN  HFA) 108 (90 Base) MCG/ACT inhaler Inhale 2 puffs into the lungs every 6 (six) hours as needed for wheezing or shortness of breath. 18 g 0   hydrOXYzine  (ATARAX ) 25 MG tablet Take 0.5-1 tablets (12.5-25 mg total) by mouth 2 (two) times daily as needed for anxiety (and sleep). 180 tablet 1   norethindrone-ethinyl estradiol -FE (LOESTRIN FE) 1-20 MG-MCG tablet Take 1 tablet by mouth daily. 90 tablet 3   sertraline  (ZOLOFT ) 50 MG tablet Take 1 tablet (50 mg total) by mouth daily with breakfast. 90 tablet 1   No current facility-administered medications for this visit.     Musculoskeletal: Strength & Muscle Tone: UTA Gait & Station: Seated Patient leans: N/A  Psychiatric Specialty Exam: Review of Systems  Psychiatric/Behavioral:  Positive for decreased concentration.     There were no vitals taken  for this visit.There is no height or weight on file to calculate BMI.  General Appearance: Casual  Eye Contact:  Fair  Speech:  Clear and Coherent  Volume:  Normal  Mood:  Euthymic  Affect:  Congruent  Thought Process:  Goal Directed and Descriptions of Associations: Intact  Orientation:  Full (Time, Place, and Person)  Thought Content: Logical   Suicidal Thoughts:  No  Homicidal Thoughts:  No  Memory:  Immediate;   Fair Recent;   Fair Remote;   Fair  Judgement:  Fair  Insight:  Fair  Psychomotor Activity:  Normal  Concentration:  Concentration: Fair and Attention Span: Fair  Recall:  [employer] of Knowledge: Fair  Language: Fair  Akathisia:  No  Handed:  Right  AIMS (if indicated): not done  Assets:  Communication Skills Desire for Improvement Housing Social Support Talents/Skills Transportation  ADL's:  Intact  Cognition: WNL  Sleep:  Fair   Screenings: GAD-7    Advertising copywriter from 11/17/2023 in [facility] Video Visit from 06/16/2023 in [facility] Office Visit from 03/27/2023 in [facility] Procedure visit from 01/31/2020 in [facility] Office Visit from 01/27/2020 in [facility]  Total GAD-7 Score 15 3 16 2 19       PHQ2-9    Flowsheet Row Counselor from 11/17/2023 in [facility] Video Visit from 06/16/2023 in [facility] Video Visit from 05/01/2023 in [facility] Office Visit from 03/27/2023 in [facility] Office Visit from 02/28/2020 in [facility]  PHQ-2 Total Score 3 0 3 4 0  PHQ-9 Total Score 14 -- 9 17 1       Flowsheet Row Video Visit from 12/22/2023 in [facility] Counselor from 11/17/2023 in [facility] Video Visit from 09/15/2023 in [facility]  C-SSRS RISK CATEGORY No Risk No Risk No Risk        Assessment and Plan: Shirley Douglas  is a 28 year old Caucasian female who has a history of depression, anxiety, attention and focus deficit was evaluated by telemedicine today.  Discussed assessment and plan as noted below.  Major depression in remission Currently reports depression symptoms as well managed on sertraline .  Has been compliant on the sertraline  and denies side effects.  Continues to be motivated to stay in therapy with Ms. Deetta Farrow. - Continue Sertraline  50 mg daily with breakfast - Continue CBT with Ms. Deetta Farrow.  Generalized anxiety disorder-stable Denies any significant anxiety symptoms at this time. - Continue Hydroxyzine  12.5-25 mg twice a day as needed - Continue CBT  Attention and concentration deficit-unstable Continues to struggle with attention and focus.  Has not been able to get ADHD testing completed yet. - Agrees to get in touch with Dr. Minerva Alvine office to which she was referred to.   Follow-up Follow-up in clinic in 4 months or sooner if needed.    Collaboration of Care: Collaboration of Care: Referral or follow-up with counselor/therapist AEB encouraged to continue CBT with Ms. Deetta Farrow has upcoming appointment.  Encouraged to follow up with Dr. Cheryll Corti patient was referred to them for ADHD testing.  Patient/Guardian was advised Release of Information must be obtained prior to any record release in order to collaborate their care with an outside provider. Patient/Guardian was advised if they have not already done so to contact the registration department to sign all necessary forms in order for us  to release information regarding their care.   Consent: Patient/Guardian gives verbal consent for treatment and assignment of benefits for services provided during this visit.  Patient/Guardian expressed understanding and agreed to proceed.  This note was generated in part or whole with voice recognition software. Voice recognition is usually quite accurate but there are transcription errors that can and very often do occur. I apologize for any typographical errors that were not detected and corrected.     Tyaisha Cullom, MD 12/22/2023, 1:22 PM

## 2024-01-05 ENCOUNTER — Ambulatory Visit (INDEPENDENT_AMBULATORY_CARE_PROVIDER_SITE_OTHER): Admitting: Professional Counselor

## 2024-01-05 DIAGNOSIS — F411 Generalized anxiety disorder: Secondary | ICD-10-CM

## 2024-01-05 DIAGNOSIS — F331 Major depressive disorder, recurrent, moderate: Secondary | ICD-10-CM

## 2024-01-05 DIAGNOSIS — R41840 Attention and concentration deficit: Secondary | ICD-10-CM
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
## 2024-01-08 ENCOUNTER — Encounter: Payer: Self-pay | Admitting: Certified Nurse Midwife

## 2024-01-08 MED ORDER — FLUCONAZOLE 150 MG PO TABS: 150.0000 mg | ORAL_TABLET | Freq: Once | ORAL | 0 refills | Status: AC

## 2024-01-12 ENCOUNTER — Ambulatory Visit (INDEPENDENT_AMBULATORY_CARE_PROVIDER_SITE_OTHER): Admitting: Professional Counselor

## 2024-01-12 DIAGNOSIS — F331 Major depressive disorder, recurrent, moderate: Secondary | ICD-10-CM | POA: Diagnosis not present

## 2024-01-12 DIAGNOSIS — F411 Generalized anxiety disorder: Secondary | ICD-10-CM | POA: Diagnosis not present
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
## 2024-01-19 ENCOUNTER — Ambulatory Visit (INDEPENDENT_AMBULATORY_CARE_PROVIDER_SITE_OTHER): Admitting: Professional Counselor

## 2024-01-19 DIAGNOSIS — R41840 Attention and concentration deficit: Secondary | ICD-10-CM

## 2024-01-19 DIAGNOSIS — F331 Major depressive disorder, recurrent, moderate: Secondary | ICD-10-CM

## 2024-01-19 DIAGNOSIS — F411 Generalized anxiety disorder: Secondary | ICD-10-CM | POA: Diagnosis not present

## 2024-01-19 NOTE — Progress Notes (Unsigned)
  THERAPIST PROGRESS NOTE  Virtual Visit via Video Note  I connected with Tamara Bishop on 01/19/24 at 11:00 AM EDT by a video enabled telemedicine application and verified that I am speaking with the correct person using two identifiers.  Location: Patient: Home Provider: Remote office   I discussed the limitations of evaluation and management by telemedicine and the availability of in person appointments. The patient expressed understanding and agreed to proceed.   I discussed the assessment and treatment plan with the patient. The patient was provided an opportunity to ask questions and all were answered. The patient agreed with the plan and demonstrated an understanding of the instructions.   The patient was advised to call back or seek an in-person evaluation if the symptoms worsen or if the condition fails to improve as anticipated.  I provided *** minutes of non-face-to-face time during this encounter. Len Quale, [facility]  Session Time: 11:01 AM -   Participation Level: {BHH PARTICIPATION LEVEL:22264}  Behavioral Response: {Appearance:22683}{BHH LEVEL OF CONSCIOUSNESS:22305}{BHH MOOD:22306}  Type of Therapy: {CHL AMB BH Type of Therapy:21022741}  Treatment Goals addressed: ***  ProgressTowards Goals: {Progress Towards Goals:21014066}  Interventions: {CHL AMB BH Type of Intervention:21022753}  Summary: Tamara Bishop is a 28 y.o. female who presents with ***.   Suicidal/Homicidal: {BHH YES OR NO:22294}{yes/no/with/without intent/plan:22693}  Therapist Response: ***  Plan: Return again in *** weeks.  Diagnosis: No diagnosis found.  Collaboration of Care: {BH OP Collaboration of Care:21014065}  Patient/Guardian was advised Release of Information must be obtained prior to any record release in order to collaborate their care with an outside provider. Patient/Guardian was advised if they have not already done so to contact the registration department to  sign all necessary forms in order for us  to release information regarding their care.   Consent: Patient/Guardian gives verbal consent for treatment and assignment of benefits for services provided during this visit. Patient/Guardian expressed understanding and agreed to proceed.   Len Quale, [facility] 01/19/2024

## 2024-01-23 ENCOUNTER — Ambulatory Visit

## 2024-01-23 ENCOUNTER — Other Ambulatory Visit (HOSPITAL_COMMUNITY)
Admission: RE | Admit: 2024-01-23 | Discharge: 2024-01-23 | Disposition: A | Discharge status: Home / Self Care | Source: Ambulatory Visit | Attending: Certified Nurse Midwife | Admitting: Certified Nurse Midwife

## 2024-01-23 VITALS — BP 117/88 | HR 97 | Ht 61.0 in | Wt 158.5 lb

## 2024-01-23 DIAGNOSIS — Z202 Contact with and (suspected) exposure to infections with a predominantly sexual mode of transmission: Secondary | ICD-10-CM | POA: Insufficient documentation

## 2024-01-23 DIAGNOSIS — N898 Other specified noninflammatory disorders of vagina: Secondary | ICD-10-CM | POA: Diagnosis present

## 2024-01-23 DIAGNOSIS — N9489 Other specified conditions associated with female genital organs and menstrual cycle: Secondary | ICD-10-CM | POA: Diagnosis present

## 2024-01-23 NOTE — Progress Notes (Signed)
    NURSE VISIT NOTE  Subjective:    Patient ID: Tamara Bishop, female    DOB: [birth year], 28 y.o.   MRN: [id number]  HPI  Patient is a 28 y.o. G50P2002 female who presents for clear vaginal discharge for 2 day(s). Denies abnormal vaginal bleeding or significant pelvic pain or fever. denies dysuria and urinary frequency. Patient has history of known exposure to STD.   Objective:    BP 117/88   Pulse 97   Ht 5' 1 (1.549 m)   Wt 158 lb 8 oz (71.9 kg)   BMI 29.95 kg/m    @THIS  VISIT ONLY@  Assessment:   1. Vaginal burning   2. Vaginal itching     trichomonas  Plan:   GC and chlamydia DNA  probe sent to lab. Treatment: abstain from coitus during course of treatment ROV prn if symptoms persist or worsen.   Mathis LITTIE Getting, CMA

## 2024-01-25 ENCOUNTER — Ambulatory Visit: Payer: Self-pay | Admitting: Obstetrics

## 2024-01-25 LAB — CERVICOVAGINAL ANCILLARY ONLY
Bacterial Vaginitis (gardnerella): NEGATIVE
Candida Glabrata: NEGATIVE
Candida Vaginitis: NEGATIVE
Chlamydia: NEGATIVE
Comment: NEGATIVE
Comment: NEGATIVE
Comment: NEGATIVE
Comment: NEGATIVE
Comment: NEGATIVE
Comment: NORMAL
Neisseria Gonorrhea: NEGATIVE
Trichomonas: NEGATIVE

## 2024-02-09 ENCOUNTER — Ambulatory Visit: Admitting: Obstetrics

## 2024-02-13 ENCOUNTER — Ambulatory Visit: Admitting: Professional Counselor

## 2024-02-13 DIAGNOSIS — F331 Major depressive disorder, recurrent, moderate: Secondary | ICD-10-CM

## 2024-02-13 DIAGNOSIS — F411 Generalized anxiety disorder: Secondary | ICD-10-CM

## 2024-02-13 DIAGNOSIS — R41840 Attention and concentration deficit: Secondary | ICD-10-CM | POA: Diagnosis not present
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
## 2024-02-28 ENCOUNTER — Ambulatory Visit: Admitting: Professional Counselor

## 2024-03-11 ENCOUNTER — Ambulatory Visit (INDEPENDENT_AMBULATORY_CARE_PROVIDER_SITE_OTHER): Admitting: Professional Counselor

## 2024-03-11 DIAGNOSIS — F411 Generalized anxiety disorder: Secondary | ICD-10-CM

## 2024-03-11 DIAGNOSIS — F331 Major depressive disorder, recurrent, moderate: Secondary | ICD-10-CM

## 2024-03-11 DIAGNOSIS — R41840 Attention and concentration deficit: Secondary | ICD-10-CM | POA: Diagnosis not present
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
## 2024-03-23 ENCOUNTER — Other Ambulatory Visit: Payer: Self-pay | Admitting: Psychiatry

## 2024-03-23 DIAGNOSIS — F411 Generalized anxiety disorder: Secondary | ICD-10-CM

## 2024-03-25 ENCOUNTER — Ambulatory Visit: Admitting: Professional Counselor

## 2024-04-12 ENCOUNTER — Ambulatory Visit (INDEPENDENT_AMBULATORY_CARE_PROVIDER_SITE_OTHER): Admitting: Professional Counselor

## 2024-04-12 DIAGNOSIS — F411 Generalized anxiety disorder: Secondary | ICD-10-CM

## 2024-04-12 DIAGNOSIS — R41840 Attention and concentration deficit: Secondary | ICD-10-CM

## 2024-04-12 DIAGNOSIS — F3342 Major depressive disorder, recurrent, in full remission: Secondary | ICD-10-CM
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
## 2024-04-19 ENCOUNTER — Encounter: Payer: Self-pay | Admitting: Psychiatry

## 2024-04-19 ENCOUNTER — Telehealth: Admitting: Psychiatry

## 2024-04-19 DIAGNOSIS — F3342 Major depressive disorder, recurrent, in full remission: Secondary | ICD-10-CM | POA: Diagnosis not present

## 2024-04-19 DIAGNOSIS — F411 Generalized anxiety disorder: Secondary | ICD-10-CM

## 2024-04-19 NOTE — Progress Notes (Signed)
 Virtual Visit via Video Note  I connected with Tamara Bishop on 04/19/24 at 11:00 AM EDT by a video enabled telemedicine application and verified that I am speaking with the correct person using two identifiers.  Location Provider Location : [facility] Patient Location : Home  Participants: Patient , Provider    I discussed the limitations of evaluation and management by telemedicine and the availability of in person appointments. The patient expressed understanding and agreed to proceed.  I discussed the assessment and treatment plan with the patient. The patient was provided an opportunity to ask questions and all were answered. The patient agreed with the plan and demonstrated an understanding of the instructions.   The patient was advised to call back or seek an in-person evaluation if the symptoms worsen or if the condition fails to improve as anticipated.   BH MD OP Progress Note  04/19/2024 11:10 AM WYLIE RUSSON  MRN:  [id number]  Chief Complaint:  Chief Complaint  Patient presents with   Follow-up   Anxiety   Depression   Medication Refill   Discussed the use of AI scribe software for clinical note transcription with the patient, who gave verbal consent to proceed.  History of Present Illness Tamara Bishop is a 28 year old Caucasian female, engaged, stay-at-home mom, currently lives in [place], has a history of GAD, MDD, attention and focus deficit was evaluated by telemedicine today.  She reports stable mood and no significant mood symptoms during the day since her last visit. Ongoing psychosocial stressors related to moving and home renovations, including recent relocation and continued unpacking and floor work with her partner, contribute to a sense of chaos in her living environment.  She states that she stopped taking sertraline  (Zoloft ) because it caused gastrointestinal discomfort. She continues to take hydroxyzine  at night and reports that this has helped  her manage without experiencing daytime mood symptoms. She confirms that she has not had any thoughts of harming herself or others.  Uncertainty about pursuing ADHD testing leads her to express ambivalence and some anxiety about the process. She has not completed the testing and remains undecided about whether to proceed.  She denies any other concerns today.     Visit Diagnosis:    ICD-10-CM   1. GAD (generalized anxiety disorder)  F41.1     2. MDD (major depressive disorder), recurrent, in full remission (HCC)  F33.42       Past Psychiatric History: I have reviewed past psychiatric history from progress note on 03/27/2023.  Past trials of Lexapro -noncompliant, Paxil -side effects.  Past Medical History:  Past Medical History:  Diagnosis Date   Abnormal uterine bleeding    Adjustment disorder with anxiety    Allergic rhinitis    Anemia    Anxiety    Cigarette smoker    Depression    Hypertension    Insomnia    Tobacco abuse     Past Surgical History:  Procedure Laterality Date   NO PAST SURGERIES     WISDOM TOOTH EXTRACTION      Family Psychiatric History: I have reviewed family psychiatric history from progress note on 03/27/2023.  Family History:  Family History  Problem Relation Age of Onset   Lupus Mother    Diabetes Father    Lupus Sister    Heart disease Maternal Grandfather    Breast cancer Maternal Grandmother    Cancer Maternal Grandmother         breast   Diabetes Paternal Grandfather  Diabetes Paternal Grandmother    Ovarian cancer Neg Hx    Colon cancer Neg Hx    Mental illness Neg Hx     Social History: I have reviewed social history from progress note on 03/27/2023. Social History   Socioeconomic History   Marital status: Significant Other    Spouse name: Beverley Barrio   Number of children: 2   Years of education: Not on file   Highest education level: High school graduate  Occupational History   Not on file  Tobacco Use   Smoking  status: Former    Current packs/day: 0.00    Average packs/day: 0.5 packs/day for 1 year (0.5 ttl pk-yrs)    Types: Cigarettes    Start date: 2018    Quit date: 2019    Years since quitting: 6.7   Smokeless tobacco: Never  Vaping Use   Vaping status: Some Days  Substance and Sexual Activity   Alcohol use: No   Drug use: No   Sexual activity: Yes    Birth control/protection: Injection    Comment: depo  Other Topics Concern   Not on file  Social History Narrative   Not on file   Social Drivers of Health   Financial Resource Strain: Low Risk  (11/17/2023)   Overall Financial Resource Strain (CARDIA)    Difficulty of Paying Living Expenses: Not hard at all  Food Insecurity: No Food Insecurity (11/17/2023)   Hunger Vital Sign    Worried About Running Out of Food in the Last Year: Never true    Ran Out of Food in the Last Year: Never true  Transportation Needs: No Transportation Needs (11/17/2023)   PRAPARE - Administrator, Civil Service (Medical): No    Lack of Transportation (Non-Medical): No  Physical Activity: Sufficiently Active (11/17/2023)   Exercise Vital Sign    Days of Exercise per Week: 5 days    Minutes of Exercise per Session: 60 min  Stress: Stress Concern Present (11/17/2023)   [employer] of Occupational Health - Occupational Stress Questionnaire    Feeling of Stress : Rather much  Social Connections: Moderately Integrated (11/17/2023)   Social Connection and Isolation Panel    Frequency of Communication with Friends and Family: Three times a week    Frequency of Social Gatherings with Friends and Family: Never    Attends Religious Services: More than 4 times per year    Active Member of [employer] or Organizations: No    Attends Banker Meetings: Never    Marital Status: Living with partner    Allergies:  Allergies  Allergen Reactions   Azithromycin  Other (See Comments)    Felt like her stomach was going to come out of her body     Metabolic Disorder Labs: No results found for: HGBA1C, MPG Lab Results  Component Value Date   PROLACTIN 10.6 01/12/2023   Lab Results  Component Value Date   CHOL 131 07/05/2022   TRIG 70 07/05/2022   HDL 38 (L) 07/05/2022   CHOLHDL 3.4 07/05/2022   LDLCALC 79 07/05/2022   LDLCALC 78 06/02/2015   Lab Results  Component Value Date   TSH 2.490 11/24/2023   TSH 0.957 01/12/2023    Therapeutic Level Labs: No results found for: LITHIUM No results found for: VALPROATE No results found for: CBMZ  Current Medications: Current Outpatient Medications  Medication Sig Dispense Refill   albuterol  (VENTOLIN  HFA) 108 (90 Base) MCG/ACT inhaler Inhale 2 puffs into the lungs  every 6 (six) hours as needed for wheezing or shortness of breath. 18 g 0   hydrOXYzine  (ATARAX ) 25 MG tablet TAKE 1/2 TO 1 (ONE-HALF TO ONE) TABLET BY MOUTH TWICE DAILY AS NEEDED FOR ANXIETY (AND  SLEEP) 180 tablet 0   norethindrone-ethinyl estradiol -FE (LOESTRIN FE) 1-20 MG-MCG tablet Take 1 tablet by mouth daily. 90 tablet 3   No current facility-administered medications for this visit.     Musculoskeletal: Strength & Muscle Tone: UTA Gait & Station: Seated Patient leans: N/A  Psychiatric Specialty Exam: Review of Systems  Psychiatric/Behavioral: Negative.      There were no vitals taken for this visit.There is no height or weight on file to calculate BMI.  General Appearance: Casual  Eye Contact:  Fair  Speech:  Clear and Coherent  Volume:  Normal  Mood:  Euthymic  Affect:  Congruent  Thought Process:  Goal Directed and Descriptions of Associations: Intact  Orientation:  Full (Time, Place, and Person)  Thought Content: Logical   Suicidal Thoughts:  No  Homicidal Thoughts:  No  Memory:  Immediate;   Fair Recent;   Fair Remote;   Fair  Judgement:  Fair  Insight:  Fair  Psychomotor Activity:  Normal  Concentration:  Concentration: Fair and Attention Span: Fair  Recall:  [employer]  of Knowledge: Fair  Language: Fair  Akathisia:  No  Handed:  Right  AIMS (if indicated): not done  Assets:  Communication Skills Desire for Improvement Housing Social Support Transportation  ADL's:  Intact  Cognition: WNL  Sleep:  Fair   Screenings: GAD-7    Advertising copywriter from 11/17/2023 in [facility] Video Visit from 06/16/2023 in [facility] Office Visit from 03/27/2023 in [facility] Procedure visit from 01/31/2020 in [facility] Office Visit from 01/27/2020 in [facility] Care  Total GAD-7 Score 15 3 16 2 19    PHQ2-9    Flowsheet Row Counselor from 11/17/2023 in [facility] Video Visit from 06/16/2023 in [facility] Video Visit from 05/01/2023 in [facility] Office Visit from 03/27/2023 in [facility] Office Visit from 02/28/2020 in [facility]  PHQ-2 Total Score 3 0 3 4 0  PHQ-9 Total Score 14 -- 9 17 1    Flowsheet Row Video Visit from 04/19/2024 in [facility] Video Visit from 12/22/2023 in [facility] Counselor from 11/17/2023 in [facility]  C-SSRS RISK CATEGORY No Risk No Risk No Risk     Assessment and Plan: BONITA BRINDISI is a 28 year old Caucasian female who has a history of depression, anxiety, attention and focus deficit was evaluated by telemedicine today.  Discussed assessment and plan as noted below.  1. GAD (generalized anxiety disorder)-stable Stopped taking sertraline  due to side effects of GI problems.  Currently taking hydroxyzine  which has been beneficial.  Not interested in making any further medication changes today since mood  symptoms has been stable Continue Hydroxyzine  12.5-25 mg twice a day as needed Continue CBT with Ms. Veva as needed  2. MDD (major depressive disorder), recurrent, in full remission (HCC) Denies any significant depression symptoms.  Stopped taking sertraline  due to GI side effects. Discontinue Sertraline .    Follow-up Follow-up in clinic in 3 months or sooner if needed.    Collaboration of  Care: Collaboration of Care: Referral or follow-up with counselor/therapist AEB encouraged to continue CBT  Patient/Guardian was advised Release of Information must be obtained prior to any record release in order to collaborate their care with an outside provider. Patient/Guardian was advised if they have not already done so to contact the registration department to sign all necessary forms in order for us  to release information regarding their care.   Consent: Patient/Guardian gives verbal consent for treatment and assignment of benefits for services provided during this visit. Patient/Guardian expressed understanding and agreed to proceed.   This note was generated in part or whole with voice recognition software. Voice recognition is usually quite accurate but there are transcription errors that can and very often do occur. I apologize for any typographical errors that were not detected and corrected.    Adalida Garver, MD 04/21/2024, 6:38 PM

## 2024-07-19 ENCOUNTER — Encounter: Payer: Self-pay | Admitting: Psychiatry

## 2024-07-19 ENCOUNTER — Telehealth (INDEPENDENT_AMBULATORY_CARE_PROVIDER_SITE_OTHER): Admitting: Psychiatry

## 2024-07-19 DIAGNOSIS — F3342 Major depressive disorder, recurrent, in full remission: Secondary | ICD-10-CM | POA: Diagnosis not present

## 2024-07-19 DIAGNOSIS — F411 Generalized anxiety disorder: Secondary | ICD-10-CM | POA: Diagnosis not present

## 2024-07-19 NOTE — Progress Notes (Unsigned)
 Virtual Visit via Video Note  I connected with Tamara Bishop on 07/19/2024 at 11:20 AM EST by a video enabled telemedicine application and verified that I am speaking with the correct person using two identifiers.  Location Provider Location : [facility] Patient Location : Home  Participants: Patient , Provider    I discussed the limitations of evaluation and management by telemedicine and the availability of in person appointments. The patient expressed understanding and agreed to proceed.   I discussed the assessment and treatment plan with the patient. The patient was provided an opportunity to ask questions and all were answered. The patient agreed with the plan and demonstrated an understanding of the instructions.   The patient was advised to call back or seek an in-person evaluation if the symptoms worsen or if the condition fails to improve as anticipated.  [facility] MD OP Progress Note  07/19/2024 11:39 AM Tamara Bishop  MRN:  [id number]  Chief Complaint:  Chief Complaint  Patient presents with   Follow-up   Medication Refill   Anxiety   Depression   Discussed the use of AI scribe software for clinical note transcription with the patient, who gave verbal consent to proceed.  History of Present Illness Tamara Bishop is a 28 year old Caucasian female, engaged, stay-at-home mom, currently lives in [place], has a history of GAD, MDD, attention and focus deficit was evaluated by telemedicine today.  Since her last appointment, she reports stable mood and describes feeling good overall. She denies experiencing significant anxiety, mood swings, or depression. She uses hydroxyzine  as needed, without a set schedule, and only occasionally. She confirms discontinuing sertraline  prior to this visit and currently takes hydroxyzine  as needed for anxiety. She notes not seeing her therapist for a few months but previously attended therapy.  She denies any thoughts of harming herself or  others.  She enjoys baking and making dehydrated strawberries using an air fryer. She is preparing cookie boxes and strawberry lemonade cake truffles as part of holiday activities.    Visit Diagnosis:    ICD-10-CM   1. GAD (generalized anxiety disorder)  F41.1     2. MDD (major depressive disorder), recurrent, in full remission  F33.42       Past Psychiatric History: I have reviewed past psychiatric history from progress note on 03/27/2023.  Past trials of Lexapro -noncompliant, Paxil -side effects.  Past Medical History:  Past Medical History:  Diagnosis Date   Abnormal uterine bleeding    Adjustment disorder with anxiety    Allergic rhinitis    Anemia    Anxiety    Cigarette smoker    Depression    Hypertension    Insomnia    Tobacco abuse     Past Surgical History:  Procedure Laterality Date   NO PAST SURGERIES     WISDOM TOOTH EXTRACTION      Family Psychiatric History: I have reviewed family psychiatric history from progress note on 03/27/2023.  Family History:  Family History  Problem Relation Age of Onset   Lupus Mother    Diabetes Father    Lupus Sister    Heart disease Maternal Grandfather    Breast cancer Maternal Grandmother    Cancer Maternal Grandmother         breast   Diabetes Paternal Grandfather    Diabetes Paternal Grandmother    Ovarian cancer Neg Hx    Colon cancer Neg Hx    Mental illness Neg Hx     Social History: I have  reviewed social history from progress note on 03/27/2023. Social History   Socioeconomic History   Marital status: Significant Other    Spouse name: Beverley Barrio   Number of children: 2   Years of education: Not on file   Highest education level: High school graduate  Occupational History   Not on file  Tobacco Use   Smoking status: Former    Current packs/day: 0.00    Average packs/day: 0.5 packs/day for 1 year (0.5 ttl pk-yrs)    Types: Cigarettes    Start date: 2018    Quit date: 2019    Years since  quitting: 6.9   Smokeless tobacco: Never  Vaping Use   Vaping status: Some Days  Substance and Sexual Activity   Alcohol use: No   Drug use: No   Sexual activity: Yes    Birth control/protection: Injection    Comment: depo  Other Topics Concern   Not on file  Social History Narrative   Not on file   Social Drivers of Health   Tobacco Use: Medium Risk (07/19/2024)   Patient History    Smoking Tobacco Use: Former    Smokeless Tobacco Use: Never    Passive Exposure: Not on Actuary Strain: Low Risk (11/17/2023)   Overall Financial Resource Strain (CARDIA)    Difficulty of Paying Living Expenses: Not hard at all  Food Insecurity: No Food Insecurity (11/17/2023)   Hunger Vital Sign    Worried About Running Out of Food in the Last Year: Never true    Ran Out of Food in the Last Year: Never true  Transportation Needs: No Transportation Needs (11/17/2023)   PRAPARE - Administrator, Civil Service (Medical): No    Lack of Transportation (Non-Medical): No  Physical Activity: Sufficiently Active (11/17/2023)   Exercise Vital Sign    Days of Exercise per Week: 5 days    Minutes of Exercise per Session: 60 min  Stress: Stress Concern Present (11/17/2023)   [employer] of Occupational Health - Occupational Stress Questionnaire    Feeling of Stress : Rather much  Social Connections: Moderately Integrated (11/17/2023)   Social Connection and Isolation Panel    Frequency of Communication with Friends and Family: Three times a week    Frequency of Social Gatherings with Friends and Family: Never    Attends Religious Services: More than 4 times per year    Active Member of Clubs or Organizations: No    Attends Banker Meetings: Never    Marital Status: Living with partner  Depression (PHQ2-9): High Risk (11/17/2023)   Depression (PHQ2-9)    PHQ-2 Score: 14  Alcohol Screen: Low Risk (11/17/2023)   Alcohol Screen    Last Alcohol Screening Score  (AUDIT): 1  Housing: Low Risk (11/17/2023)   Housing Stability Vital Sign    Unable to Pay for Housing in the Last Year: No    Number of Times Moved in the Last Year: 0    Homeless in the Last Year: No  Utilities: Not At Risk (11/17/2023)   AHC Utilities    Threatened with loss of utilities: No  Health Literacy: Inadequate Health Literacy (11/17/2023)   B1300 Health Literacy    Frequency of need for help with medical instructions: Always    Allergies: Allergies[1]  Metabolic Disorder Labs: No results found for: HGBA1C, MPG Lab Results  Component Value Date   PROLACTIN 10.6 01/12/2023   Lab Results  Component Value Date  CHOL 131 07/05/2022   TRIG 70 07/05/2022   HDL 38 (L) 07/05/2022   CHOLHDL 3.4 07/05/2022   LDLCALC 79 07/05/2022   LDLCALC 78 06/02/2015   Lab Results  Component Value Date   TSH 2.490 11/24/2023   TSH 0.957 01/12/2023    Therapeutic Level Labs: No results found for: LITHIUM No results found for: VALPROATE No results found for: CBMZ  Current Medications: Current Outpatient Medications  Medication Sig Dispense Refill   albuterol  (VENTOLIN  HFA) 108 (90 Base) MCG/ACT inhaler Inhale 2 puffs into the lungs every 6 (six) hours as needed for wheezing or shortness of breath. 18 g 0   hydrOXYzine  (ATARAX ) 25 MG tablet TAKE 1/2 TO 1 (ONE-HALF TO ONE) TABLET BY MOUTH TWICE DAILY AS NEEDED FOR ANXIETY (AND  SLEEP) 180 tablet 0   norethindrone-ethinyl estradiol -FE (LOESTRIN FE) 1-20 MG-MCG tablet Take 1 tablet by mouth daily. 90 tablet 3   No current facility-administered medications for this visit.     Musculoskeletal: Strength & Muscle Tone: UTA Gait & Station: Normal Patient leans: N/A  Psychiatric Specialty Exam: Review of Systems  Psychiatric/Behavioral: Negative.      There were no vitals taken for this visit.There is no height or weight on file to calculate BMI.  General Appearance: Casual  Eye Contact:  Fair  Speech:  Clear and  Coherent  Volume:  Normal  Mood:  Euthymic  Affect:  Congruent  Thought Process:  Goal Directed and Descriptions of Associations: Intact  Orientation:  Full (Time, Place, and Person)  Thought Content: Logical   Suicidal Thoughts:  No  Homicidal Thoughts:  No  Memory:  Immediate;   Fair Recent;   Fair Remote;   Fair  Judgement:  Fair  Insight:  Fair  Psychomotor Activity:  Normal  Concentration:  Concentration: Fair and Attention Span: Fair  Recall:  [employer] of Knowledge: Fair  Language: Fair  Akathisia:  No  Handed:  Right  AIMS (if indicated): not done  Assets:  Communication Skills Desire for Improvement Housing Social Support  ADL's:  Intact  Cognition: WNL  Sleep:  Fair   Screenings: GAD-7    Advertising Copywriter from 11/17/2023 in [facility] Video Visit from 06/16/2023 in [facility] Office Visit from 03/27/2023 [facility] Procedure visit from 01/31/2020 in [facility] Office Visit from 01[facility]27/2020 in [facility]  Total GAD-7 Score 15 3 16 2 19    PHQ2-9    Flowsheet Row Counselor from 11/17/2023 in [facility] Video Visit from 06/16/2023 in [facility] Video Visit from 05/01/2023 in [facility] Office Visit from 03/27/2023 in [facility] Office Visit from 02/28/2020 in [facility]  PHQ-2 Total Score 3 0 3 4 0  PHQ-9 Total Score 14 -- 9 17 1    Flowsheet Row Video Visit from 04/19/2024 in [facility] Video Visit from 12/22/2023 in [facility] Counselor from 11/17/2023 in [facility]  C-SSRS RISK CATEGORY No Risk No Risk No Risk      Assessment and Plan: Tamara Bishop is a 28 year old Caucasian female who presented for a follow-up appointment, discussed assessment and plan as noted below.  1. GAD (generalized anxiety disorder)-stable Currently denies any significant anxiety symptoms Continue Hydroxyzine  12.5-25 mg twice a day  as needed Continue CBT with Ms.Gainey as needed  2. MDD (major depressive disorder), recurrent, in full remission Denies any depression symptoms Continue CBT as needed  Follow-up Follow-up in clinic in 5 to 6 months or sooner in person.    Collaboration of Care: Collaboration of Care: Primary Care Provider AEB discussed with patient regarding transition of care back to primary care provider/OB/GYN who she reports is her primary care.  Patient however would like to hold this off.  Will reevaluate at her next visit.  Patient/Guardian was advised Release of Information must be obtained prior to any record release in order to collaborate their care with an outside provider. Patient/Guardian was advised if they have not already done so to contact the registration department to sign all necessary forms in order for us  to release information regarding their care.   Consent: Patient/Guardian gives verbal consent for treatment and assignment of benefits for services provided during this visit. Patient/Guardian expressed understanding and agreed to proceed.   This note was generated in part or whole with voice recognition software. Voice recognition is usually quite accurate but there are transcription errors that can and very often do occur. I apologize for any typographical errors that were not detected and corrected.    Ta Fair, MD 07/19/2024, 11:39 AM     [1]  Allergies Allergen Reactions   Azithromycin  Other (See Comments)    Felt like her stomach was going to come out of her body

## 2024-12-31 ENCOUNTER — Ambulatory Visit: Admitting: Psychiatry
